# Patient Record
Sex: Female | Born: 1940
Health system: Southern US, Community
[De-identification: ages and names within clinical notes are randomized; demographics above are authoritative.]

## PROBLEM LIST (undated history)

## (undated) DIAGNOSIS — R519 Headache, unspecified: Secondary | ICD-10-CM

## (undated) DIAGNOSIS — F32A Depression, unspecified: Secondary | ICD-10-CM

## (undated) DIAGNOSIS — I712 Thoracic aortic aneurysm, without rupture: Secondary | ICD-10-CM

## (undated) DIAGNOSIS — C801 Malignant (primary) neoplasm, unspecified: Secondary | ICD-10-CM

## (undated) DIAGNOSIS — R51 Headache: Secondary | ICD-10-CM

## (undated) DIAGNOSIS — J309 Allergic rhinitis, unspecified: Secondary | ICD-10-CM

## (undated) DIAGNOSIS — F419 Anxiety disorder, unspecified: Secondary | ICD-10-CM

## (undated) DIAGNOSIS — K219 Gastro-esophageal reflux disease without esophagitis: Secondary | ICD-10-CM

## (undated) DIAGNOSIS — F329 Major depressive disorder, single episode, unspecified: Secondary | ICD-10-CM

## (undated) DIAGNOSIS — M1990 Unspecified osteoarthritis, unspecified site: Secondary | ICD-10-CM

## (undated) DIAGNOSIS — G43909 Migraine, unspecified, not intractable, without status migrainosus: Secondary | ICD-10-CM

## (undated) DIAGNOSIS — I10 Essential (primary) hypertension: Secondary | ICD-10-CM

## (undated) DIAGNOSIS — Z9289 Personal history of other medical treatment: Secondary | ICD-10-CM

## (undated) DIAGNOSIS — S42009A Fracture of unspecified part of unspecified clavicle, initial encounter for closed fracture: Secondary | ICD-10-CM

## (undated) DIAGNOSIS — M810 Age-related osteoporosis without current pathological fracture: Secondary | ICD-10-CM

## (undated) HISTORY — DX: Gastro-esophageal reflux disease without esophagitis: K21.9

## (undated) HISTORY — DX: Depression, unspecified: F32.A

## (undated) HISTORY — DX: Unspecified osteoarthritis, unspecified site: M19.90

## (undated) HISTORY — DX: Depression, unspecified: F41.9

## (undated) HISTORY — DX: Headache: R51

## (undated) HISTORY — DX: Essential (primary) hypertension: I10

## (undated) HISTORY — DX: Malignant (primary) neoplasm, unspecified: C80.1

## (undated) HISTORY — DX: Age-related osteoporosis without current pathological fracture: M81.0

## (undated) HISTORY — DX: Migraine, unspecified, not intractable, without status migrainosus: G43.909

## (undated) HISTORY — DX: Thoracic aortic aneurysm, without rupture: I71.2

## (undated) HISTORY — DX: Allergic rhinitis, unspecified: J30.9

## (undated) HISTORY — DX: Headache, unspecified: R51.9

## (undated) HISTORY — DX: Fracture of unspecified part of unspecified clavicle, initial encounter for closed fracture: S42.009A

## (undated) HISTORY — DX: Major depressive disorder, single episode, unspecified: F32.9

## (undated) HISTORY — DX: Personal history of other medical treatment: Z92.89

---
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
## 1998-07-06 HISTORY — PX: ROTATOR CUFF REPAIR: SHX139

## 2005-04-03 ENCOUNTER — Other Ambulatory Visit: Admission: RE | Admit: 2005-04-03 | Discharge: 2005-04-03 | Payer: Self-pay | Admitting: Family Medicine

## 2005-07-06 HISTORY — PX: TOTAL HIP ARTHROPLASTY: SHX124

## 2013-08-08 DIAGNOSIS — C44319 Basal cell carcinoma of skin of other parts of face: Secondary | ICD-10-CM | POA: Diagnosis not present

## 2013-08-11 DIAGNOSIS — R279 Unspecified lack of coordination: Secondary | ICD-10-CM | POA: Diagnosis not present

## 2013-08-11 DIAGNOSIS — F329 Major depressive disorder, single episode, unspecified: Secondary | ICD-10-CM | POA: Diagnosis not present

## 2013-08-11 DIAGNOSIS — M542 Cervicalgia: Secondary | ICD-10-CM | POA: Diagnosis not present

## 2013-08-11 DIAGNOSIS — M479 Spondylosis, unspecified: Secondary | ICD-10-CM | POA: Diagnosis not present

## 2013-08-11 DIAGNOSIS — I10 Essential (primary) hypertension: Secondary | ICD-10-CM | POA: Diagnosis not present

## 2013-08-11 DIAGNOSIS — F3289 Other specified depressive episodes: Secondary | ICD-10-CM | POA: Diagnosis not present

## 2013-08-11 DIAGNOSIS — Z0000 Encounter for general adult medical examination without abnormal findings: Secondary | ICD-10-CM | POA: Diagnosis not present

## 2013-08-15 DIAGNOSIS — F3289 Other specified depressive episodes: Secondary | ICD-10-CM | POA: Diagnosis not present

## 2013-08-15 DIAGNOSIS — F329 Major depressive disorder, single episode, unspecified: Secondary | ICD-10-CM | POA: Diagnosis not present

## 2013-09-05 DIAGNOSIS — F3289 Other specified depressive episodes: Secondary | ICD-10-CM | POA: Diagnosis not present

## 2013-09-05 DIAGNOSIS — F329 Major depressive disorder, single episode, unspecified: Secondary | ICD-10-CM | POA: Diagnosis not present

## 2013-09-19 DIAGNOSIS — L819 Disorder of pigmentation, unspecified: Secondary | ICD-10-CM | POA: Diagnosis not present

## 2013-09-19 DIAGNOSIS — D235 Other benign neoplasm of skin of trunk: Secondary | ICD-10-CM | POA: Diagnosis not present

## 2013-09-19 DIAGNOSIS — Z85828 Personal history of other malignant neoplasm of skin: Secondary | ICD-10-CM | POA: Diagnosis not present

## 2013-09-27 DIAGNOSIS — F3289 Other specified depressive episodes: Secondary | ICD-10-CM | POA: Diagnosis not present

## 2013-09-27 DIAGNOSIS — F329 Major depressive disorder, single episode, unspecified: Secondary | ICD-10-CM | POA: Diagnosis not present

## 2013-10-09 DIAGNOSIS — R269 Unspecified abnormalities of gait and mobility: Secondary | ICD-10-CM | POA: Diagnosis not present

## 2013-10-09 DIAGNOSIS — E119 Type 2 diabetes mellitus without complications: Secondary | ICD-10-CM | POA: Diagnosis not present

## 2013-10-09 DIAGNOSIS — Z96649 Presence of unspecified artificial hip joint: Secondary | ICD-10-CM | POA: Diagnosis not present

## 2013-10-09 DIAGNOSIS — R29898 Other symptoms and signs involving the musculoskeletal system: Secondary | ICD-10-CM | POA: Diagnosis not present

## 2013-10-09 DIAGNOSIS — I10 Essential (primary) hypertension: Secondary | ICD-10-CM | POA: Diagnosis not present

## 2013-10-09 DIAGNOSIS — G609 Hereditary and idiopathic neuropathy, unspecified: Secondary | ICD-10-CM | POA: Diagnosis not present

## 2013-10-09 DIAGNOSIS — M531 Cervicobrachial syndrome: Secondary | ICD-10-CM | POA: Diagnosis not present

## 2013-10-09 DIAGNOSIS — M25559 Pain in unspecified hip: Secondary | ICD-10-CM | POA: Diagnosis not present

## 2013-10-24 DIAGNOSIS — F329 Major depressive disorder, single episode, unspecified: Secondary | ICD-10-CM | POA: Diagnosis not present

## 2013-10-24 DIAGNOSIS — F3289 Other specified depressive episodes: Secondary | ICD-10-CM | POA: Diagnosis not present

## 2013-11-07 DIAGNOSIS — R42 Dizziness and giddiness: Secondary | ICD-10-CM | POA: Diagnosis not present

## 2013-11-07 DIAGNOSIS — G9009 Other idiopathic peripheral autonomic neuropathy: Secondary | ICD-10-CM | POA: Diagnosis not present

## 2013-11-07 DIAGNOSIS — R269 Unspecified abnormalities of gait and mobility: Secondary | ICD-10-CM | POA: Diagnosis not present

## 2013-11-13 DIAGNOSIS — R42 Dizziness and giddiness: Secondary | ICD-10-CM | POA: Diagnosis not present

## 2013-11-13 DIAGNOSIS — G9009 Other idiopathic peripheral autonomic neuropathy: Secondary | ICD-10-CM | POA: Diagnosis not present

## 2013-11-13 DIAGNOSIS — R269 Unspecified abnormalities of gait and mobility: Secondary | ICD-10-CM | POA: Diagnosis not present

## 2013-11-16 DIAGNOSIS — R269 Unspecified abnormalities of gait and mobility: Secondary | ICD-10-CM | POA: Diagnosis not present

## 2013-11-16 DIAGNOSIS — G9009 Other idiopathic peripheral autonomic neuropathy: Secondary | ICD-10-CM | POA: Diagnosis not present

## 2013-11-16 DIAGNOSIS — R42 Dizziness and giddiness: Secondary | ICD-10-CM | POA: Diagnosis not present

## 2013-11-20 DIAGNOSIS — G9009 Other idiopathic peripheral autonomic neuropathy: Secondary | ICD-10-CM | POA: Diagnosis not present

## 2013-11-20 DIAGNOSIS — R42 Dizziness and giddiness: Secondary | ICD-10-CM | POA: Diagnosis not present

## 2013-11-20 DIAGNOSIS — R269 Unspecified abnormalities of gait and mobility: Secondary | ICD-10-CM | POA: Diagnosis not present

## 2013-11-22 DIAGNOSIS — H353290 Exudative age-related macular degeneration, unspecified eye, stage unspecified: Secondary | ICD-10-CM | POA: Diagnosis not present

## 2013-11-22 DIAGNOSIS — H2510 Age-related nuclear cataract, unspecified eye: Secondary | ICD-10-CM | POA: Diagnosis not present

## 2013-11-22 DIAGNOSIS — H40039 Anatomical narrow angle, unspecified eye: Secondary | ICD-10-CM | POA: Diagnosis not present

## 2013-11-22 DIAGNOSIS — L719 Rosacea, unspecified: Secondary | ICD-10-CM | POA: Diagnosis not present

## 2013-11-22 DIAGNOSIS — F329 Major depressive disorder, single episode, unspecified: Secondary | ICD-10-CM | POA: Diagnosis not present

## 2013-11-22 DIAGNOSIS — F3289 Other specified depressive episodes: Secondary | ICD-10-CM | POA: Diagnosis not present

## 2013-11-23 DIAGNOSIS — R269 Unspecified abnormalities of gait and mobility: Secondary | ICD-10-CM | POA: Diagnosis not present

## 2013-11-23 DIAGNOSIS — R42 Dizziness and giddiness: Secondary | ICD-10-CM | POA: Diagnosis not present

## 2013-11-23 DIAGNOSIS — G9009 Other idiopathic peripheral autonomic neuropathy: Secondary | ICD-10-CM | POA: Diagnosis not present

## 2013-11-28 DIAGNOSIS — G9009 Other idiopathic peripheral autonomic neuropathy: Secondary | ICD-10-CM | POA: Diagnosis not present

## 2013-11-28 DIAGNOSIS — R269 Unspecified abnormalities of gait and mobility: Secondary | ICD-10-CM | POA: Diagnosis not present

## 2013-11-28 DIAGNOSIS — R42 Dizziness and giddiness: Secondary | ICD-10-CM | POA: Diagnosis not present

## 2013-11-29 DIAGNOSIS — H2510 Age-related nuclear cataract, unspecified eye: Secondary | ICD-10-CM | POA: Diagnosis not present

## 2013-11-29 DIAGNOSIS — L719 Rosacea, unspecified: Secondary | ICD-10-CM | POA: Diagnosis not present

## 2013-11-29 DIAGNOSIS — H04129 Dry eye syndrome of unspecified lacrimal gland: Secondary | ICD-10-CM | POA: Diagnosis not present

## 2013-11-29 DIAGNOSIS — H353290 Exudative age-related macular degeneration, unspecified eye, stage unspecified: Secondary | ICD-10-CM | POA: Diagnosis not present

## 2013-11-30 DIAGNOSIS — H353290 Exudative age-related macular degeneration, unspecified eye, stage unspecified: Secondary | ICD-10-CM | POA: Diagnosis not present

## 2013-11-30 DIAGNOSIS — H353190 Nonexudative age-related macular degeneration, unspecified eye, stage unspecified: Secondary | ICD-10-CM | POA: Diagnosis not present

## 2013-12-05 DIAGNOSIS — G9009 Other idiopathic peripheral autonomic neuropathy: Secondary | ICD-10-CM | POA: Diagnosis not present

## 2013-12-05 DIAGNOSIS — R42 Dizziness and giddiness: Secondary | ICD-10-CM | POA: Diagnosis not present

## 2013-12-05 DIAGNOSIS — R269 Unspecified abnormalities of gait and mobility: Secondary | ICD-10-CM | POA: Diagnosis not present

## 2013-12-07 DIAGNOSIS — G9009 Other idiopathic peripheral autonomic neuropathy: Secondary | ICD-10-CM | POA: Diagnosis not present

## 2013-12-07 DIAGNOSIS — R269 Unspecified abnormalities of gait and mobility: Secondary | ICD-10-CM | POA: Diagnosis not present

## 2013-12-07 DIAGNOSIS — R42 Dizziness and giddiness: Secondary | ICD-10-CM | POA: Diagnosis not present

## 2013-12-13 DIAGNOSIS — H04129 Dry eye syndrome of unspecified lacrimal gland: Secondary | ICD-10-CM | POA: Diagnosis not present

## 2013-12-13 DIAGNOSIS — H2510 Age-related nuclear cataract, unspecified eye: Secondary | ICD-10-CM | POA: Diagnosis not present

## 2013-12-13 DIAGNOSIS — L719 Rosacea, unspecified: Secondary | ICD-10-CM | POA: Diagnosis not present

## 2013-12-13 DIAGNOSIS — H353290 Exudative age-related macular degeneration, unspecified eye, stage unspecified: Secondary | ICD-10-CM | POA: Diagnosis not present

## 2013-12-18 DIAGNOSIS — M545 Low back pain, unspecified: Secondary | ICD-10-CM | POA: Diagnosis not present

## 2013-12-18 DIAGNOSIS — E782 Mixed hyperlipidemia: Secondary | ICD-10-CM | POA: Diagnosis not present

## 2013-12-18 DIAGNOSIS — I359 Nonrheumatic aortic valve disorder, unspecified: Secondary | ICD-10-CM | POA: Diagnosis not present

## 2013-12-18 DIAGNOSIS — F329 Major depressive disorder, single episode, unspecified: Secondary | ICD-10-CM | POA: Diagnosis not present

## 2013-12-18 DIAGNOSIS — I10 Essential (primary) hypertension: Secondary | ICD-10-CM | POA: Diagnosis not present

## 2013-12-18 DIAGNOSIS — R42 Dizziness and giddiness: Secondary | ICD-10-CM | POA: Diagnosis not present

## 2013-12-18 DIAGNOSIS — F3289 Other specified depressive episodes: Secondary | ICD-10-CM | POA: Diagnosis not present

## 2013-12-18 DIAGNOSIS — C44319 Basal cell carcinoma of skin of other parts of face: Secondary | ICD-10-CM | POA: Diagnosis not present

## 2013-12-18 DIAGNOSIS — Z0000 Encounter for general adult medical examination without abnormal findings: Secondary | ICD-10-CM | POA: Diagnosis not present

## 2013-12-19 DIAGNOSIS — H353290 Exudative age-related macular degeneration, unspecified eye, stage unspecified: Secondary | ICD-10-CM | POA: Diagnosis not present

## 2013-12-19 DIAGNOSIS — H04129 Dry eye syndrome of unspecified lacrimal gland: Secondary | ICD-10-CM | POA: Diagnosis not present

## 2013-12-19 DIAGNOSIS — H2510 Age-related nuclear cataract, unspecified eye: Secondary | ICD-10-CM | POA: Diagnosis not present

## 2013-12-19 DIAGNOSIS — L719 Rosacea, unspecified: Secondary | ICD-10-CM | POA: Diagnosis not present

## 2013-12-27 DIAGNOSIS — F3289 Other specified depressive episodes: Secondary | ICD-10-CM | POA: Diagnosis not present

## 2013-12-27 DIAGNOSIS — F329 Major depressive disorder, single episode, unspecified: Secondary | ICD-10-CM | POA: Diagnosis not present

## 2013-12-28 DIAGNOSIS — H353290 Exudative age-related macular degeneration, unspecified eye, stage unspecified: Secondary | ICD-10-CM | POA: Diagnosis not present

## 2014-01-04 DIAGNOSIS — H2510 Age-related nuclear cataract, unspecified eye: Secondary | ICD-10-CM | POA: Diagnosis not present

## 2014-01-04 DIAGNOSIS — H353290 Exudative age-related macular degeneration, unspecified eye, stage unspecified: Secondary | ICD-10-CM | POA: Diagnosis not present

## 2014-01-04 DIAGNOSIS — L719 Rosacea, unspecified: Secondary | ICD-10-CM | POA: Diagnosis not present

## 2014-01-04 DIAGNOSIS — H04129 Dry eye syndrome of unspecified lacrimal gland: Secondary | ICD-10-CM | POA: Diagnosis not present

## 2014-01-25 DIAGNOSIS — H353290 Exudative age-related macular degeneration, unspecified eye, stage unspecified: Secondary | ICD-10-CM | POA: Diagnosis not present

## 2014-02-07 DIAGNOSIS — F329 Major depressive disorder, single episode, unspecified: Secondary | ICD-10-CM | POA: Diagnosis not present

## 2014-02-07 DIAGNOSIS — F3289 Other specified depressive episodes: Secondary | ICD-10-CM | POA: Diagnosis not present

## 2014-03-08 DIAGNOSIS — H353290 Exudative age-related macular degeneration, unspecified eye, stage unspecified: Secondary | ICD-10-CM | POA: Diagnosis not present

## 2014-03-14 DIAGNOSIS — F329 Major depressive disorder, single episode, unspecified: Secondary | ICD-10-CM | POA: Diagnosis not present

## 2014-03-14 DIAGNOSIS — F3289 Other specified depressive episodes: Secondary | ICD-10-CM | POA: Diagnosis not present

## 2014-04-10 DIAGNOSIS — F329 Major depressive disorder, single episode, unspecified: Secondary | ICD-10-CM | POA: Diagnosis not present

## 2014-04-16 DIAGNOSIS — M5481 Occipital neuralgia: Secondary | ICD-10-CM | POA: Diagnosis not present

## 2014-04-16 DIAGNOSIS — G629 Polyneuropathy, unspecified: Secondary | ICD-10-CM | POA: Diagnosis not present

## 2014-04-17 DIAGNOSIS — H3532 Exudative age-related macular degeneration: Secondary | ICD-10-CM | POA: Diagnosis not present

## 2014-04-18 DIAGNOSIS — Z08 Encounter for follow-up examination after completed treatment for malignant neoplasm: Secondary | ICD-10-CM | POA: Diagnosis not present

## 2014-04-18 DIAGNOSIS — Z85820 Personal history of malignant melanoma of skin: Secondary | ICD-10-CM | POA: Diagnosis not present

## 2014-06-12 DIAGNOSIS — H3532 Exudative age-related macular degeneration: Secondary | ICD-10-CM | POA: Diagnosis not present

## 2014-06-20 DIAGNOSIS — F329 Major depressive disorder, single episode, unspecified: Secondary | ICD-10-CM | POA: Diagnosis not present

## 2014-07-03 DIAGNOSIS — M5481 Occipital neuralgia: Secondary | ICD-10-CM | POA: Diagnosis not present

## 2014-07-03 DIAGNOSIS — M542 Cervicalgia: Secondary | ICD-10-CM | POA: Diagnosis not present

## 2014-07-03 DIAGNOSIS — I10 Essential (primary) hypertension: Secondary | ICD-10-CM | POA: Diagnosis not present

## 2014-07-03 DIAGNOSIS — R12 Heartburn: Secondary | ICD-10-CM | POA: Diagnosis not present

## 2014-07-03 DIAGNOSIS — Z6829 Body mass index (BMI) 29.0-29.9, adult: Secondary | ICD-10-CM | POA: Diagnosis not present

## 2014-07-03 DIAGNOSIS — F329 Major depressive disorder, single episode, unspecified: Secondary | ICD-10-CM | POA: Diagnosis not present

## 2014-07-03 DIAGNOSIS — G4733 Obstructive sleep apnea (adult) (pediatric): Secondary | ICD-10-CM | POA: Diagnosis not present

## 2014-07-18 DIAGNOSIS — Z681 Body mass index (BMI) 19 or less, adult: Secondary | ICD-10-CM | POA: Diagnosis not present

## 2014-07-18 DIAGNOSIS — F329 Major depressive disorder, single episode, unspecified: Secondary | ICD-10-CM | POA: Diagnosis not present

## 2014-08-08 DIAGNOSIS — F329 Major depressive disorder, single episode, unspecified: Secondary | ICD-10-CM | POA: Diagnosis not present

## 2014-08-08 DIAGNOSIS — Z681 Body mass index (BMI) 19 or less, adult: Secondary | ICD-10-CM | POA: Diagnosis not present

## 2014-08-22 DIAGNOSIS — F329 Major depressive disorder, single episode, unspecified: Secondary | ICD-10-CM | POA: Diagnosis not present

## 2014-08-22 DIAGNOSIS — Z681 Body mass index (BMI) 19 or less, adult: Secondary | ICD-10-CM | POA: Diagnosis not present

## 2014-08-28 DIAGNOSIS — H3532 Exudative age-related macular degeneration: Secondary | ICD-10-CM | POA: Diagnosis not present

## 2014-09-26 DIAGNOSIS — F329 Major depressive disorder, single episode, unspecified: Secondary | ICD-10-CM | POA: Diagnosis not present

## 2014-09-26 DIAGNOSIS — Z681 Body mass index (BMI) 19 or less, adult: Secondary | ICD-10-CM | POA: Diagnosis not present

## 2014-10-18 DIAGNOSIS — M5481 Occipital neuralgia: Secondary | ICD-10-CM | POA: Diagnosis not present

## 2014-10-18 DIAGNOSIS — G609 Hereditary and idiopathic neuropathy, unspecified: Secondary | ICD-10-CM | POA: Diagnosis not present

## 2014-10-18 DIAGNOSIS — Z6830 Body mass index (BMI) 30.0-30.9, adult: Secondary | ICD-10-CM | POA: Diagnosis not present

## 2014-10-24 DIAGNOSIS — F329 Major depressive disorder, single episode, unspecified: Secondary | ICD-10-CM | POA: Diagnosis not present

## 2014-11-20 DIAGNOSIS — H3532 Exudative age-related macular degeneration: Secondary | ICD-10-CM | POA: Diagnosis not present

## 2014-11-28 DIAGNOSIS — F329 Major depressive disorder, single episode, unspecified: Secondary | ICD-10-CM | POA: Diagnosis not present

## 2014-12-26 DIAGNOSIS — F329 Major depressive disorder, single episode, unspecified: Secondary | ICD-10-CM | POA: Diagnosis not present

## 2015-01-04 DIAGNOSIS — I10 Essential (primary) hypertension: Secondary | ICD-10-CM | POA: Diagnosis not present

## 2015-01-04 DIAGNOSIS — M542 Cervicalgia: Secondary | ICD-10-CM | POA: Diagnosis not present

## 2015-01-04 DIAGNOSIS — R12 Heartburn: Secondary | ICD-10-CM | POA: Diagnosis not present

## 2015-01-04 DIAGNOSIS — F329 Major depressive disorder, single episode, unspecified: Secondary | ICD-10-CM | POA: Diagnosis not present

## 2015-01-29 DIAGNOSIS — F329 Major depressive disorder, single episode, unspecified: Secondary | ICD-10-CM | POA: Diagnosis not present

## 2015-03-06 DIAGNOSIS — F329 Major depressive disorder, single episode, unspecified: Secondary | ICD-10-CM | POA: Diagnosis not present

## 2015-03-12 DIAGNOSIS — H3531 Nonexudative age-related macular degeneration: Secondary | ICD-10-CM | POA: Diagnosis not present

## 2015-03-12 DIAGNOSIS — H3532 Exudative age-related macular degeneration: Secondary | ICD-10-CM | POA: Diagnosis not present

## 2015-03-27 DIAGNOSIS — F329 Major depressive disorder, single episode, unspecified: Secondary | ICD-10-CM | POA: Diagnosis not present

## 2015-04-24 DIAGNOSIS — F329 Major depressive disorder, single episode, unspecified: Secondary | ICD-10-CM | POA: Diagnosis not present

## 2015-05-15 DIAGNOSIS — F329 Major depressive disorder, single episode, unspecified: Secondary | ICD-10-CM | POA: Diagnosis not present

## 2015-06-05 DIAGNOSIS — F329 Major depressive disorder, single episode, unspecified: Secondary | ICD-10-CM | POA: Diagnosis not present

## 2015-06-06 DIAGNOSIS — I712 Thoracic aortic aneurysm, without rupture: Secondary | ICD-10-CM

## 2015-06-06 DIAGNOSIS — I7121 Aneurysm of the ascending aorta, without rupture: Secondary | ICD-10-CM

## 2015-06-06 DIAGNOSIS — S42009A Fracture of unspecified part of unspecified clavicle, initial encounter for closed fracture: Secondary | ICD-10-CM

## 2015-06-06 HISTORY — DX: Thoracic aortic aneurysm, without rupture: I71.2

## 2015-06-06 HISTORY — DX: Fracture of unspecified part of unspecified clavicle, initial encounter for closed fracture: S42.009A

## 2015-06-06 HISTORY — DX: Aneurysm of the ascending aorta, without rupture: I71.21

## 2015-06-25 DIAGNOSIS — Z7982 Long term (current) use of aspirin: Secondary | ICD-10-CM | POA: Diagnosis not present

## 2015-06-25 DIAGNOSIS — H3530 Unspecified macular degeneration: Secondary | ICD-10-CM | POA: Diagnosis present

## 2015-06-25 DIAGNOSIS — R42 Dizziness and giddiness: Secondary | ICD-10-CM | POA: Diagnosis not present

## 2015-06-25 DIAGNOSIS — S8001XA Contusion of right knee, initial encounter: Secondary | ICD-10-CM | POA: Diagnosis not present

## 2015-06-25 DIAGNOSIS — S299XXA Unspecified injury of thorax, initial encounter: Secondary | ICD-10-CM | POA: Diagnosis not present

## 2015-06-25 DIAGNOSIS — R9089 Other abnormal findings on diagnostic imaging of central nervous system: Secondary | ICD-10-CM | POA: Diagnosis not present

## 2015-06-25 DIAGNOSIS — I716 Thoracoabdominal aortic aneurysm, without rupture: Secondary | ICD-10-CM | POA: Diagnosis present

## 2015-06-25 DIAGNOSIS — S60413A Abrasion of left middle finger, initial encounter: Secondary | ICD-10-CM | POA: Diagnosis present

## 2015-06-25 DIAGNOSIS — F329 Major depressive disorder, single episode, unspecified: Secondary | ICD-10-CM | POA: Diagnosis not present

## 2015-06-25 DIAGNOSIS — I10 Essential (primary) hypertension: Secondary | ICD-10-CM | POA: Diagnosis not present

## 2015-06-25 DIAGNOSIS — S80219A Abrasion, unspecified knee, initial encounter: Secondary | ICD-10-CM | POA: Diagnosis present

## 2015-06-25 DIAGNOSIS — M47816 Spondylosis without myelopathy or radiculopathy, lumbar region: Secondary | ICD-10-CM | POA: Diagnosis not present

## 2015-06-25 DIAGNOSIS — F419 Anxiety disorder, unspecified: Secondary | ICD-10-CM | POA: Diagnosis present

## 2015-06-25 DIAGNOSIS — S7011XA Contusion of right thigh, initial encounter: Secondary | ICD-10-CM | POA: Diagnosis not present

## 2015-06-25 DIAGNOSIS — M25462 Effusion, left knee: Secondary | ICD-10-CM | POA: Diagnosis not present

## 2015-06-25 DIAGNOSIS — S8002XA Contusion of left knee, initial encounter: Secondary | ICD-10-CM | POA: Diagnosis not present

## 2015-06-25 DIAGNOSIS — I712 Thoracic aortic aneurysm, without rupture: Secondary | ICD-10-CM | POA: Diagnosis not present

## 2015-06-25 DIAGNOSIS — S0085XA Superficial foreign body of other part of head, initial encounter: Secondary | ICD-10-CM | POA: Diagnosis present

## 2015-06-25 DIAGNOSIS — Z8249 Family history of ischemic heart disease and other diseases of the circulatory system: Secondary | ICD-10-CM | POA: Diagnosis not present

## 2015-06-25 DIAGNOSIS — S42001A Fracture of unspecified part of right clavicle, initial encounter for closed fracture: Secondary | ICD-10-CM | POA: Diagnosis not present

## 2015-06-25 DIAGNOSIS — G4730 Sleep apnea, unspecified: Secondary | ICD-10-CM | POA: Diagnosis present

## 2015-06-25 DIAGNOSIS — S4991XA Unspecified injury of right shoulder and upper arm, initial encounter: Secondary | ICD-10-CM | POA: Diagnosis not present

## 2015-06-25 DIAGNOSIS — M5030 Other cervical disc degeneration, unspecified cervical region: Secondary | ICD-10-CM | POA: Diagnosis not present

## 2015-06-25 DIAGNOSIS — T1490 Injury, unspecified: Secondary | ICD-10-CM | POA: Diagnosis not present

## 2015-06-25 DIAGNOSIS — S8992XA Unspecified injury of left lower leg, initial encounter: Secondary | ICD-10-CM | POA: Diagnosis not present

## 2015-06-25 DIAGNOSIS — S199XXA Unspecified injury of neck, initial encounter: Secondary | ICD-10-CM | POA: Diagnosis not present

## 2015-06-25 DIAGNOSIS — Z041 Encounter for examination and observation following transport accident: Secondary | ICD-10-CM | POA: Diagnosis not present

## 2015-06-25 DIAGNOSIS — M19011 Primary osteoarthritis, right shoulder: Secondary | ICD-10-CM | POA: Diagnosis not present

## 2015-06-25 DIAGNOSIS — M7989 Other specified soft tissue disorders: Secondary | ICD-10-CM | POA: Diagnosis not present

## 2015-06-25 DIAGNOSIS — G319 Degenerative disease of nervous system, unspecified: Secondary | ICD-10-CM | POA: Diagnosis not present

## 2015-06-25 DIAGNOSIS — S0990XA Unspecified injury of head, initial encounter: Secondary | ICD-10-CM | POA: Diagnosis not present

## 2015-06-25 DIAGNOSIS — R0902 Hypoxemia: Secondary | ICD-10-CM | POA: Diagnosis not present

## 2015-06-25 DIAGNOSIS — S29009A Unspecified injury of muscle and tendon of unspecified wall of thorax, initial encounter: Secondary | ICD-10-CM | POA: Diagnosis not present

## 2015-06-25 DIAGNOSIS — S22080A Wedge compression fracture of T11-T12 vertebra, initial encounter for closed fracture: Secondary | ICD-10-CM | POA: Diagnosis not present

## 2015-06-25 DIAGNOSIS — M85811 Other specified disorders of bone density and structure, right shoulder: Secondary | ICD-10-CM | POA: Diagnosis not present

## 2015-06-25 DIAGNOSIS — S42031A Displaced fracture of lateral end of right clavicle, initial encounter for closed fracture: Secondary | ICD-10-CM | POA: Diagnosis not present

## 2015-06-25 DIAGNOSIS — S22089A Unspecified fracture of T11-T12 vertebra, initial encounter for closed fracture: Secondary | ICD-10-CM | POA: Diagnosis present

## 2015-06-25 DIAGNOSIS — S8012XA Contusion of left lower leg, initial encounter: Secondary | ICD-10-CM | POA: Diagnosis not present

## 2015-06-25 DIAGNOSIS — Z6828 Body mass index (BMI) 28.0-28.9, adult: Secondary | ICD-10-CM | POA: Diagnosis not present

## 2015-06-25 DIAGNOSIS — S3993XA Unspecified injury of pelvis, initial encounter: Secondary | ICD-10-CM | POA: Diagnosis not present

## 2015-06-25 DIAGNOSIS — M797 Fibromyalgia: Secondary | ICD-10-CM | POA: Diagnosis present

## 2015-06-25 DIAGNOSIS — Z79899 Other long term (current) drug therapy: Secondary | ICD-10-CM | POA: Diagnosis not present

## 2015-06-25 DIAGNOSIS — M5136 Other intervertebral disc degeneration, lumbar region: Secondary | ICD-10-CM | POA: Diagnosis present

## 2015-06-25 DIAGNOSIS — G43909 Migraine, unspecified, not intractable, without status migrainosus: Secondary | ICD-10-CM | POA: Diagnosis present

## 2015-06-25 DIAGNOSIS — Z96641 Presence of right artificial hip joint: Secondary | ICD-10-CM | POA: Diagnosis not present

## 2015-06-25 DIAGNOSIS — M5126 Other intervertebral disc displacement, lumbar region: Secondary | ICD-10-CM | POA: Diagnosis not present

## 2015-07-09 DIAGNOSIS — S5002XA Contusion of left elbow, initial encounter: Secondary | ICD-10-CM | POA: Diagnosis not present

## 2015-07-09 DIAGNOSIS — S8002XA Contusion of left knee, initial encounter: Secondary | ICD-10-CM | POA: Diagnosis not present

## 2015-07-09 DIAGNOSIS — M129 Arthropathy, unspecified: Secondary | ICD-10-CM | POA: Diagnosis not present

## 2015-07-09 DIAGNOSIS — S42001G Fracture of unspecified part of right clavicle, subsequent encounter for fracture with delayed healing: Secondary | ICD-10-CM | POA: Diagnosis not present

## 2015-07-18 DIAGNOSIS — Z6829 Body mass index (BMI) 29.0-29.9, adult: Secondary | ICD-10-CM | POA: Diagnosis not present

## 2015-07-18 DIAGNOSIS — I10 Essential (primary) hypertension: Secondary | ICD-10-CM | POA: Diagnosis not present

## 2015-07-18 DIAGNOSIS — J3089 Other allergic rhinitis: Secondary | ICD-10-CM | POA: Diagnosis not present

## 2015-07-18 DIAGNOSIS — F329 Major depressive disorder, single episode, unspecified: Secondary | ICD-10-CM | POA: Diagnosis not present

## 2015-07-18 DIAGNOSIS — M5481 Occipital neuralgia: Secondary | ICD-10-CM | POA: Diagnosis not present

## 2015-07-18 DIAGNOSIS — S060X1S Concussion with loss of consciousness of 30 minutes or less, sequela: Secondary | ICD-10-CM | POA: Diagnosis not present

## 2015-07-18 DIAGNOSIS — R12 Heartburn: Secondary | ICD-10-CM | POA: Diagnosis not present

## 2015-07-23 DIAGNOSIS — I10 Essential (primary) hypertension: Secondary | ICD-10-CM | POA: Diagnosis not present

## 2015-07-23 DIAGNOSIS — R413 Other amnesia: Secondary | ICD-10-CM | POA: Diagnosis not present

## 2015-07-23 DIAGNOSIS — S060X1S Concussion with loss of consciousness of 30 minutes or less, sequela: Secondary | ICD-10-CM | POA: Diagnosis not present

## 2015-07-24 DIAGNOSIS — F329 Major depressive disorder, single episode, unspecified: Secondary | ICD-10-CM | POA: Diagnosis not present

## 2015-07-30 DIAGNOSIS — H353211 Exudative age-related macular degeneration, right eye, with active choroidal neovascularization: Secondary | ICD-10-CM | POA: Diagnosis not present

## 2015-07-31 DIAGNOSIS — S42001A Fracture of unspecified part of right clavicle, initial encounter for closed fracture: Secondary | ICD-10-CM | POA: Diagnosis not present

## 2015-07-31 DIAGNOSIS — Z6828 Body mass index (BMI) 28.0-28.9, adult: Secondary | ICD-10-CM | POA: Diagnosis not present

## 2015-07-31 DIAGNOSIS — F419 Anxiety disorder, unspecified: Secondary | ICD-10-CM | POA: Diagnosis not present

## 2015-07-31 DIAGNOSIS — J301 Allergic rhinitis due to pollen: Secondary | ICD-10-CM | POA: Diagnosis not present

## 2015-07-31 DIAGNOSIS — Z823 Family history of stroke: Secondary | ICD-10-CM | POA: Diagnosis not present

## 2015-07-31 DIAGNOSIS — Z8349 Family history of other endocrine, nutritional and metabolic diseases: Secondary | ICD-10-CM | POA: Diagnosis not present

## 2015-07-31 DIAGNOSIS — Z888 Allergy status to other drugs, medicaments and biological substances status: Secondary | ICD-10-CM | POA: Diagnosis not present

## 2015-07-31 DIAGNOSIS — Z8249 Family history of ischemic heart disease and other diseases of the circulatory system: Secondary | ICD-10-CM | POA: Diagnosis not present

## 2015-07-31 DIAGNOSIS — I10 Essential (primary) hypertension: Secondary | ICD-10-CM | POA: Diagnosis not present

## 2015-07-31 DIAGNOSIS — Z8679 Personal history of other diseases of the circulatory system: Secondary | ICD-10-CM | POA: Diagnosis not present

## 2015-07-31 DIAGNOSIS — H3530 Unspecified macular degeneration: Secondary | ICD-10-CM | POA: Diagnosis not present

## 2015-07-31 DIAGNOSIS — Z79899 Other long term (current) drug therapy: Secondary | ICD-10-CM | POA: Diagnosis not present

## 2015-07-31 DIAGNOSIS — S8002XA Contusion of left knee, initial encounter: Secondary | ICD-10-CM | POA: Diagnosis not present

## 2015-07-31 DIAGNOSIS — I712 Thoracic aortic aneurysm, without rupture: Secondary | ICD-10-CM | POA: Diagnosis not present

## 2015-07-31 DIAGNOSIS — G4730 Sleep apnea, unspecified: Secondary | ICD-10-CM | POA: Diagnosis not present

## 2015-07-31 DIAGNOSIS — F329 Major depressive disorder, single episode, unspecified: Secondary | ICD-10-CM | POA: Diagnosis not present

## 2015-07-31 DIAGNOSIS — Z9851 Tubal ligation status: Secondary | ICD-10-CM | POA: Diagnosis not present

## 2015-07-31 HISTORY — PX: TRANSTHORACIC ECHOCARDIOGRAM: SHX275

## 2015-08-14 DIAGNOSIS — F4320 Adjustment disorder, unspecified: Secondary | ICD-10-CM | POA: Diagnosis not present

## 2015-08-25 ENCOUNTER — Encounter: Payer: Self-pay | Admitting: Family Medicine

## 2015-08-26 ENCOUNTER — Encounter: Payer: Self-pay | Admitting: Family Medicine

## 2015-08-26 ENCOUNTER — Ambulatory Visit (INDEPENDENT_AMBULATORY_CARE_PROVIDER_SITE_OTHER): Payer: Medicare Other | Admitting: Family Medicine

## 2015-08-26 VITALS — BP 145/111 | HR 98 | Temp 98.5°F | Resp 16 | Ht 62.25 in | Wt 161.5 lb

## 2015-08-26 DIAGNOSIS — I10 Essential (primary) hypertension: Secondary | ICD-10-CM | POA: Diagnosis not present

## 2015-08-26 MED ORDER — LISINOPRIL 10 MG PO TABS: ORAL_TABLET | ORAL | Status: DC
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
## 2015-08-26 NOTE — Progress Notes (Signed)
Pre visit review using our clinic review tool, if applicable. No additional management support is needed unless otherwise documented below in the visit note. 

## 2015-09-04 DIAGNOSIS — F4320 Adjustment disorder, unspecified: Secondary | ICD-10-CM | POA: Diagnosis not present

## 2015-09-18 ENCOUNTER — Ambulatory Visit (INDEPENDENT_AMBULATORY_CARE_PROVIDER_SITE_OTHER): Payer: Medicare Other | Admitting: Family Medicine

## 2015-09-18 ENCOUNTER — Encounter: Payer: Self-pay | Admitting: Family Medicine

## 2015-09-18 VITALS — BP 160/120 | HR 84 | Temp 98.3°F | Resp 16 | Ht 62.25 in | Wt 167.0 lb

## 2015-09-18 DIAGNOSIS — I10 Essential (primary) hypertension: Secondary | ICD-10-CM

## 2015-09-18 LAB — BASIC METABOLIC PANEL
BUN: 24 mg/dL — AB (ref 6–23)
CO2: 31 mEq/L (ref 19–32)
CREATININE: 0.49 mg/dL (ref 0.40–1.20)
Calcium: 9.6 mg/dL (ref 8.4–10.5)
Chloride: 102 mEq/L (ref 96–112)
GFR: 131.14 mL/min (ref >60.00)
GLUCOSE: 98 mg/dL (ref 70–99)
Potassium: 4.4 mEq/L (ref 3.5–5.1)
Sodium: 140 mEq/L (ref 135–145)

## 2015-09-18 MED ORDER — LISINOPRIL 20 MG PO TABS: ORAL_TABLET | ORAL | Status: DC

## 2015-09-18 MED ORDER — METOPROLOL TARTRATE 25 MG PO TABS: ORAL_TABLET | ORAL | Status: DC

## 2015-09-18 NOTE — Progress Notes (Signed)
Pre visit review using our clinic review tool, if applicable. No additional management support is needed unless otherwise documented below in the visit note. 
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
## 2015-09-24 DIAGNOSIS — F4320 Adjustment disorder, unspecified: Secondary | ICD-10-CM | POA: Diagnosis not present

## 2015-10-09 ENCOUNTER — Encounter: Payer: Self-pay | Admitting: Family Medicine

## 2015-10-09 ENCOUNTER — Ambulatory Visit (INDEPENDENT_AMBULATORY_CARE_PROVIDER_SITE_OTHER): Payer: Medicare Other | Admitting: Family Medicine

## 2015-10-09 VITALS — BP 128/85 | HR 82 | Temp 98.6°F | Resp 16 | Ht 62.25 in | Wt 166.2 lb

## 2015-10-09 DIAGNOSIS — I10 Essential (primary) hypertension: Secondary | ICD-10-CM

## 2015-10-09 NOTE — Progress Notes (Signed)
Pre visit review using our clinic review tool, if applicable. No additional management support is needed unless otherwise documented below in the visit note. 
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
## 2015-10-23 DIAGNOSIS — F4320 Adjustment disorder, unspecified: Secondary | ICD-10-CM | POA: Diagnosis not present

## 2015-11-12 ENCOUNTER — Other Ambulatory Visit: Payer: Self-pay

## 2015-11-12 MED ORDER — METOPROLOL TARTRATE 25 MG PO TABS: ORAL_TABLET | ORAL | Status: DC

## 2015-11-13 DIAGNOSIS — F4320 Adjustment disorder, unspecified: Secondary | ICD-10-CM | POA: Diagnosis not present

## 2015-11-26 DIAGNOSIS — H353211 Exudative age-related macular degeneration, right eye, with active choroidal neovascularization: Secondary | ICD-10-CM | POA: Diagnosis not present

## 2015-11-26 DIAGNOSIS — H353122 Nonexudative age-related macular degeneration, left eye, intermediate dry stage: Secondary | ICD-10-CM | POA: Diagnosis not present

## 2015-12-04 DIAGNOSIS — F4320 Adjustment disorder, unspecified: Secondary | ICD-10-CM | POA: Diagnosis not present

## 2016-01-01 DIAGNOSIS — F4320 Adjustment disorder, unspecified: Secondary | ICD-10-CM | POA: Diagnosis not present

## 2016-01-13 ENCOUNTER — Other Ambulatory Visit: Payer: Self-pay | Admitting: *Deleted

## 2016-01-13 MED ORDER — METOPROLOL TARTRATE 25 MG PO TABS: ORAL_TABLET | ORAL | Status: DC

## 2016-01-13 NOTE — Telephone Encounter (Signed)
RF request for metoprolol LOV: 10/09/15 Next ov: 04/09/16 Last written: 11/12/15 #30 w/ 1RF

## 2016-01-21 ENCOUNTER — Ambulatory Visit (INDEPENDENT_AMBULATORY_CARE_PROVIDER_SITE_OTHER): Payer: Medicare Other | Admitting: Family Medicine

## 2016-01-21 ENCOUNTER — Encounter: Payer: Self-pay | Admitting: Family Medicine

## 2016-01-21 VITALS — BP 139/77 | HR 60 | Temp 98.9°F | Resp 16 | Ht 62.25 in | Wt 168.5 lb

## 2016-01-21 DIAGNOSIS — I10 Essential (primary) hypertension: Secondary | ICD-10-CM

## 2016-01-21 MED ORDER — METOPROLOL TARTRATE 25 MG PO TABS: ORAL_TABLET | ORAL | Status: DC

## 2016-01-21 MED ORDER — HYDROCODONE-ACETAMINOPHEN 5-325 MG PO TABS: 1.0000 | ORAL_TABLET | Freq: Four times a day (QID) | ORAL | Status: DC | PRN
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
## 2016-01-21 NOTE — Progress Notes (Signed)
Pre visit review using our clinic review tool, if applicable. No additional management support is needed unless otherwise documented below in the visit note. 

## 2016-01-29 DIAGNOSIS — F4320 Adjustment disorder, unspecified: Secondary | ICD-10-CM | POA: Diagnosis not present

## 2016-02-27 DIAGNOSIS — F329 Major depressive disorder, single episode, unspecified: Secondary | ICD-10-CM | POA: Diagnosis not present

## 2016-02-27 DIAGNOSIS — Z6829 Body mass index (BMI) 29.0-29.9, adult: Secondary | ICD-10-CM | POA: Diagnosis not present

## 2016-02-27 DIAGNOSIS — J9811 Atelectasis: Secondary | ICD-10-CM | POA: Diagnosis not present

## 2016-02-27 DIAGNOSIS — Z8249 Family history of ischemic heart disease and other diseases of the circulatory system: Secondary | ICD-10-CM | POA: Diagnosis not present

## 2016-02-27 DIAGNOSIS — Z79899 Other long term (current) drug therapy: Secondary | ICD-10-CM | POA: Diagnosis not present

## 2016-02-27 DIAGNOSIS — Z888 Allergy status to other drugs, medicaments and biological substances status: Secondary | ICD-10-CM | POA: Diagnosis not present

## 2016-02-27 DIAGNOSIS — I10 Essential (primary) hypertension: Secondary | ICD-10-CM | POA: Diagnosis not present

## 2016-02-27 DIAGNOSIS — I712 Thoracic aortic aneurysm, without rupture: Secondary | ICD-10-CM | POA: Diagnosis not present

## 2016-03-04 DIAGNOSIS — F4320 Adjustment disorder, unspecified: Secondary | ICD-10-CM | POA: Diagnosis not present

## 2016-03-16 DIAGNOSIS — Z6829 Body mass index (BMI) 29.0-29.9, adult: Secondary | ICD-10-CM | POA: Diagnosis not present

## 2016-03-16 DIAGNOSIS — I10 Essential (primary) hypertension: Secondary | ICD-10-CM | POA: Diagnosis not present

## 2016-03-16 DIAGNOSIS — I712 Thoracic aortic aneurysm, without rupture: Secondary | ICD-10-CM | POA: Diagnosis not present

## 2016-03-24 DIAGNOSIS — H353211 Exudative age-related macular degeneration, right eye, with active choroidal neovascularization: Secondary | ICD-10-CM | POA: Diagnosis not present

## 2016-04-01 DIAGNOSIS — F4320 Adjustment disorder, unspecified: Secondary | ICD-10-CM | POA: Diagnosis not present

## 2016-04-09 ENCOUNTER — Ambulatory Visit: Payer: Medicare Other | Admitting: Family Medicine

## 2016-04-15 DIAGNOSIS — I10 Essential (primary) hypertension: Secondary | ICD-10-CM | POA: Diagnosis not present

## 2016-04-15 DIAGNOSIS — Z6831 Body mass index (BMI) 31.0-31.9, adult: Secondary | ICD-10-CM | POA: Diagnosis not present

## 2016-04-28 DIAGNOSIS — F4320 Adjustment disorder, unspecified: Secondary | ICD-10-CM | POA: Diagnosis not present

## 2016-05-13 DIAGNOSIS — I358 Other nonrheumatic aortic valve disorders: Secondary | ICD-10-CM | POA: Diagnosis not present

## 2016-05-13 DIAGNOSIS — Z6829 Body mass index (BMI) 29.0-29.9, adult: Secondary | ICD-10-CM | POA: Diagnosis not present

## 2016-05-13 DIAGNOSIS — I10 Essential (primary) hypertension: Secondary | ICD-10-CM | POA: Diagnosis not present

## 2016-05-26 DIAGNOSIS — F4320 Adjustment disorder, unspecified: Secondary | ICD-10-CM | POA: Diagnosis not present

## 2016-06-25 ENCOUNTER — Other Ambulatory Visit: Payer: Self-pay | Admitting: *Deleted

## 2016-06-25 MED ORDER — LISINOPRIL 20 MG PO TABS
ORAL_TABLET | ORAL | 1 refills | Status: DC
Start: 2016-06-25 — End: 2017-06-03

## 2016-06-25 NOTE — Telephone Encounter (Signed)
RF request for lisinopril requesting 90 day supply. LOV: 01/21/16 Next ov: None Last written: 09/18/15 #60 w/ 11RF

## 2017-06-03 ENCOUNTER — Encounter (HOSPITAL_BASED_OUTPATIENT_CLINIC_OR_DEPARTMENT_OTHER): Payer: Self-pay | Admitting: *Deleted

## 2017-06-04 ENCOUNTER — Ambulatory Visit: Payer: Self-pay | Admitting: Plastic Surgery

## 2017-06-04 DIAGNOSIS — M952 Other acquired deformity of head: Secondary | ICD-10-CM

## 2017-06-05 HISTORY — PX: OTHER SURGICAL HISTORY: SHX169

## 2017-06-09 ENCOUNTER — Encounter (HOSPITAL_BASED_OUTPATIENT_CLINIC_OR_DEPARTMENT_OTHER)
Admission: RE | Disposition: A | Discharge status: Home / Self Care | Payer: Self-pay | Source: Ambulatory Visit | Attending: Plastic Surgery

## 2017-06-09 ENCOUNTER — Encounter (HOSPITAL_BASED_OUTPATIENT_CLINIC_OR_DEPARTMENT_OTHER): Payer: Self-pay | Admitting: Anesthesiology

## 2017-06-09 ENCOUNTER — Other Ambulatory Visit: Payer: Self-pay

## 2017-06-09 ENCOUNTER — Ambulatory Visit (HOSPITAL_BASED_OUTPATIENT_CLINIC_OR_DEPARTMENT_OTHER)
Admission: RE | Admit: 2017-06-09 | Discharge: 2017-06-10 | Disposition: A | Discharge status: Home / Self Care | Payer: Medicare Other | Source: Ambulatory Visit | Attending: Plastic Surgery | Admitting: Plastic Surgery

## 2017-06-09 ENCOUNTER — Ambulatory Visit (HOSPITAL_BASED_OUTPATIENT_CLINIC_OR_DEPARTMENT_OTHER): Payer: Medicare Other | Admitting: Anesthesiology

## 2017-06-09 DIAGNOSIS — Z85828 Personal history of other malignant neoplasm of skin: Secondary | ICD-10-CM | POA: Insufficient documentation

## 2017-06-09 DIAGNOSIS — K219 Gastro-esophageal reflux disease without esophagitis: Secondary | ICD-10-CM | POA: Diagnosis not present

## 2017-06-09 DIAGNOSIS — C4490 Unspecified malignant neoplasm of skin, unspecified: Secondary | ICD-10-CM | POA: Diagnosis present

## 2017-06-09 DIAGNOSIS — M810 Age-related osteoporosis without current pathological fracture: Secondary | ICD-10-CM | POA: Diagnosis not present

## 2017-06-09 DIAGNOSIS — Z79899 Other long term (current) drug therapy: Secondary | ICD-10-CM | POA: Insufficient documentation

## 2017-06-09 DIAGNOSIS — Z96641 Presence of right artificial hip joint: Secondary | ICD-10-CM | POA: Insufficient documentation

## 2017-06-09 DIAGNOSIS — M952 Other acquired deformity of head: Secondary | ICD-10-CM | POA: Diagnosis not present

## 2017-06-09 DIAGNOSIS — I10 Essential (primary) hypertension: Secondary | ICD-10-CM | POA: Insufficient documentation

## 2017-06-09 HISTORY — PX: NASAL FLAP ROTATION: SHX5366

## 2017-06-09 LAB — POCT I-STAT, CHEM 8
BUN: 33 mg/dL — ABNORMAL HIGH (ref 6–20)
CALCIUM ION: 1.24 mmol/L (ref 1.15–1.40)
CHLORIDE: 100 mmol/L — AB (ref 101–111)
Creatinine, Ser: 1 mg/dL (ref 0.44–1.00)
Glucose, Bld: 106 mg/dL — ABNORMAL HIGH (ref 65–99)
HEMATOCRIT: 42 % (ref 36.0–46.0)
Hemoglobin: 14.3 g/dL (ref 12.0–15.0)
Potassium: 4.1 mmol/L (ref 3.5–5.1)
SODIUM: 139 mmol/L (ref 135–145)
TCO2: 30 mmol/L (ref 22–32)

## 2017-06-09 SURGERY — CREATION, FLAP, PEDICLED ROTATION, FOREHEAD AND NOSE
Anesthesia: General | Site: Face | Laterality: Left

## 2017-06-09 MED ORDER — ROCURONIUM BROMIDE 100 MG/10ML IV SOLN
INTRAVENOUS | Status: DC | PRN
  Administered 2017-06-09: 30 mg via INTRAVENOUS

## 2017-06-09 MED ORDER — DIPHENHYDRAMINE HCL 50 MG/ML IJ SOLN: 12.5000 mg | Freq: Four times a day (QID) | INTRAMUSCULAR | Status: DC | PRN

## 2017-06-09 MED ORDER — MEPERIDINE HCL 25 MG/ML IJ SOLN: 6.2500 mg | INTRAMUSCULAR | Status: DC | PRN

## 2017-06-09 MED ORDER — SENNA 8.6 MG PO TABS
1.0000 | ORAL_TABLET | Freq: Two times a day (BID) | ORAL | Status: DC
  Administered 2017-06-09: 8.6 mg via ORAL
  Filled 2017-06-09: qty 1

## 2017-06-09 MED ORDER — DIPHENHYDRAMINE HCL 12.5 MG/5ML PO ELIX: 12.5000 mg | ORAL_SOLUTION | Freq: Four times a day (QID) | ORAL | Status: DC | PRN

## 2017-06-09 MED ORDER — ACETAMINOPHEN 500 MG PO TABS
500.0000 mg | ORAL_TABLET | Freq: Four times a day (QID) | ORAL | Status: DC
  Administered 2017-06-09: 500 mg via ORAL
  Filled 2017-06-09: qty 1

## 2017-06-09 MED ORDER — FENTANYL CITRATE (PF) 100 MCG/2ML IJ SOLN
50.0000 ug | INTRAMUSCULAR | Status: DC | PRN
  Administered 2017-06-09: 50 ug via INTRAVENOUS
  Administered 2017-06-09: 100 ug via INTRAVENOUS

## 2017-06-09 MED ORDER — TOBRAMYCIN-DEXAMETHASONE 0.3-0.1 % OP OINT
TOPICAL_OINTMENT | OPHTHALMIC | Status: AC
  Filled 2017-06-09: qty 3.5

## 2017-06-09 MED ORDER — HYDROCODONE-ACETAMINOPHEN 5-325 MG PO TABS
1.0000 | ORAL_TABLET | ORAL | Status: DC | PRN
  Administered 2017-06-09 – 2017-06-10 (×3): 1 via ORAL
  Filled 2017-06-09 (×4): qty 1

## 2017-06-09 MED ORDER — TRIAMTERENE-HCTZ 37.5-25 MG PO TABS: 1.0000 | ORAL_TABLET | Freq: Every day | ORAL | Status: DC

## 2017-06-09 MED ORDER — SUGAMMADEX SODIUM 200 MG/2ML IV SOLN
INTRAVENOUS | Status: DC | PRN
  Administered 2017-06-09: 200 mg via INTRAVENOUS

## 2017-06-09 MED ORDER — BUPIVACAINE-EPINEPHRINE 0.25% -1:200000 IJ SOLN
INTRAMUSCULAR | Status: DC | PRN
  Administered 2017-06-09: 2.5 mL

## 2017-06-09 MED ORDER — SUCCINYLCHOLINE CHLORIDE 20 MG/ML IJ SOLN
INTRAMUSCULAR | Status: DC | PRN
  Administered 2017-06-09: 100 mg via INTRAVENOUS

## 2017-06-09 MED ORDER — PROPOFOL 10 MG/ML IV BOLUS
INTRAVENOUS | Status: DC | PRN
  Administered 2017-06-09: 160 mg via INTRAVENOUS
  Administered 2017-06-09: 40 mg via INTRAVENOUS

## 2017-06-09 MED ORDER — ONDANSETRON HCL 4 MG/2ML IJ SOLN
INTRAMUSCULAR | Status: DC | PRN
  Administered 2017-06-09: 4 mg via INTRAVENOUS

## 2017-06-09 MED ORDER — ONDANSETRON 4 MG PO TBDP: 4.0000 mg | ORAL_TABLET | Freq: Four times a day (QID) | ORAL | Status: DC | PRN

## 2017-06-09 MED ORDER — MIDAZOLAM HCL 2 MG/2ML IJ SOLN: 1.0000 mg | INTRAMUSCULAR | Status: DC | PRN

## 2017-06-09 MED ORDER — CEFAZOLIN SODIUM-DEXTROSE 2-4 GM/100ML-% IV SOLN
INTRAVENOUS | Status: AC
  Filled 2017-06-09: qty 100

## 2017-06-09 MED ORDER — LACTATED RINGERS IV SOLN
INTRAVENOUS | Status: DC
  Administered 2017-06-09: 14:00:00 via INTRAVENOUS

## 2017-06-09 MED ORDER — KCL IN DEXTROSE-NACL 20-5-0.45 MEQ/L-%-% IV SOLN
INTRAVENOUS | Status: DC
  Administered 2017-06-09: 18:00:00 via INTRAVENOUS
  Filled 2017-06-09: qty 1000

## 2017-06-09 MED ORDER — METOCLOPRAMIDE HCL 5 MG/ML IJ SOLN: 10.0000 mg | Freq: Once | INTRAMUSCULAR | Status: DC | PRN

## 2017-06-09 MED ORDER — ONDANSETRON HCL 4 MG/2ML IJ SOLN: 4.0000 mg | Freq: Four times a day (QID) | INTRAMUSCULAR | Status: DC | PRN

## 2017-06-09 MED ORDER — CARVEDILOL 3.125 MG PO TABS: 3.1250 mg | ORAL_TABLET | Freq: Two times a day (BID) | ORAL | Status: DC

## 2017-06-09 MED ORDER — NAPROXEN 500 MG PO TABS: 500.0000 mg | ORAL_TABLET | Freq: Two times a day (BID) | ORAL | Status: DC | PRN

## 2017-06-09 MED ORDER — CEFAZOLIN SODIUM-DEXTROSE 2-4 GM/100ML-% IV SOLN
2.0000 g | Freq: Three times a day (TID) | INTRAVENOUS | Status: DC
  Administered 2017-06-09 – 2017-06-10 (×2): 2 g via INTRAVENOUS
  Filled 2017-06-09 (×2): qty 100

## 2017-06-09 MED ORDER — SCOPOLAMINE 1 MG/3DAYS TD PT72: 1.0000 | MEDICATED_PATCH | Freq: Once | TRANSDERMAL | Status: DC | PRN

## 2017-06-09 MED ORDER — FENTANYL CITRATE (PF) 100 MCG/2ML IJ SOLN: 25.0000 ug | INTRAMUSCULAR | Status: DC | PRN

## 2017-06-09 MED ORDER — DEXAMETHASONE SODIUM PHOSPHATE 4 MG/ML IJ SOLN
INTRAMUSCULAR | Status: DC | PRN
  Administered 2017-06-09: 10 mg via INTRAVENOUS

## 2017-06-09 MED ORDER — CEFAZOLIN SODIUM-DEXTROSE 2-4 GM/100ML-% IV SOLN
2.0000 g | INTRAVENOUS | Status: AC
  Administered 2017-06-09: 2 g via INTRAVENOUS

## 2017-06-09 MED ORDER — PHENOL 1.4 % MT LIQD
1.0000 | OROMUCOSAL | Status: DC | PRN
  Filled 2017-06-09: qty 177

## 2017-06-09 MED ORDER — PHENYLEPHRINE HCL 10 MG/ML IJ SOLN
INTRAMUSCULAR | Status: DC | PRN
  Administered 2017-06-09: 80 ug via INTRAVENOUS
  Administered 2017-06-09: 40 ug via INTRAVENOUS
  Administered 2017-06-09 (×3): 80 ug via INTRAVENOUS

## 2017-06-09 MED ORDER — HYDROMORPHONE HCL 1 MG/ML IJ SOLN: 1.0000 mg | INTRAMUSCULAR | Status: DC | PRN

## 2017-06-09 SURGICAL SUPPLY — 48 items
ADH SKN CLS APL DERMABOND .7 (GAUZE/BANDAGES/DRESSINGS)
BALL CTTN LRG ABS STRL LF (GAUZE/BANDAGES/DRESSINGS)
BLADE HEX COATED 2.75 (ELECTRODE) IMPLANT
CANISTER SUCT 1200ML W/VALVE (MISCELLANEOUS) ×3 IMPLANT
CLOSURE WOUND 1/2 X4 (GAUZE/BANDAGES/DRESSINGS)
COTTONBALL LRG STERILE PKG (GAUZE/BANDAGES/DRESSINGS) IMPLANT
DECANTER SPIKE VIAL GLASS SM (MISCELLANEOUS) IMPLANT
DERMABOND ADVANCED (GAUZE/BANDAGES/DRESSINGS)
DERMABOND ADVANCED .7 DNX12 (GAUZE/BANDAGES/DRESSINGS) IMPLANT
ELECT COATED BLADE 2.86 ST (ELECTRODE) IMPLANT
ELECT NDL BLADE 2-5/6 (NEEDLE) IMPLANT
ELECT NEEDLE BLADE 2-5/6 (NEEDLE) ×3 IMPLANT
ELECT REM PT RETURN 9FT ADLT (ELECTROSURGICAL) ×3
ELECTRODE REM PT RTRN 9FT ADLT (ELECTROSURGICAL) ×1 IMPLANT
GAUZE VASELINE FOILPK 1/2 X 72 (GAUZE/BANDAGES/DRESSINGS) IMPLANT
GAUZE XEROFORM 1X8 LF (GAUZE/BANDAGES/DRESSINGS) IMPLANT
GAUZE XEROFORM 5X9 LF (GAUZE/BANDAGES/DRESSINGS) IMPLANT
GLOVE BIO SURGEON STRL SZ 6.5 (GLOVE) ×4 IMPLANT
GLOVE BIO SURGEONS STRL SZ 6.5 (GLOVE) ×2
GOWN STRL REUS W/ TWL LRG LVL3 (GOWN DISPOSABLE) ×2 IMPLANT
GOWN STRL REUS W/TWL LRG LVL3 (GOWN DISPOSABLE) ×6
NDL PRECISIONGLIDE 27X1.5 (NEEDLE) ×1 IMPLANT
NEEDLE PRECISIONGLIDE 27X1.5 (NEEDLE) ×3 IMPLANT
PACK BASIN DAY SURGERY FS (CUSTOM PROCEDURE TRAY) ×3 IMPLANT
PACK ENT DAY SURGERY (CUSTOM PROCEDURE TRAY) ×3 IMPLANT
PATTIES SURGICAL .5 X3 (DISPOSABLE) IMPLANT
PENCIL BUTTON HOLSTER BLD 10FT (ELECTRODE) ×3 IMPLANT
SLEEVE SCD COMPRESS KNEE MED (MISCELLANEOUS) ×3 IMPLANT
SPONGE GAUZE 2X2 8PLY STER LF (GAUZE/BANDAGES/DRESSINGS)
SPONGE GAUZE 2X2 8PLY STRL LF (GAUZE/BANDAGES/DRESSINGS) IMPLANT
STRIP CLOSURE SKIN 1/2X4 (GAUZE/BANDAGES/DRESSINGS) IMPLANT
SUT MNCRL 6-0 UNDY P1 1X18 (SUTURE) ×1 IMPLANT
SUT MNCRL AB 3-0 PS2 18 (SUTURE) IMPLANT
SUT MNCRL AB 4-0 PS2 18 (SUTURE) ×11 IMPLANT
SUT MON AB 4-0 PC3 18 (SUTURE) IMPLANT
SUT MON AB 5-0 P3 18 (SUTURE) IMPLANT
SUT MON AB 5-0 PS2 18 (SUTURE) ×17 IMPLANT
SUT MONOCRYL 6-0 P1 1X18 (SUTURE) ×2
SUT PROLENE 5 0 P 3 (SUTURE) IMPLANT
SUT PROLENE 5 0 PS 2 (SUTURE) IMPLANT
SUT PROLENE 6 0 P 1 18 (SUTURE) IMPLANT
SUT VIC AB 5-0 P-3 18X BRD (SUTURE) IMPLANT
SUT VIC AB 5-0 P3 18 (SUTURE) ×21
SUT VIC AB 5-0 PS2 18 (SUTURE) ×4 IMPLANT
SUT VICRYL 4-0 PS2 18IN ABS (SUTURE) ×6 IMPLANT
SYR BULB 3OZ (MISCELLANEOUS) IMPLANT
TOWEL OR 17X24 6PK STRL BLUE (TOWEL DISPOSABLE) ×5 IMPLANT
TRAY DSU PREP LF (CUSTOM PROCEDURE TRAY) ×3 IMPLANT

## 2017-06-09 NOTE — Brief Op Note (Signed)
06/09/2017  5:32 PM  PATIENT:  [name]  75 y.o. female  PRE-OPERATIVE DIAGNOSIS:  SKIN CANCER OF FACE  POST-OPERATIVE DIAGNOSIS:  SKIN CANCER OF FACE  PROCEDURE:  Procedure(s): LEFT CHEEK ROTATION ADVANCEMENT FLAP WITH SKIN GRAFT FOR MOHS DEFECT RECONSTRUCTION (Left)  SURGEON:  Surgeon(s) and Role:    * Terrisha Lopata, Loel Lofty, DO - Primary  PHYSICIAN ASSISTANT:   ASSISTANTS: none   ANESTHESIA:   general  EBL:  50 mL   BLOOD ADMINISTERED:none  DRAINS: Penrose drain in the left cheek   LOCAL MEDICATIONS USED:  LIDOCAINE   SPECIMEN:  No Specimen  DISPOSITION OF SPECIMEN:  N/A  COUNTS:  YES  TOURNIQUET:  * No tourniquets in log *  DICTATION: .Dragon Dictation  PLAN OF CARE: Admit for overnight observation  PATIENT DISPOSITION:  PACU - hemodynamically stable.   Delay start of Pharmacological VTE agent (>24hrs) due to surgical blood loss or risk of bleeding: no

## 2017-06-09 NOTE — Op Note (Signed)
DATE OF OPERATION: 06/09/2017  LOCATION: [facility]  PREOPERATIVE DIAGNOSIS: Left Cheek mohs defect after skin cancer resection 5 x 10 cm  POSTOPERATIVE DIAGNOSIS: Same  PROCEDURE: 1. Left cheek advancement flap for repair of 5 x 10 cm mohs defect  2. Full thickness skin graft 2.5 x 6 cm to left lower eyelid.  SURGEON: Shanteria Laye Sanger Fanta Wimberley, DO  EBL: 10 cc  CONDITION: Stable  COMPLICATIONS: None  INDICATION: The patient, [name], is a 76 y.o. female born on [birth year], is here for treatment of a left cheek defect after resection of a skin cancer by moh's technique.   PROCEDURE DETAILS:  The patient was seen prior to surgery and marked.  The IV antibiotics were given. The patient was taken to the operating room and given a general anesthetic. A standard time out was performed and all information was confirmed by those in the room. SCDs were placed.   The face prepped and draped in the usual sterile fashion.  Local was injected around the cheek for intraoperative hemostasis and postoperative pain control. The #15 blade was used to make an incision at the lower eye lid from the medial aspect to the zygomatic line and preauricular area.  A cheek flap was then lifted and the flap rotated into the lower portion of the nasolabial 5 x 6 cm defect. This was secured in place with 5-0 and 6-0 Monocryl.  The lateral cheek and preauricular area was closed with the 4-0 Monocryl.    The left subclavicular area was marked and injected with local.  The #10 blade was used to obtain a full thickness skin graft 2.5 x 6 cm in size.  The defect was then closed with the 4-0 and 5-0 Monocryl in a layered closure.  Derma bone was applied with a sterile dressing.  The graft was de-fated and placed on the lower left eye lid.  This was bolstered into place with the 5-0 Vicryl and tied over a xeroform dressing.  A penrose drain was placed into the nasolabial area and sutured in place with a 4-0  Monocryl.  A sterile dressing was applied. The patient was allowed to wake up and taken to recovery room in stable condition at the end of the case. The family was notified at the end of the case.
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
## 2017-06-09 NOTE — Anesthesia Procedure Notes (Signed)
Procedure Name: Intubation Date/Time: 06/09/2017 3:08 PM Performed by: Maryella Shivers, CRNA Pre-anesthesia Checklist: Patient identified, Emergency Drugs available, Suction available and Patient being monitored Patient Re-evaluated:Patient Re-evaluated prior to induction Oxygen Delivery Method: Circle system utilized Preoxygenation: Pre-oxygenation with 100% oxygen Induction Type: IV induction Ventilation: Mask ventilation without difficulty Laryngoscope Size: Mac and 3 Grade View: Grade II Tube type: Oral Tube size: 7.0 mm Number of attempts: 1 Airway Equipment and Method: Stylet and Oral airway Placement Confirmation: ETT inserted through vocal cords under direct vision,  positive ETCO2 and breath sounds checked- equal and bilateral Secured at: 20 cm Tube secured with: Tape Dental Injury: Teeth and Oropharynx as per pre-operative assessment

## 2017-06-09 NOTE — Anesthesia Preprocedure Evaluation (Signed)
Anesthesia Evaluation  Patient identified by MRN, date of birth, ID band Patient awake    Reviewed: Allergy & Precautions, NPO status , Patient's Chart, lab work & pertinent test results  Airway Mallampati: II  TM Distance: >3 FB Neck ROM: Full    Dental no notable dental hx. (+) Caps   Pulmonary neg pulmonary ROS,    Pulmonary exam normal breath sounds clear to auscultation       Cardiovascular hypertension (took lisinopril this AM), Pt. on medications Normal cardiovascular exam Rhythm:Regular Rate:Normal     Neuro/Psych negative neurological ROS  negative psych ROS   GI/Hepatic Neg liver ROS, GERD  Poorly Controlled,  Endo/Other  negative endocrine ROS  Renal/GU negative Renal ROS  negative genitourinary   Musculoskeletal negative musculoskeletal ROS (+)   Abdominal   Peds negative pediatric ROS (+)  Hematology negative hematology ROS (+)   Anesthesia Other Findings   Reproductive/Obstetrics negative OB ROS                             Anesthesia Physical Anesthesia Plan  ASA: II  Anesthesia Plan: General   Post-op Pain Management:    Induction: Intravenous  PONV Risk Score and Plan: 3 and Ondansetron, Dexamethasone and Treatment may vary due to age or medical condition  Airway Management Planned: Oral ETT  Additional Equipment:   Intra-op Plan:   Post-operative Plan: Extubation in OR  Informed Consent: I have reviewed the patients History and Physical, chart, labs and discussed the procedure including the risks, benefits and alternatives for the proposed anesthesia with the patient or authorized representative who has indicated his/her understanding and acceptance.   Dental advisory given  Plan Discussed with: CRNA  Anesthesia Plan Comments:         Anesthesia Quick Evaluation

## 2017-06-10 ENCOUNTER — Encounter (HOSPITAL_BASED_OUTPATIENT_CLINIC_OR_DEPARTMENT_OTHER): Payer: Self-pay | Admitting: Plastic Surgery

## 2017-06-10 DIAGNOSIS — M952 Other acquired deformity of head: Secondary | ICD-10-CM | POA: Diagnosis not present

## 2017-06-10 MED ORDER — HYDROCODONE-ACETAMINOPHEN 5-325 MG PO TABS: 1.0000 | ORAL_TABLET | Freq: Four times a day (QID) | ORAL | 0 refills | Status: DC | PRN

## 2017-06-10 NOTE — Anesthesia Postprocedure Evaluation (Signed)
Anesthesia Post Note  Patient: Elmarie Shiley  Procedure(s) Performed: LEFT CHEEK ROTATION ADVANCEMENT FLAP WITH SKIN GRAFT FOR MOHS DEFECT RECONSTRUCTION (Left Face)     Patient location during evaluation: PACU Anesthesia Type: General Level of consciousness: awake and alert Pain management: pain level controlled Vital Signs Assessment: post-procedure vital signs reviewed and stable Respiratory status: spontaneous breathing, nonlabored ventilation, respiratory function stable and patient connected to nasal cannula oxygen Cardiovascular status: blood pressure returned to baseline and stable Postop Assessment: no apparent nausea or vomiting Anesthetic complications: no    Last Vitals:  Vitals:   06/10/17 0645 06/10/17 0830  BP: 121/72   Pulse: 74   Resp: 16   Temp: 36.7 C   SpO2: 97% 98%    Last Pain:  Vitals:   06/10/17 0900  TempSrc:   PainSc: 0-No pain                 Montez Hageman

## 2017-06-10 NOTE — Transfer of Care (Signed)
Immediate Anesthesia Transfer of Care Note  Patient: [name]  Procedure(s) Performed: LEFT CHEEK ROTATION ADVANCEMENT FLAP WITH SKIN GRAFT FOR MOHS DEFECT RECONSTRUCTION (Left Face)  Patient Location: PACU  Anesthesia Type:General  Level of Consciousness: awake, alert  and oriented  Airway & Oxygen Therapy: Patient Spontanous Breathing and Patient connected to face mask oxygen  Post-op Assessment: Report given to RN and Post -op Vital signs reviewed and stable  Post vital signs: Reviewed and stable  Last Vitals:  Vitals:   06/10/17 0630 06/10/17 0645  BP:  121/72  Pulse:  74  Resp:  16  Temp:  36.7 C  SpO2: 97% 97%    Last Pain:  Vitals:   06/10/17 0645  TempSrc:   PainSc: 2          Complications: No apparent anesthesia complications

## 2017-07-12 ENCOUNTER — Ambulatory Visit (INDEPENDENT_AMBULATORY_CARE_PROVIDER_SITE_OTHER): Payer: Medicare Other | Admitting: Family Medicine

## 2017-07-12 ENCOUNTER — Encounter: Payer: Self-pay | Admitting: Family Medicine

## 2017-07-12 VITALS — BP 127/82 | HR 92 | Temp 98.7°F | Resp 16 | Ht 62.25 in | Wt 175.5 lb

## 2017-07-12 DIAGNOSIS — I10 Essential (primary) hypertension: Secondary | ICD-10-CM | POA: Diagnosis not present

## 2017-07-12 LAB — BASIC METABOLIC PANEL
BUN: 22 mg/dL (ref 6–23)
CALCIUM: 10.4 mg/dL (ref 8.4–10.5)
CO2: 30 meq/L (ref 19–32)
CREATININE: 0.65 mg/dL (ref 0.40–1.20)
Chloride: 98 mEq/L (ref 96–112)
GFR: 94.18 mL/min (ref >60.00)
GLUCOSE: 121 mg/dL — AB (ref 70–99)
Potassium: 4.6 mEq/L (ref 3.5–5.1)
Sodium: 137 mEq/L (ref 135–145)

## 2017-07-12 MED ORDER — CARVEDILOL 3.125 MG PO TABS: ORAL_TABLET | ORAL | 1 refills | Status: DC

## 2017-07-12 MED ORDER — TRIAMTERENE-HCTZ 37.5-25 MG PO TABS: 1.0000 | ORAL_TABLET | Freq: Every day | ORAL | 1 refills | Status: DC
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
## 2018-01-17 ENCOUNTER — Ambulatory Visit (INDEPENDENT_AMBULATORY_CARE_PROVIDER_SITE_OTHER): Payer: Medicare Other

## 2018-01-17 ENCOUNTER — Ambulatory Visit (INDEPENDENT_AMBULATORY_CARE_PROVIDER_SITE_OTHER): Payer: Medicare Other | Admitting: Family Medicine

## 2018-01-17 ENCOUNTER — Other Ambulatory Visit: Payer: Self-pay

## 2018-01-17 ENCOUNTER — Encounter: Payer: Self-pay | Admitting: Family Medicine

## 2018-01-17 ENCOUNTER — Ambulatory Visit: Payer: Medicare Other | Admitting: Family Medicine

## 2018-01-17 VITALS — BP 146/98 | HR 83 | Temp 98.5°F | Resp 18 | Ht 62.5 in | Wt 176.2 lb

## 2018-01-17 DIAGNOSIS — I10 Essential (primary) hypertension: Secondary | ICD-10-CM | POA: Diagnosis not present

## 2018-01-17 DIAGNOSIS — Z0001 Encounter for general adult medical examination with abnormal findings: Secondary | ICD-10-CM | POA: Diagnosis not present

## 2018-01-17 DIAGNOSIS — Z0000 Encounter for general adult medical examination without abnormal findings: Secondary | ICD-10-CM | POA: Diagnosis not present

## 2018-01-17 DIAGNOSIS — M25552 Pain in left hip: Secondary | ICD-10-CM

## 2018-01-17 DIAGNOSIS — M7062 Trochanteric bursitis, left hip: Secondary | ICD-10-CM

## 2018-01-17 DIAGNOSIS — E669 Obesity, unspecified: Secondary | ICD-10-CM | POA: Diagnosis not present

## 2018-01-17 MED ORDER — CARVEDILOL 12.5 MG PO TABS: 12.5000 mg | ORAL_TABLET | Freq: Two times a day (BID) | ORAL | 3 refills | Status: DC

## 2018-01-17 NOTE — Patient Instructions (Addendum)
Continue doing brain stimulating activities (puzzles, reading, adult coloring books, staying active) to keep memory sharp.   Bring a copy of your living will and/or healthcare power of attorney to your next office visit.   Health Maintenance, Female Adopting a healthy lifestyle and getting preventive care can go a long way to promote health and wellness. Talk with your health care provider about what schedule of regular examinations is right for you. This is a good chance for you to check in with your provider about disease prevention and staying healthy. In between checkups, there are plenty of things you can do on your own. Experts have done a lot of research about which lifestyle changes and preventive measures are most likely to keep you healthy. Ask your health care provider for more information. Weight and diet Eat a healthy diet  Be sure to include plenty of vegetables, fruits, low-fat dairy products, and lean protein.  Do not eat a lot of foods high in solid fats, added sugars, or salt.  Get regular exercise. This is one of the most important things you can do for your health. ? Most adults should exercise for at least 150 minutes each week. The exercise should increase your heart rate and make you sweat (moderate-intensity exercise). ? Most adults should also do strengthening exercises at least twice a week. This is in addition to the moderate-intensity exercise.  Maintain a healthy weight  Body mass index (BMI) is a measurement that can be used to identify possible weight problems. It estimates body fat based on height and weight. Your health care provider can help determine your BMI and help you achieve or maintain a healthy weight.  For females 85 years of age and older: ? A BMI below 18.5 is considered underweight. ? A BMI of 18.5 to 24.9 is normal. ? A BMI of 25 to 29.9 is considered overweight. ? A BMI of 30 and above is considered obese.  Watch levels of cholesterol and  blood lipids  You should start having your blood tested for lipids and cholesterol at 77 years of age, then have this test every 5 years.  You may need to have your cholesterol levels checked more often if: ? Your lipid or cholesterol levels are high. ? You are older than 77 years of age. ? You are at high risk for heart disease.  Cancer screening Lung Cancer  Lung cancer screening is recommended for adults 41-40 years old who are at high risk for lung cancer because of a history of smoking.  A yearly low-dose CT scan of the lungs is recommended for people who: ? Currently smoke. ? Have quit within the past 15 years. ? Have at least a 30-pack-year history of smoking. A pack year is smoking an average of one pack of cigarettes a day for 1 year.  Yearly screening should continue until it has been 15 years since you quit.  Yearly screening should stop if you develop a health problem that would prevent you from having lung cancer treatment.  Breast Cancer  Practice breast self-awareness. This means understanding how your breasts normally appear and feel.  It also means doing regular breast self-exams. Let your health care provider know about any changes, no matter how small.  If you are in your 20s or 30s, you should have a clinical breast exam (CBE) by a health care provider every 1-3 years as part of a regular health exam.  If you are 87 or older, have a CBE  every year. Also consider having a breast X-ray (mammogram) every year.  If you have a family history of breast cancer, talk to your health care provider about genetic screening.  If you are at high risk for breast cancer, talk to your health care provider about having an MRI and a mammogram every year.  Breast cancer gene (BRCA) assessment is recommended for women who have family members with BRCA-related cancers. BRCA-related cancers include: ? Breast. ? Ovarian. ? Tubal. ? Peritoneal cancers.  Results of the assessment  will determine the need for genetic counseling and BRCA1 and BRCA2 testing.  Cervical Cancer Your health care provider may recommend that you be screened regularly for cancer of the pelvic organs (ovaries, uterus, and vagina). This screening involves a pelvic examination, including checking for microscopic changes to the surface of your cervix (Pap test). You may be encouraged to have this screening done every 3 years, beginning at age 68.  For women ages 66-65, health care providers may recommend pelvic exams and Pap testing every 3 years, or they may recommend the Pap and pelvic exam, combined with testing for human papilloma virus (HPV), every 5 years. Some types of HPV increase your risk of cervical cancer. Testing for HPV may also be done on women of any age with unclear Pap test results.  Other health care providers may not recommend any screening for nonpregnant women who are considered low risk for pelvic cancer and who do not have symptoms. Ask your health care provider if a screening pelvic exam is right for you.  If you have had past treatment for cervical cancer or a condition that could lead to cancer, you need Pap tests and screening for cancer for at least 20 years after your treatment. If Pap tests have been discontinued, your risk factors (such as having a new sexual partner) need to be reassessed to determine if screening should resume. Some women have medical problems that increase the chance of getting cervical cancer. In these cases, your health care provider may recommend more frequent screening and Pap tests.  Colorectal Cancer  This type of cancer can be detected and often prevented.  Routine colorectal cancer screening usually begins at 77 years of age and continues through 77 years of age.  Your health care provider may recommend screening at an earlier age if you have risk factors for colon cancer.  Your health care provider may also recommend using home test kits to  check for hidden blood in the stool.  A small camera at the end of a tube can be used to examine your colon directly (sigmoidoscopy or colonoscopy). This is done to check for the earliest forms of colorectal cancer.  Routine screening usually begins at age 64.  Direct examination of the colon should be repeated every 5-10 years through 77 years of age. However, you may need to be screened more often if early forms of precancerous polyps or small growths are found.  Skin Cancer  Check your skin from head to toe regularly.  Tell your health care provider about any new moles or changes in moles, especially if there is a change in a mole's shape or color.  Also tell your health care provider if you have a mole that is larger than the size of a pencil eraser.  Always use sunscreen. Apply sunscreen liberally and repeatedly throughout the day.  Protect yourself by wearing long sleeves, pants, a wide-brimmed hat, and sunglasses whenever you are outside.  Heart disease, diabetes,  and high blood pressure  High blood pressure causes heart disease and increases the risk of stroke. High blood pressure is more likely to develop in: ? People who have blood pressure in the high end of the normal range (130-139/85-89 mm Hg). ? People who are overweight or obese. ? People who are African American.  If you are 24-58 years of age, have your blood pressure checked every 3-5 years. If you are 7 years of age or older, have your blood pressure checked every year. You should have your blood pressure measured twice-once when you are at a hospital or clinic, and once when you are not at a hospital or clinic. Record the average of the two measurements. To check your blood pressure when you are not at a hospital or clinic, you can use: ? An automated blood pressure machine at a pharmacy. ? A home blood pressure monitor.  If you are between 5 years and 25 years old, ask your health care provider if you should  take aspirin to prevent strokes.  Have regular diabetes screenings. This involves taking a blood sample to check your fasting blood sugar level. ? If you are at a normal weight and have a low risk for diabetes, have this test once every three years after 77 years of age. ? If you are overweight and have a high risk for diabetes, consider being tested at a younger age or more often. Preventing infection Hepatitis B  If you have a higher risk for hepatitis B, you should be screened for this virus. You are considered at high risk for hepatitis B if: ? You were born in a country where hepatitis B is common. Ask your health care provider which countries are considered high risk. ? Your parents were born in a high-risk country, and you have not been immunized against hepatitis B (hepatitis B vaccine). ? You have HIV or AIDS. ? You use needles to inject street drugs. ? You live with someone who has hepatitis B. ? You have had sex with someone who has hepatitis B. ? You get hemodialysis treatment. ? You take certain medicines for conditions, including cancer, organ transplantation, and autoimmune conditions.  Hepatitis C  Blood testing is recommended for: ? Everyone born from [birth year] through 1965. ? Anyone with known risk factors for hepatitis C.  Sexually transmitted infections (STIs)  You should be screened for sexually transmitted infections (STIs) including gonorrhea and chlamydia if: ? You are sexually active and are younger than 77 years of age. ? You are older than 77 years of age and your health care provider tells you that you are at risk for this type of infection. ? Your sexual activity has changed since you were last screened and you are at an increased risk for chlamydia or gonorrhea. Ask your health care provider if you are at risk.  If you do not have HIV, but are at risk, it may be recommended that you take a prescription medicine daily to prevent HIV infection. This is called  pre-exposure prophylaxis (PrEP). You are considered at risk if: ? You are sexually active and do not regularly use condoms or know the HIV status of your partner(s). ? You take drugs by injection. ? You are sexually active with a partner who has HIV.  Talk with your health care provider about whether you are at high risk of being infected with HIV. If you choose to begin PrEP, you should first be tested for HIV. You should then  be tested every 3 months for as long as you are taking PrEP. Pregnancy  If you are premenopausal and you may become pregnant, ask your health care provider about preconception counseling.  If you may become pregnant, take 400 to 800 micrograms (mcg) of folic acid every day.  If you want to prevent pregnancy, talk to your health care provider about birth control (contraception). Osteoporosis and menopause  Osteoporosis is a disease in which the bones lose minerals and strength with aging. This can result in serious bone fractures. Your risk for osteoporosis can be identified using a bone density scan.  If you are 38 years of age or older, or if you are at risk for osteoporosis and fractures, ask your health care provider if you should be screened.  Ask your health care provider whether you should take a calcium or vitamin D supplement to lower your risk for osteoporosis.  Menopause may have certain physical symptoms and risks.  Hormone replacement therapy may reduce some of these symptoms and risks. Talk to your health care provider about whether hormone replacement therapy is right for you. Follow these instructions at home:  Schedule regular health, dental, and eye exams.  Stay current with your immunizations.  Do not use any tobacco products including cigarettes, chewing tobacco, or electronic cigarettes.  If you are pregnant, do not drink alcohol.  If you are breastfeeding, limit how much and how often you drink alcohol.  Limit alcohol intake to no more  than 1 drink per day for nonpregnant women. One drink equals 12 ounces of beer, 5 ounces of wine, or 1 ounces of hard liquor.  Do not use street drugs.  Do not share needles.  Ask your health care provider for help if you need support or information about quitting drugs.  Tell your health care provider if you often feel depressed.  Tell your health care provider if you have ever been abused or do not feel safe at home. This information is not intended to replace advice given to you by your health care provider. Make sure you discuss any questions you have with your health care provider. Document Released: 01/05/2011 Document Revised: 11/28/2015 Document Reviewed: 03/26/2015 Elsevier Interactive Patient Education  [employer].

## 2018-01-17 NOTE — Progress Notes (Signed)
AWV reviewed and agree. Signed:  Crissie Sickles, MD           01/17/2018

## 2018-01-17 NOTE — Patient Instructions (Signed)
Trochanteric Bursitis Trochanteric bursitis is a condition that causes hip pain. Trochanteric bursitis happens when fluid-filled sacs (bursae) in the hip get irritated. Normally these sacs absorb shock and help strong bands of tissue (tendons) in your hip glide smoothly over each other and over your hip bones. What are the causes? This condition results from increased friction between the hip bones and the tendons that go over them. This condition can happen if you:  Have weak hips.  Use your hip muscles too much (overuse).  Get hit in the hip.  What increases the risk? This condition is more likely to develop in:  Women.  Adults who are middle-aged or older.  People with arthritis or a spinal condition.  People with weak buttocks muscles (gluteal muscles).  People who have one leg that is shorter than the other.  People who participate in certain kinds of athletic activities, such as: ? Running sports, especially long-distance running. ? Contact sports, like football or martial arts. ? Sports in which falls may occur, like skiing.  What are the signs or symptoms? The main symptom of this condition is pain and tenderness over the point of your hip. The pain may be:  Sharp and intense.  Dull and achy.  Felt on the outside of your thigh.  It may increase when you:  Lie on your side.  Walk or run.  Go up on stairs.  Sit.  Stand up after sitting.  Stand for long periods of time.  How is this diagnosed? This condition may be diagnosed based on:  Your symptoms.  Your medical history.  A physical exam.  Imaging tests, such as: ? X-rays to check your bones. ? An MRI or ultrasound to check your tendons and muscles.  During your physical exam, your health care provider will check the movement and strength of your hip. He or she may press on the point of your hip to check for pain. How is this treated? This condition may be treated by:  Resting.  Reducing  your activity.  Avoiding activities that cause pain.  Using crutches, a cane, or a walker to decrease the strain on your hip.  Taking medicine to help with swelling.  Having medicine injected into the bursae to help with swelling.  Using ice, heat, and massage therapy for pain relief.  Physical therapy exercises for strength and flexibility.  Surgery (rare).  Follow these instructions at home: Activity  Rest.  Avoid activities that cause pain.  Return to your normal activities as told by your health care provider. Ask your health care provider what activities are safe for you. Managing pain, stiffness, and swelling  Take over-the-counter and prescription medicines only as told by your health care provider.  If directed, apply heat to the injured area as told by your health care provider. ? Place a towel between your skin and the heat source. ? Leave the heat on for 20-30 minutes. ? Remove the heat if your skin turns bright red. This is especially important if you are unable to feel pain, heat, or cold. You may have a greater risk of getting burned.  If directed, apply ice to the injured area: ? Put ice in a plastic bag. ? Place a towel between your skin and the bag. ? Leave the ice on for 20 minutes, 2-3 times a day. General instructions  If the affected leg is one that you use for driving, ask your health care provider when it is safe to drive.    Use crutches, a cane, or a walker as told by your health care provider.  If one of your legs is shorter than the other, get fitted for a shoe insert.  Lose weight if you are overweight. How is this prevented?  Wear supportive footwear that is appropriate for your sport.  If you have hip pain, start any new exercise or sport slowly.  Maintain physical fitness, including: ? Strength. ? Flexibility. Contact a health care provider if:  Your pain does not improve with 2-4 weeks. Get help right away if:  You develop  severe pain.  You have a fever.  You develop increased redness over your hip.  You have a change in your bowel function or bladder function.  You cannot control the muscles in your feet. This information is not intended to replace advice given to you by your health care provider. Make sure you discuss any questions you have with your health care provider. Document Released: 07/30/2004 Document Revised: 02/26/2016 Document Reviewed: 06/07/2015 Elsevier Interactive Patient Education  2018 [employer] Inc.  
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
## 2018-01-19 ENCOUNTER — Other Ambulatory Visit: Payer: Self-pay | Admitting: Family Medicine

## 2018-02-17 ENCOUNTER — Telehealth: Payer: Self-pay | Admitting: Family Medicine

## 2018-02-17 ENCOUNTER — Ambulatory Visit: Payer: Medicare Other | Admitting: Family Medicine

## 2018-02-17 ENCOUNTER — Other Ambulatory Visit: Payer: Self-pay | Admitting: Family Medicine

## 2018-02-17 ENCOUNTER — Encounter: Payer: Self-pay | Admitting: Family Medicine

## 2018-02-17 VITALS — BP 110/72 | HR 85 | Temp 98.8°F | Resp 16 | Ht 62.5 in | Wt 175.4 lb

## 2018-02-17 DIAGNOSIS — M5410 Radiculopathy, site unspecified: Secondary | ICD-10-CM | POA: Diagnosis not present

## 2018-02-17 DIAGNOSIS — I10 Essential (primary) hypertension: Secondary | ICD-10-CM

## 2018-02-17 DIAGNOSIS — M25552 Pain in left hip: Secondary | ICD-10-CM | POA: Diagnosis not present

## 2018-02-17 DIAGNOSIS — Z96641 Presence of right artificial hip joint: Secondary | ICD-10-CM | POA: Diagnosis not present

## 2018-02-17 MED ORDER — TRAMADOL HCL 50 MG PO TABS: ORAL_TABLET | ORAL | 0 refills | Status: DC

## 2018-02-17 MED ORDER — MELOXICAM 15 MG PO TABS: 15.0000 mg | ORAL_TABLET | Freq: Every day | ORAL | 0 refills | Status: DC

## 2018-02-17 NOTE — Telephone Encounter (Signed)
I forgot to order her x-rays: pls call her and tell her I will order these, but make sure med center [facility] location is ok.  If not, let me know which location she prefers.--thx

## 2018-02-17 NOTE — Patient Instructions (Signed)
Stop taking aspirin please.

## 2018-02-17 NOTE — Addendum Note (Signed)
Addended by: Tammi Sou on: 02/17/2018 04:11 PM   Modules accepted: Orders
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
## 2018-02-17 NOTE — Telephone Encounter (Signed)
Message left on voice mail with notification of xray orders and to call back if med center in [place] was not preferred.

## 2018-03-19 ENCOUNTER — Other Ambulatory Visit: Payer: Self-pay | Admitting: Family Medicine

## 2018-03-21 NOTE — Telephone Encounter (Signed)
RF request for meloxicam LOV: 02/17/18 Next ov: None Last written: 02/17/18 #30 w/ 0RF  Please advise. Thanks.

## 2018-04-20 ENCOUNTER — Other Ambulatory Visit: Payer: Self-pay | Admitting: Family Medicine

## 2018-07-19 ENCOUNTER — Other Ambulatory Visit: Payer: Self-pay | Admitting: Family Medicine

## 2018-07-19 NOTE — Telephone Encounter (Signed)
RX sent for 90 days, pt needs office visit.

## 2018-07-20 NOTE — Telephone Encounter (Signed)
Apt made for 08/01/18 at 1:00pm.

## 2018-08-01 ENCOUNTER — Ambulatory Visit: Payer: Medicare Other | Admitting: Family Medicine

## 2018-08-01 ENCOUNTER — Encounter: Payer: Self-pay | Admitting: Family Medicine
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
## 2018-10-14 ENCOUNTER — Other Ambulatory Visit: Payer: Self-pay | Admitting: Family Medicine

## 2019-01-09 ENCOUNTER — Ambulatory Visit: Payer: Medicare Other | Admitting: Family Medicine

## 2019-01-09 DIAGNOSIS — Z0289 Encounter for other administrative examinations: Secondary | ICD-10-CM
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
## 2019-01-12 ENCOUNTER — Encounter (HOSPITAL_COMMUNITY): Payer: Self-pay | Admitting: Emergency Medicine

## 2019-01-12 ENCOUNTER — Emergency Department (HOSPITAL_COMMUNITY): Payer: Medicare Other

## 2019-01-12 ENCOUNTER — Emergency Department (HOSPITAL_COMMUNITY)
Admission: EM | Admit: 2019-01-12 | Discharge: 2019-01-12 | Disposition: A | Discharge status: Home / Self Care | Payer: Medicare Other | Attending: Emergency Medicine | Admitting: Emergency Medicine

## 2019-01-12 ENCOUNTER — Encounter (HOSPITAL_COMMUNITY): Payer: Self-pay

## 2019-01-12 ENCOUNTER — Other Ambulatory Visit: Payer: Self-pay

## 2019-01-12 ENCOUNTER — Ambulatory Visit (INDEPENDENT_AMBULATORY_CARE_PROVIDER_SITE_OTHER)
Admission: EM | Admit: 2019-01-12 | Discharge: 2019-01-12 | Disposition: A | Discharge status: Home / Self Care | Payer: Medicare Other | Source: Home / Self Care

## 2019-01-12 ENCOUNTER — Ambulatory Visit (INDEPENDENT_AMBULATORY_CARE_PROVIDER_SITE_OTHER): Payer: Medicare Other

## 2019-01-12 DIAGNOSIS — I160 Hypertensive urgency: Secondary | ICD-10-CM

## 2019-01-12 DIAGNOSIS — Z79899 Other long term (current) drug therapy: Secondary | ICD-10-CM | POA: Insufficient documentation

## 2019-01-12 DIAGNOSIS — R0789 Other chest pain: Secondary | ICD-10-CM | POA: Diagnosis present

## 2019-01-12 DIAGNOSIS — I712 Thoracic aortic aneurysm, without rupture, unspecified: Secondary | ICD-10-CM

## 2019-01-12 DIAGNOSIS — I10 Essential (primary) hypertension: Secondary | ICD-10-CM

## 2019-01-12 DIAGNOSIS — R51 Headache: Secondary | ICD-10-CM | POA: Insufficient documentation

## 2019-01-12 DIAGNOSIS — Z96641 Presence of right artificial hip joint: Secondary | ICD-10-CM | POA: Diagnosis not present

## 2019-01-12 LAB — BASIC METABOLIC PANEL
Anion gap: 11 (ref 5–15)
BUN: 18 mg/dL (ref 8–23)
CO2: 23 mmol/L (ref 22–32)
Calcium: 9.9 mg/dL (ref 8.9–10.3)
Chloride: 106 mmol/L (ref 98–111)
Creatinine, Ser: 0.62 mg/dL (ref 0.44–1.00)
GFR calc Af Amer: >60 mL/min (ref >60)
GFR calc non Af Amer: >60 mL/min (ref >60)
Glucose, Bld: 106 mg/dL — ABNORMAL HIGH (ref 70–99)
Potassium: 3.8 mmol/L (ref 3.5–5.1)
Sodium: 140 mmol/L (ref 135–145)

## 2019-01-12 LAB — CBC
HCT: 39 % (ref 36.0–46.0)
Hemoglobin: 12.7 g/dL (ref 12.0–15.0)
MCH: 32.6 pg (ref 26.0–34.0)
MCHC: 32.6 g/dL (ref 30.0–36.0)
MCV: 100.3 fL — ABNORMAL HIGH (ref 80.0–100.0)
Platelets: 290 10*3/uL (ref 150–400)
RBC: 3.89 MIL/uL (ref 3.87–5.11)
RDW: 13.5 % (ref 11.5–15.5)
WBC: 6.6 10*3/uL (ref 4.0–10.5)
nRBC: 0.3 % — ABNORMAL HIGH (ref 0.0–0.2)

## 2019-01-12 LAB — BRAIN NATRIURETIC PEPTIDE: B Natriuretic Peptide: 32 pg/mL (ref 0.0–100.0)

## 2019-01-12 LAB — TROPONIN I (HIGH SENSITIVITY)
Troponin I (High Sensitivity): 4 ng/L (ref <18)
Troponin I (High Sensitivity): 5 ng/L (ref <18)

## 2019-01-12 MED ORDER — LABETALOL HCL 5 MG/ML IV SOLN
5.0000 mg | Freq: Once | INTRAVENOUS | Status: AC
  Administered 2019-01-12: 5 mg via INTRAVENOUS
  Filled 2019-01-12: qty 4

## 2019-01-12 MED ORDER — IOHEXOL 350 MG/ML SOLN
100.0000 mL | Freq: Once | INTRAVENOUS | Status: AC | PRN
  Administered 2019-01-12: 100 mL via INTRAVENOUS

## 2019-01-12 MED ORDER — TRIAMTERENE-HCTZ 37.5-25 MG PO TABS: 1.0000 | ORAL_TABLET | Freq: Every day | ORAL | 0 refills | Status: DC

## 2019-01-12 MED ORDER — HYDROCHLOROTHIAZIDE 25 MG PO TABS
25.0000 mg | ORAL_TABLET | Freq: Every day | ORAL | Status: DC
  Filled 2019-01-12: qty 1

## 2019-01-12 MED ORDER — SODIUM CHLORIDE 0.9% FLUSH
3.0000 mL | Freq: Once | INTRAVENOUS | Status: AC
  Administered 2019-01-12: 3 mL via INTRAVENOUS

## 2019-01-12 NOTE — ED Notes (Signed)
Patient verbalizes understanding of discharge instructions. Opportunity for questioning and answers were provided. Armband removed by staff, pt discharged from ED ambulatory.   

## 2019-01-12 NOTE — ED Notes (Signed)
Patient transported to CT 
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
## 2019-01-12 NOTE — ED Triage Notes (Signed)
Pt has a known aortic aneurism and has a dull mid chest pain radiating to the left chest. Pt states she has been retaining fluid in her lungs and legs. Denies shortness of breath.

## 2019-01-12 NOTE — Discharge Instructions (Signed)
Additional physician discharge instructions: -You have been provided with a new blood pressure prescription for HCTZ 25 mg once daily.  Please take this along with your carvedilol.  Please contact the information once above or find a new PCP online.  Please return to the emergency department as needed with the development of any new, worsening or concerning symptoms.

## 2019-01-12 NOTE — Progress Notes (Signed)
Pt had 66ml omni 350 extravasation right antecubital dr sanford assessed patient, iv was removed,arm was elevated The patient is advised to apply ice or cold packs intermittently as needed to relieve pain. Was placed at site.verbal instructions were given to the patient, a verbal handoff was given to rn benjamin bailiff,extravasation discharge orders were placed.

## 2019-01-12 NOTE — ED Triage Notes (Signed)
Pt presents with ongoing chest congestion and nasal drainage that is unrelieved with OTC medication.
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
## 2019-01-12 NOTE — Discharge Instructions (Signed)
78 year old female with remote history of aortic arch aneurysm, hypertension presenting for multiple concerns.  Patient has had chest congestion with intermittent cough x4 months.  EKG in office was reviewed by me: Stable from previous in December 2018.  This x-ray negative for pulmonary edema, though notes tortuous aorta a possible aneurysm.  Due to lack of PCP, concern for lack of continuity of care and duration since diagnosis in setting of extreme hypertension patient was advised to go to ER for further imaging as recommended by radiologist.

## 2019-01-14 ENCOUNTER — Other Ambulatory Visit: Payer: Self-pay | Admitting: Family Medicine

## 2019-01-18 ENCOUNTER — Telehealth: Payer: Self-pay | Admitting: *Deleted

## 2019-01-19 ENCOUNTER — Other Ambulatory Visit: Payer: Self-pay | Admitting: Family Medicine

## 2019-01-20 ENCOUNTER — Encounter: Payer: Medicare Other | Admitting: Cardiothoracic Surgery

## 2019-09-04 DIAGNOSIS — Z23 Encounter for immunization: Secondary | ICD-10-CM | POA: Diagnosis not present

## 2019-09-04 DIAGNOSIS — I10 Essential (primary) hypertension: Secondary | ICD-10-CM | POA: Diagnosis not present

## 2019-09-04 DIAGNOSIS — Z0000 Encounter for general adult medical examination without abnormal findings: Secondary | ICD-10-CM | POA: Diagnosis not present

## 2019-09-04 DIAGNOSIS — Z1211 Encounter for screening for malignant neoplasm of colon: Secondary | ICD-10-CM | POA: Diagnosis not present

## 2019-10-09 DIAGNOSIS — R413 Other amnesia: Secondary | ICD-10-CM | POA: Diagnosis not present

## 2019-10-09 DIAGNOSIS — Z1211 Encounter for screening for malignant neoplasm of colon: Secondary | ICD-10-CM | POA: Diagnosis not present

## 2019-10-09 DIAGNOSIS — M25559 Pain in unspecified hip: Secondary | ICD-10-CM | POA: Diagnosis not present

## 2019-10-13 DIAGNOSIS — Z1211 Encounter for screening for malignant neoplasm of colon: Secondary | ICD-10-CM | POA: Diagnosis not present

## 2020-03-06 DIAGNOSIS — I10 Essential (primary) hypertension: Secondary | ICD-10-CM | POA: Diagnosis not present

## 2020-04-16 DIAGNOSIS — R0602 Shortness of breath: Secondary | ICD-10-CM | POA: Diagnosis not present

## 2020-04-16 DIAGNOSIS — I447 Left bundle-branch block, unspecified: Secondary | ICD-10-CM | POA: Diagnosis not present
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
## 2020-05-03 ENCOUNTER — Ambulatory Visit: Payer: Medicare PPO | Admitting: Cardiology

## 2020-05-03 ENCOUNTER — Other Ambulatory Visit: Payer: Self-pay

## 2020-05-03 ENCOUNTER — Encounter: Payer: Self-pay | Admitting: Cardiology

## 2020-05-03 VITALS — BP 116/66 | HR 73 | Ht 60.0 in | Wt 185.0 lb

## 2020-05-03 DIAGNOSIS — E785 Hyperlipidemia, unspecified: Secondary | ICD-10-CM | POA: Diagnosis not present

## 2020-05-03 DIAGNOSIS — I712 Thoracic aortic aneurysm, without rupture, unspecified: Secondary | ICD-10-CM

## 2020-05-03 DIAGNOSIS — I10 Essential (primary) hypertension: Secondary | ICD-10-CM | POA: Diagnosis not present

## 2020-05-03 DIAGNOSIS — I447 Left bundle-branch block, unspecified: Secondary | ICD-10-CM

## 2020-05-03 MED ORDER — ROSUVASTATIN CALCIUM 10 MG PO TABS: 10.0000 mg | ORAL_TABLET | Freq: Every day | ORAL | 3 refills | Status: DC

## 2020-05-03 NOTE — Patient Instructions (Addendum)
Medication Instructions:  Start Rosuvastatin (crestor) 10mg  once a day. *If you need a refill on your cardiac medications before your next appointment, please call your pharmacy*   Testing/Procedures: Your physician has requested that you have an echocardiogram. Echocardiography is a painless test that uses sound waves to create images of your heart. It provides your doctor with information about the size and shape of your heart and how well your heart's chambers and valves are working. This procedure takes approximately one hour. There are no restrictions for this procedure. [place]. [place], [address].  Non-Cardiac CT Angiography chest (CTA), is a special type of CT scan that uses a computer to produce multi-dimensional views of major blood vessels throughout the body. In CT angiography, a contrast material is injected through an IV to help visualize the blood vessels. [place]. [place], [address].    Follow-Up: At [facility], you and your health needs are our priority.  As part of our continuing mission to provide you with exceptional heart care, we have created designated Provider Care Teams.  These Care Teams include your primary Cardiologist (physician) and Advanced Practice Providers (APPs -  Physician Assistants and Nurse Practitioners) who all work together to provide you with the care you need, when you need it.  We recommend signing up for the patient portal called "MyChart".  Sign up information is provided on this After Visit Summary.  MyChart is used to connect with patients for Virtual Visits (Telemedicine).  Patients are able to view lab/test results, encounter notes, upcoming appointments, etc.  Non-urgent messages can be sent to your provider as well.   To learn more about what you can do with MyChart, go to [email or web address].    Your next appointment:   3 month(s)  The format for your next appointment:   In Person  Provider:   Oswaldo Milian, MD

## 2020-05-21 ENCOUNTER — Telehealth: Payer: Self-pay

## 2020-05-21 DIAGNOSIS — I712 Thoracic aortic aneurysm, without rupture, unspecified: Secondary | ICD-10-CM

## 2020-05-21 DIAGNOSIS — I447 Left bundle-branch block, unspecified: Secondary | ICD-10-CM

## 2020-05-21 DIAGNOSIS — E785 Hyperlipidemia, unspecified: Secondary | ICD-10-CM

## 2020-05-21 NOTE — Telephone Encounter (Signed)
-----   Message from Fonda Kinder sent at 05/21/2020  1:37 PM EST ----- Regarding: Lab order needed Good afternoon  This patient is coming for an Echo and CT on 12/7th. I need a new BUN CRE for her Can an order be placed for BMET and I will call to get her in for it  Thanks Lattie Haw

## 2020-05-21 NOTE — Telephone Encounter (Signed)
BMET has been ordered.

## 2020-05-27 ENCOUNTER — Other Ambulatory Visit (HOSPITAL_COMMUNITY): Payer: Medicare PPO

## 2020-05-27 ENCOUNTER — Inpatient Hospital Stay: Admission: RE | Admit: 2020-05-27 | Payer: Medicare PPO | Source: Ambulatory Visit

## 2020-06-07 ENCOUNTER — Other Ambulatory Visit: Payer: Self-pay

## 2020-06-07 DIAGNOSIS — I447 Left bundle-branch block, unspecified: Secondary | ICD-10-CM | POA: Diagnosis not present

## 2020-06-07 DIAGNOSIS — I712 Thoracic aortic aneurysm, without rupture, unspecified: Secondary | ICD-10-CM

## 2020-06-07 DIAGNOSIS — E785 Hyperlipidemia, unspecified: Secondary | ICD-10-CM

## 2020-06-08 LAB — BASIC METABOLIC PANEL
BUN/Creatinine Ratio: 32 — ABNORMAL HIGH (ref 12–28)
BUN: 25 mg/dL (ref 8–27)
CO2: 28 mmol/L (ref 20–29)
Calcium: 9.9 mg/dL (ref 8.7–10.3)
Chloride: 103 mmol/L (ref 96–106)
Creatinine, Ser: 0.78 mg/dL (ref 0.57–1.00)
GFR calc Af Amer: 84 mL/min/{1.73_m2} (ref >59)
GFR calc non Af Amer: 73 mL/min/{1.73_m2} (ref >59)
Glucose: 96 mg/dL (ref 65–99)
Potassium: 4.8 mmol/L (ref 3.5–5.2)
Sodium: 143 mmol/L (ref 134–144)

## 2020-06-11 ENCOUNTER — Other Ambulatory Visit: Payer: Self-pay

## 2020-06-11 ENCOUNTER — Ambulatory Visit (INDEPENDENT_AMBULATORY_CARE_PROVIDER_SITE_OTHER)
Admission: RE | Admit: 2020-06-11 | Discharge: 2020-06-11 | Disposition: A | Discharge status: Home / Self Care | Payer: Medicare PPO | Source: Ambulatory Visit | Attending: Cardiology | Admitting: Cardiology

## 2020-06-11 ENCOUNTER — Ambulatory Visit (HOSPITAL_COMMUNITY): Payer: Medicare PPO | Attending: Internal Medicine

## 2020-06-11 DIAGNOSIS — I712 Thoracic aortic aneurysm, without rupture, unspecified: Secondary | ICD-10-CM

## 2020-06-11 DIAGNOSIS — I10 Essential (primary) hypertension: Secondary | ICD-10-CM | POA: Diagnosis not present

## 2020-06-11 LAB — ECHOCARDIOGRAM COMPLETE
Area-P 1/2: 2.59 cm2
S' Lateral: 2.75 cm

## 2020-06-11 MED ORDER — IOHEXOL 350 MG/ML SOLN
100.0000 mL | Freq: Once | INTRAVENOUS | Status: AC | PRN
  Administered 2020-06-11: 100 mL via INTRAVENOUS

## 2020-06-13 ENCOUNTER — Other Ambulatory Visit: Payer: Self-pay | Admitting: *Deleted

## 2020-06-13 DIAGNOSIS — I712 Thoracic aortic aneurysm, without rupture, unspecified: Secondary | ICD-10-CM

## 2020-08-19 ENCOUNTER — Inpatient Hospital Stay (HOSPITAL_COMMUNITY)
Admission: EM | Admit: 2020-08-19 | Discharge: 2020-08-27 | DRG: 641 | Disposition: A | Discharge status: Home Health | Payer: Medicare PPO | Attending: Internal Medicine | Admitting: Internal Medicine

## 2020-08-19 ENCOUNTER — Emergency Department (HOSPITAL_COMMUNITY): Payer: Medicare PPO

## 2020-08-19 ENCOUNTER — Other Ambulatory Visit: Payer: Self-pay

## 2020-08-19 DIAGNOSIS — Z9109 Other allergy status, other than to drugs and biological substances: Secondary | ICD-10-CM | POA: Diagnosis not present

## 2020-08-19 DIAGNOSIS — R0902 Hypoxemia: Secondary | ICD-10-CM | POA: Diagnosis not present

## 2020-08-19 DIAGNOSIS — I447 Left bundle-branch block, unspecified: Secondary | ICD-10-CM | POA: Diagnosis present

## 2020-08-19 DIAGNOSIS — R413 Other amnesia: Secondary | ICD-10-CM | POA: Diagnosis not present

## 2020-08-19 DIAGNOSIS — Z888 Allergy status to other drugs, medicaments and biological substances status: Secondary | ICD-10-CM

## 2020-08-19 DIAGNOSIS — G9389 Other specified disorders of brain: Secondary | ICD-10-CM | POA: Diagnosis not present

## 2020-08-19 DIAGNOSIS — D72829 Elevated white blood cell count, unspecified: Secondary | ICD-10-CM | POA: Diagnosis not present

## 2020-08-19 DIAGNOSIS — R350 Frequency of micturition: Secondary | ICD-10-CM | POA: Diagnosis present

## 2020-08-19 DIAGNOSIS — I248 Other forms of acute ischemic heart disease: Secondary | ICD-10-CM | POA: Diagnosis present

## 2020-08-19 DIAGNOSIS — T502X5A Adverse effect of carbonic-anhydrase inhibitors, benzothiadiazides and other diuretics, initial encounter: Secondary | ICD-10-CM | POA: Diagnosis present

## 2020-08-19 DIAGNOSIS — Z96641 Presence of right artificial hip joint: Secondary | ICD-10-CM | POA: Diagnosis present

## 2020-08-19 DIAGNOSIS — I10 Essential (primary) hypertension: Secondary | ICD-10-CM | POA: Diagnosis not present

## 2020-08-19 DIAGNOSIS — J3489 Other specified disorders of nose and nasal sinuses: Secondary | ICD-10-CM | POA: Diagnosis not present

## 2020-08-19 DIAGNOSIS — R000 Tachycardia, unspecified: Secondary | ICD-10-CM | POA: Diagnosis not present

## 2020-08-19 DIAGNOSIS — R0602 Shortness of breath: Secondary | ICD-10-CM | POA: Diagnosis not present

## 2020-08-19 DIAGNOSIS — E785 Hyperlipidemia, unspecified: Secondary | ICD-10-CM | POA: Diagnosis present

## 2020-08-19 DIAGNOSIS — N3941 Urge incontinence: Secondary | ICD-10-CM | POA: Diagnosis present

## 2020-08-19 DIAGNOSIS — M1990 Unspecified osteoarthritis, unspecified site: Secondary | ICD-10-CM | POA: Diagnosis present

## 2020-08-19 DIAGNOSIS — R0600 Dyspnea, unspecified: Secondary | ICD-10-CM | POA: Diagnosis not present

## 2020-08-19 DIAGNOSIS — Z79899 Other long term (current) drug therapy: Secondary | ICD-10-CM | POA: Diagnosis not present

## 2020-08-19 DIAGNOSIS — R7989 Other specified abnormal findings of blood chemistry: Secondary | ICD-10-CM | POA: Diagnosis not present

## 2020-08-19 DIAGNOSIS — E876 Hypokalemia: Principal | ICD-10-CM | POA: Diagnosis present

## 2020-08-19 DIAGNOSIS — F419 Anxiety disorder, unspecified: Secondary | ICD-10-CM | POA: Diagnosis present

## 2020-08-19 DIAGNOSIS — R600 Localized edema: Secondary | ICD-10-CM | POA: Diagnosis present

## 2020-08-19 DIAGNOSIS — K219 Gastro-esophageal reflux disease without esophagitis: Secondary | ICD-10-CM | POA: Diagnosis present

## 2020-08-19 DIAGNOSIS — F32A Depression, unspecified: Secondary | ICD-10-CM | POA: Diagnosis present

## 2020-08-19 DIAGNOSIS — J45909 Unspecified asthma, uncomplicated: Secondary | ICD-10-CM | POA: Diagnosis present

## 2020-08-19 DIAGNOSIS — M810 Age-related osteoporosis without current pathological fracture: Secondary | ICD-10-CM | POA: Diagnosis present

## 2020-08-19 DIAGNOSIS — Z8261 Family history of arthritis: Secondary | ICD-10-CM

## 2020-08-19 DIAGNOSIS — R778 Other specified abnormalities of plasma proteins: Secondary | ICD-10-CM | POA: Diagnosis not present

## 2020-08-19 DIAGNOSIS — Z85828 Personal history of other malignant neoplasm of skin: Secondary | ICD-10-CM

## 2020-08-19 DIAGNOSIS — G8929 Other chronic pain: Secondary | ICD-10-CM | POA: Diagnosis present

## 2020-08-19 DIAGNOSIS — Z20822 Contact with and (suspected) exposure to covid-19: Secondary | ICD-10-CM | POA: Diagnosis present

## 2020-08-19 DIAGNOSIS — M545 Low back pain, unspecified: Secondary | ICD-10-CM | POA: Diagnosis present

## 2020-08-19 DIAGNOSIS — I6782 Cerebral ischemia: Secondary | ICD-10-CM | POA: Diagnosis not present

## 2020-08-19 DIAGNOSIS — I712 Thoracic aortic aneurysm, without rupture: Secondary | ICD-10-CM | POA: Diagnosis present

## 2020-08-19 DIAGNOSIS — R531 Weakness: Principal | ICD-10-CM

## 2020-08-19 DIAGNOSIS — Z8249 Family history of ischemic heart disease and other diseases of the circulatory system: Secondary | ICD-10-CM

## 2020-08-19 DIAGNOSIS — I517 Cardiomegaly: Secondary | ICD-10-CM | POA: Diagnosis not present

## 2020-08-19 DIAGNOSIS — I6389 Other cerebral infarction: Secondary | ICD-10-CM | POA: Diagnosis not present

## 2020-08-19 LAB — CBC WITH DIFFERENTIAL/PLATELET
Abs Immature Granulocytes: 0.03 10*3/uL (ref 0.00–0.07)
Basophils Absolute: 0 10*3/uL (ref 0.0–0.1)
Basophils Relative: 0 %
Eosinophils Absolute: 0 10*3/uL (ref 0.0–0.5)
Eosinophils Relative: 0 %
HCT: 39.4 % (ref 36.0–46.0)
Hemoglobin: 12.5 g/dL (ref 12.0–15.0)
Immature Granulocytes: 0 %
Lymphocytes Relative: 12 %
Lymphs Abs: 1.4 10*3/uL (ref 0.7–4.0)
MCH: 32.2 pg (ref 26.0–34.0)
MCHC: 31.7 g/dL (ref 30.0–36.0)
MCV: 101.5 fL — ABNORMAL HIGH (ref 80.0–100.0)
Monocytes Absolute: 1.4 10*3/uL — ABNORMAL HIGH (ref 0.1–1.0)
Monocytes Relative: 12 %
Neutro Abs: 8.6 10*3/uL — ABNORMAL HIGH (ref 1.7–7.7)
Neutrophils Relative %: 76 %
Platelets: 265 10*3/uL (ref 150–400)
RBC: 3.88 MIL/uL (ref 3.87–5.11)
RDW: 14.3 % (ref 11.5–15.5)
WBC: 11.5 10*3/uL — ABNORMAL HIGH (ref 4.0–10.5)
nRBC: 0.2 % (ref 0.0–0.2)

## 2020-08-19 LAB — I-STAT CHEM 8, ED
BUN: 37 mg/dL — ABNORMAL HIGH (ref 8–23)
Calcium, Ion: 1.17 mmol/L (ref 1.15–1.40)
Chloride: 101 mmol/L (ref 98–111)
Creatinine, Ser: 0.7 mg/dL (ref 0.44–1.00)
Glucose, Bld: 114 mg/dL — ABNORMAL HIGH (ref 70–99)
HCT: 34 % — ABNORMAL LOW (ref 36.0–46.0)
Hemoglobin: 11.6 g/dL — ABNORMAL LOW (ref 12.0–15.0)
Potassium: 2.6 mmol/L — CL (ref 3.5–5.1)
Sodium: 142 mmol/L (ref 135–145)
TCO2: 30 mmol/L (ref 22–32)

## 2020-08-19 LAB — COMPREHENSIVE METABOLIC PANEL WITH GFR
ALT: 23 U/L (ref 0–44)
AST: 27 U/L (ref 15–41)
Albumin: 3.5 g/dL (ref 3.5–5.0)
Alkaline Phosphatase: 44 U/L (ref 38–126)
Anion gap: 14 (ref 5–15)
BUN: 36 mg/dL — ABNORMAL HIGH (ref 8–23)
CO2: 26 mmol/L (ref 22–32)
Calcium: 9.6 mg/dL (ref 8.9–10.3)
Chloride: 101 mmol/L (ref 98–111)
Creatinine, Ser: 0.71 mg/dL (ref 0.44–1.00)
GFR, Estimated: >60 mL/min
Glucose, Bld: 117 mg/dL — ABNORMAL HIGH (ref 70–99)
Potassium: 2.6 mmol/L — CL (ref 3.5–5.1)
Sodium: 141 mmol/L (ref 135–145)
Total Bilirubin: 0.9 mg/dL (ref 0.3–1.2)
Total Protein: 6.2 g/dL — ABNORMAL LOW (ref 6.5–8.1)

## 2020-08-19 LAB — BRAIN NATRIURETIC PEPTIDE: B Natriuretic Peptide: 706.8 pg/mL — ABNORMAL HIGH (ref 0.0–100.0)

## 2020-08-19 LAB — TROPONIN I (HIGH SENSITIVITY)
Troponin I (High Sensitivity): 194 ng/L
Troponin I (High Sensitivity): 188 ng/L (ref <18)

## 2020-08-19 LAB — LACTIC ACID, PLASMA
Lactic Acid, Venous: 2.1 mmol/L (ref 0.5–1.9)
Lactic Acid, Venous: 1.4 mmol/L (ref 0.5–1.9)

## 2020-08-19 LAB — LIPASE, BLOOD: Lipase: 27 U/L (ref 11–51)

## 2020-08-19 LAB — MAGNESIUM: Magnesium: 2 mg/dL (ref 1.7–2.4)

## 2020-08-19 LAB — CK: Total CK: 226 U/L (ref 38–234)

## 2020-08-19 MED ORDER — POTASSIUM CHLORIDE CRYS ER 20 MEQ PO TBCR
40.0000 meq | EXTENDED_RELEASE_TABLET | Freq: Once | ORAL | Status: AC
  Administered 2020-08-19: 40 meq via ORAL
  Filled 2020-08-19: qty 2

## 2020-08-19 MED ORDER — ONDANSETRON HCL 4 MG/2ML IJ SOLN: 4.0000 mg | Freq: Four times a day (QID) | INTRAMUSCULAR | Status: DC | PRN

## 2020-08-19 MED ORDER — POTASSIUM CHLORIDE 10 MEQ/100ML IV SOLN
10.0000 meq | INTRAVENOUS | Status: AC
  Administered 2020-08-19 – 2020-08-20 (×4): 10 meq via INTRAVENOUS
  Filled 2020-08-19 (×2): qty 100

## 2020-08-19 MED ORDER — ACETAMINOPHEN 325 MG PO TABS
650.0000 mg | ORAL_TABLET | Freq: Four times a day (QID) | ORAL | Status: DC | PRN
  Administered 2020-08-24 – 2020-08-27 (×4): 650 mg via ORAL
  Filled 2020-08-19 (×4): qty 2

## 2020-08-19 MED ORDER — POTASSIUM CHLORIDE 10 MEQ/100ML IV SOLN
10.0000 meq | INTRAVENOUS | Status: AC
  Administered 2020-08-20 (×2): 10 meq via INTRAVENOUS
  Filled 2020-08-19 (×4): qty 100

## 2020-08-19 MED ORDER — ACETAMINOPHEN 650 MG RE SUPP: 650.0000 mg | Freq: Four times a day (QID) | RECTAL | Status: DC | PRN

## 2020-08-19 MED ORDER — ENOXAPARIN SODIUM 40 MG/0.4ML ~~LOC~~ SOLN
40.0000 mg | Freq: Every day | SUBCUTANEOUS | Status: DC
  Administered 2020-08-20 – 2020-08-27 (×8): 40 mg via SUBCUTANEOUS
  Filled 2020-08-19 (×8): qty 0.4

## 2020-08-19 MED ORDER — ONDANSETRON HCL 4 MG PO TABS: 4.0000 mg | ORAL_TABLET | Freq: Four times a day (QID) | ORAL | Status: DC | PRN
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
## 2020-08-19 NOTE — ED Triage Notes (Signed)
Per [facility] em[facility] pt coming from home, son found pt on floor, pt states she laid on floor yesterday because of incontinence and was unable to get up, pt on floor for 24hours. Denies fall. Currently on fluid pill, 120/78, hr 102, spo2 95%, cbg 139. Pt states shes been incontinent for 3 days.
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
## 2020-08-20 DIAGNOSIS — I447 Left bundle-branch block, unspecified: Secondary | ICD-10-CM | POA: Diagnosis not present

## 2020-08-20 DIAGNOSIS — R778 Other specified abnormalities of plasma proteins: Secondary | ICD-10-CM | POA: Diagnosis not present

## 2020-08-20 DIAGNOSIS — R0600 Dyspnea, unspecified: Secondary | ICD-10-CM | POA: Diagnosis not present

## 2020-08-20 DIAGNOSIS — E876 Hypokalemia: Principal | ICD-10-CM

## 2020-08-20 LAB — CBC
HCT: 34.5 % — ABNORMAL LOW (ref 36.0–46.0)
HCT: 34.1 % — ABNORMAL LOW (ref 36.0–46.0)
Hemoglobin: 11.8 g/dL — ABNORMAL LOW (ref 12.0–15.0)
Hemoglobin: 11 g/dL — ABNORMAL LOW (ref 12.0–15.0)
MCH: 33.9 pg (ref 26.0–34.0)
MCH: 32.2 pg (ref 26.0–34.0)
MCHC: 34.2 g/dL (ref 30.0–36.0)
MCHC: 32.3 g/dL (ref 30.0–36.0)
MCV: 99.1 fL (ref 80.0–100.0)
MCV: 99.7 fL (ref 80.0–100.0)
Platelets: 265 10*3/uL (ref 150–400)
Platelets: 230 10*3/uL (ref 150–400)
RBC: 3.48 MIL/uL — ABNORMAL LOW (ref 3.87–5.11)
RBC: 3.42 MIL/uL — ABNORMAL LOW (ref 3.87–5.11)
RDW: 14.5 % (ref 11.5–15.5)
RDW: 14.4 % (ref 11.5–15.5)
WBC: 10.9 10*3/uL — ABNORMAL HIGH (ref 4.0–10.5)
WBC: 8.8 10*3/uL (ref 4.0–10.5)
nRBC: 0.4 % — ABNORMAL HIGH (ref 0.0–0.2)
nRBC: 0.2 % (ref 0.0–0.2)

## 2020-08-20 LAB — COMPREHENSIVE METABOLIC PANEL
ALT: 18 U/L (ref 0–44)
AST: 24 U/L (ref 15–41)
Albumin: 3.1 g/dL — ABNORMAL LOW (ref 3.5–5.0)
Alkaline Phosphatase: 40 U/L (ref 38–126)
Anion gap: 8 (ref 5–15)
BUN: 37 mg/dL — ABNORMAL HIGH (ref 8–23)
CO2: 27 mmol/L (ref 22–32)
Calcium: 9.1 mg/dL (ref 8.9–10.3)
Chloride: 104 mmol/L (ref 98–111)
Creatinine, Ser: 0.72 mg/dL (ref 0.44–1.00)
GFR, Estimated: >60 mL/min (ref >60)
Glucose, Bld: 109 mg/dL — ABNORMAL HIGH (ref 70–99)
Potassium: 4 mmol/L (ref 3.5–5.1)
Sodium: 139 mmol/L (ref 135–145)
Total Bilirubin: 1.1 mg/dL (ref 0.3–1.2)
Total Protein: 5.6 g/dL — ABNORMAL LOW (ref 6.5–8.1)

## 2020-08-20 LAB — CREATININE, SERUM
Creatinine, Ser: 0.93 mg/dL (ref 0.44–1.00)
GFR, Estimated: >60 mL/min (ref >60)

## 2020-08-20 LAB — URINALYSIS, ROUTINE W REFLEX MICROSCOPIC
Bacteria, UA: NONE SEEN
Bilirubin Urine: NEGATIVE
Glucose, UA: NEGATIVE mg/dL
Hgb urine dipstick: NEGATIVE
Ketones, ur: NEGATIVE mg/dL
Leukocytes,Ua: NEGATIVE
Nitrite: NEGATIVE
Protein, ur: 30 mg/dL — AB
Specific Gravity, Urine: 1.024 (ref 1.005–1.030)
pH: 5 (ref 5.0–8.0)

## 2020-08-20 LAB — RESP PANEL BY RT-PCR (FLU A&B, COVID) ARPGX2
Influenza A by PCR: NEGATIVE
Influenza B by PCR: NEGATIVE
SARS Coronavirus 2 by RT PCR: NEGATIVE

## 2020-08-20 MED ORDER — PANTOPRAZOLE SODIUM 40 MG PO TBEC
40.0000 mg | DELAYED_RELEASE_TABLET | Freq: Every day | ORAL | Status: DC
  Administered 2020-08-20 – 2020-08-27 (×8): 40 mg via ORAL
  Filled 2020-08-20 (×8): qty 1

## 2020-08-20 MED ORDER — LORATADINE 10 MG PO TABS: 10.0000 mg | ORAL_TABLET | Freq: Every day | ORAL | Status: DC | PRN

## 2020-08-20 MED ORDER — ROSUVASTATIN CALCIUM 5 MG PO TABS
10.0000 mg | ORAL_TABLET | Freq: Every day | ORAL | Status: DC
  Administered 2020-08-21 – 2020-08-27 (×7): 10 mg via ORAL
  Filled 2020-08-20 (×7): qty 2

## 2020-08-20 NOTE — ED Notes (Signed)
Pt attempted to collect urine sample, reports unable to at this time.
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
## 2020-08-20 NOTE — ED Notes (Signed)
Pt attempted to give urine sample, pt had dirt and sediment in sample, will attempt to recollect

## 2020-08-20 NOTE — Progress Notes (Signed)
PROGRESS NOTE    [name]  OVF:[id number] DOB: Oct 06, 1940 DOA: 08/19/2020 PCP: Orpah Melter, MD   Brief Narrative: 80 year old with past medical history significant for hypertension, aortic aneurysm, anxiety with depression, GERD who was brought by son from home with significant weakness, dyspnea on exertion and urinary frequency. Patient report having excessive urination at night.  She has been feeling weak and unable to get out of bed easily. Evaluation in the ED patient was found to have a potassium of 2.6.  Patient was found to have elevation of troponin.    Assessment & Plan:   Principal Problem:   Hypokalemia Active Problems:   Essential hypertension   GERD (gastroesophageal reflux disease)   Urinary frequency   Weakness   Dyspnea   Troponin I above reference range   1-Severe Hypokalemia;  Secondary to diuretics.  Replaced.   2-Elevation troponin, Dyspnea on exertion:  -Cardiology consulted.  -Chest x ray; no acute cardiopulmonary diseases.   3-Urinary frequency;  Awaiting UA  4-GERD; Continue with PPI.  Generalized weakness; PT, OT      Estimated body mass index is 31.39 kg/m as calculated from the following:   Height as of this encounter: 5' 3.5" (1.613 m).   Weight as of this encounter: 81.6 kg.   DVT prophylaxis: Lovenox Code Status: Full code Family Communication: no family at bedside.  Disposition Plan:  Status is: Inpatient  Remains inpatient appropriate because:Ongoing diagnostic testing needed not appropriate for outpatient work up   Dispo: The patient is from: Home              Anticipated d/c is to: Home              Anticipated d/c date is: 2 days              Patient currently is not medically stable to d/c.   Difficult to place patient No        Consultants:   Cardiology   Procedures:  none Antimicrobials:    Subjective: She report SOB on exertion . She is breathing ok. Denies chest pain    Objective: Vitals:   08/20/20 0346 08/20/20 0530 08/20/20 0615 08/20/20 0700  BP:  125/90 (!) 113/53 (!) 104/47  Pulse:  76 72 72  Resp:  19 (!) 27 (!) 21  Temp: 98.1 F (36.7 C)     TempSrc: Oral     SpO2:  100% 97% 99%  Weight: 81.6 kg     Height: 5' 3.5" (1.613 m)      No intake or output data in the 24 hours ending 08/20/20 0743 Filed Weights   08/19/20 1934 08/20/20 0346  Weight: 80.7 kg 81.6 kg    Examination:  General exam: Appears calm and comfortable  Respiratory system: Clear to auscultation. Respiratory effort normal. Cardiovascular system: S1 & S2 heard, RRR. No JVD, murmurs, rubs, gallops or clicks. No pedal edema. Gastrointestinal system: Abdomen is nondistended, soft and nontender. No organomegaly or masses felt. Normal bowel sounds heard. Central nervous system: Alert and oriented. No focal neurological deficits. Extremities: Symmetric 5 x 5 power.    Data Reviewed: I have personally reviewed following labs and imaging studies  CBC: Recent Labs  Lab 08/19/20 1943 08/19/20 2022 08/20/20 0053 08/20/20 0352  WBC 11.5*  --  10.9* 8.8  NEUTROABS 8.6*  --   --   --   HGB 12.5 11.6* 11.8* 11.0*  HCT 39.4 34.0* 34.5* 34.1*  MCV 101.5*  --  99.1  99.7  PLT 265  --  265 761   Basic Metabolic Panel: Recent Labs  Lab 08/19/20 1943 08/19/20 2022 08/19/20 2230 08/20/20 0053 08/20/20 0352  NA 141 142  --   --  139  K 2.6* 2.6*  --   --  4.0  CL 101 101  --   --  104  CO2 26  --   --   --  27  GLUCOSE 117* 114*  --   --  109*  BUN 36* 37*  --   --  37*  CREATININE 0.71 0.70  --  0.93 0.72  CALCIUM 9.6  --   --   --  9.1  MG  --   --  2.0  --   --    GFR: Estimated Creatinine Clearance: 58.3 mL/min (by C-G formula based on SCr of 0.72 mg/dL). Liver Function Tests: Recent Labs  Lab 08/19/20 1943 08/20/20 0352  AST 27 24  ALT 23 18  ALKPHOS 44 40  BILITOT 0.9 1.1  PROT 6.2* 5.6*  ALBUMIN 3.5 3.1*   Recent Labs  Lab 08/19/20 1943  LIPASE  27   No results for input(s): AMMONIA in the last 168 hours. Coagulation Profile: No results for input(s): INR, PROTIME in the last 168 hours. Cardiac Enzymes: Recent Labs  Lab 08/19/20 1943  CKTOTAL 226   BNP (last 3 results) No results for input(s): PROBNP in the last 8760 hours. HbA1C: No results for input(s): HGBA1C in the last 72 hours. CBG: No results for input(s): GLUCAP in the last 168 hours. Lipid Profile: No results for input(s): CHOL, HDL, LDLCALC, TRIG, CHOLHDL, LDLDIRECT in the last 72 hours. Thyroid Function Tests: No results for input(s): TSH, T4TOTAL, FREET4, T3FREE, THYROIDAB in the last 72 hours. Anemia Panel: No results for input(s): VITAMINB12, FOLATE, FERRITIN, TIBC, IRON, RETICCTPCT in the last 72 hours. Sepsis Labs: Recent Labs  Lab 08/19/20 1943 08/19/20 2230  LATICACIDVEN 2.1* 1.4    Recent Results (from the past 240 hour(s))  Resp Panel by RT-PCR (Flu A&B, Covid) Nasopharyngeal Swab     Status: None   Collection Time: 08/20/20  1:06 AM   Specimen: Nasopharyngeal Swab; Nasopharyngeal(NP) swabs in vial transport medium  Result Value Ref Range Status   SARS Coronavirus 2 by RT PCR NEGATIVE NEGATIVE Final    Comment: (NOTE) SARS-CoV-2 target nucleic acids are NOT DETECTED.  The SARS-CoV-2 RNA is generally detectable in upper respiratory specimens during the acute phase of infection. The lowest concentration of SARS-CoV-2 viral copies this assay can detect is 138 copies/mL. A negative result does not preclude SARS-Cov-2 infection and should not be used as the sole basis for treatment or other patient management decisions. A negative result may occur with  improper specimen collection/handling, submission of specimen other than nasopharyngeal swab, presence of viral mutation(s) within the areas targeted by this assay, and inadequate number of viral copies(<138 copies/mL). A negative result must be combined with clinical observations, patient  history, and epidemiological information. The expected result is Negative.  Fact Sheet for Patients:  EntrepreneurPulse.com.au  Fact Sheet for Healthcare Providers:  IncredibleEmployment.be  This test is no t yet approved or cleared by the [place] FDA and  has been authorized for detection and/or diagnosis of SARS-CoV-2 by FDA under an Emergency Use Authorization (EUA). This EUA will remain  in effect (meaning this test can be used) for the duration of the COVID-19 declaration under Section 564(b)(1) of the Act, 21 U.S.C.section 360bbb-3(b)(1), unless the  authorization is terminated  or revoked sooner.       Influenza A by PCR NEGATIVE NEGATIVE Final   Influenza B by PCR NEGATIVE NEGATIVE Final    Comment: (NOTE) The Xpert Xpress SARS-CoV-2/FLU/RSV plus assay is intended as an aid in the diagnosis of influenza from Nasopharyngeal swab specimens and should not be used as a sole basis for treatment. Nasal washings and aspirates are unacceptable for Xpert Xpress SARS-CoV-2/FLU/RSV testing.  Fact Sheet for Patients: EntrepreneurPulse.com.au  Fact Sheet for Healthcare Providers: IncredibleEmployment.be  This test is not yet approved or cleared by the [place] FDA and has been authorized for detection and/or diagnosis of SARS-CoV-2 by FDA under an Emergency Use Authorization (EUA). This EUA will remain in effect (meaning this test can be used) for the duration of the COVID-19 declaration under Section 564(b)(1) of the Act, 21 U.S.C. section 360bbb-3(b)(1), unless the authorization is terminated or revoked.  Performed at [facility], [place] [address], [place], [place] [address]          Radiology Studies: DG Chest Portable 1 View  Result Date: 08/19/2020 CLINICAL DATA:  Weakness.  Incontinence. EXAM: PORTABLE CHEST 1 VIEW COMPARISON:  01/12/2019 chest radiograph.  CTA chest of  06/11/2020 FINDINGS: Midline trachea. Mild cardiomegaly. Ascending aortic aneurysm was better evaluated on prior CT, but is grossly similar. No pleural effusion or pneumothorax. Mild right hemidiaphragm elevation. Clear lungs. IMPRESSION: No acute cardiopulmonary disease. Ascending aortic aneurysm, as on CTA of 06/11/2020. Electronically Signed   By: Abigail Miyamoto M.D.   On: 08/19/2020 20:31        Scheduled Meds: . enoxaparin (LOVENOX) injection  40 mg Subcutaneous Daily   Continuous Infusions:   LOS: 1 day    Time spent: 35 minutes.     Elmarie Shiley, MD [facility]   If 7PM-7AM, please contact night-coverage [email or web address]  08/20/2020, 7:43 AM

## 2020-08-20 NOTE — ED Notes (Signed)
Attempted to straight cath pt, pt refusing at this time. Pt educated on need for specimen

## 2020-08-21 ENCOUNTER — Inpatient Hospital Stay (HOSPITAL_COMMUNITY): Payer: Medicare PPO

## 2020-08-21 ENCOUNTER — Encounter (HOSPITAL_COMMUNITY): Payer: Self-pay | Admitting: Internal Medicine

## 2020-08-21 DIAGNOSIS — E876 Hypokalemia: Secondary | ICD-10-CM | POA: Diagnosis not present

## 2020-08-21 DIAGNOSIS — R778 Other specified abnormalities of plasma proteins: Secondary | ICD-10-CM | POA: Diagnosis not present

## 2020-08-21 DIAGNOSIS — I10 Essential (primary) hypertension: Secondary | ICD-10-CM | POA: Diagnosis not present

## 2020-08-21 DIAGNOSIS — R0600 Dyspnea, unspecified: Secondary | ICD-10-CM | POA: Diagnosis not present

## 2020-08-21 LAB — BASIC METABOLIC PANEL
Anion gap: 8 (ref 5–15)
BUN: 24 mg/dL — ABNORMAL HIGH (ref 8–23)
CO2: 27 mmol/L (ref 22–32)
Calcium: 9.1 mg/dL (ref 8.9–10.3)
Chloride: 105 mmol/L (ref 98–111)
Creatinine, Ser: 0.77 mg/dL (ref 0.44–1.00)
GFR, Estimated: >60 mL/min (ref >60)
Glucose, Bld: 156 mg/dL — ABNORMAL HIGH (ref 70–99)
Potassium: 3.7 mmol/L (ref 3.5–5.1)
Sodium: 140 mmol/L (ref 135–145)

## 2020-08-21 LAB — URINE CULTURE: Culture: NO GROWTH

## 2020-08-21 MED ORDER — POTASSIUM CHLORIDE CRYS ER 20 MEQ PO TBCR
30.0000 meq | EXTENDED_RELEASE_TABLET | Freq: Once | ORAL | Status: AC
  Administered 2020-08-21: 30 meq via ORAL
  Filled 2020-08-21: qty 1

## 2020-08-21 NOTE — Progress Notes (Signed)
PT Cancellation Note  Patient Details Name: [name] MRN: [id number] DOB: Nov 10, 1940   Cancelled Treatment:    Reason Eval/Treat Not Completed: Patient at procedure or test/unavailable pt at MRI and unavailable for PT. Will attempt to return as/if time and schedule allow.    Windell Norfolk, DPT, PN1   Supplemental Physical Therapist [facility]    Pager [phone] Acute Rehab Office [phone]

## 2020-08-21 NOTE — Progress Notes (Signed)
Inpatient Rehab Admissions Coordinator:   Consult received and chart reviewed. [employer] unlikely to approve pt's diagnosis for CIR, based on current trends, and note that bed availability is extremely limited this week.  Recommend pt/family pursue alternative venues for post-acute rehab.  Will sign off for CIR at this time.    Shann Medal, PT, DPT Admissions Coordinator [phone] 08/21/20  8:08 PM

## 2020-08-21 NOTE — Progress Notes (Signed)
PROGRESS NOTE  [name] CVE:[id number] DOB: 04-Nov-1940 DOA: 08/19/2020 PCP: Orpah Melter, MD   LOS: 2 days   Brief Narrative / Interim history: 80 year old with past medical history significant for hypertension, aortic aneurysm, anxiety with depression, GERD who was brought by son from home with significant weakness, dyspnea on exertion and urinary frequency. Patient report having excessive urination at night.  She has been feeling weak and unable to get out of bed easily. Evaluation in the ED patient was found to have a potassium of 2.6.  Patient was found to have elevation of troponin.  Subjective / 24h Interval events: She is still feeling weak this morning, complains of having 2 loose bowel movements overnight states that she got some Senokot yesterday  Assessment & Plan: Principal Problem Hypokalemia, generalized weakness -Likely due to diuretics, they are now on hold.  Potassium proved to 3.7 this morning, will give 30 more mEq with goal 4.0 -Weakness still persistent this morning, PT recommending CIR, consult pending  Active Problems Troponin elevation, dyspnea on exertion -Cardiology consulted, appreciate input.  No further work-up necessary  Mild memory problems -Patient son tells me he is suspecting some degree of early dementia as patient has been having worsening memory problems, he also is not sure whether she is taking the medications as scheduled at home, is increasingly concerned about her safety.  He is looking into independent living as an outpatient  Urinary frequency -No UTI on urinalysis, likely due to home diuretics.  This is a chronic issue for her and patient's son told me she has been wearing depends for a number of years  Hyperlipidemia -Continue statin   Scheduled Meds: . enoxaparin (LOVENOX) injection  40 mg Subcutaneous Daily  . pantoprazole  40 mg Oral Daily  . rosuvastatin  10 mg Oral Daily   Continuous Infusions: PRN Meds:.acetaminophen  **OR** acetaminophen, loratadine, ondansetron **OR** ondansetron (ZOFRAN) IV  Diet Orders (From admission, onward)    Start     Ordered   08/19/20 2351  Diet Heart Room service appropriate? Yes; Fluid consistency: Thin  Diet effective now       Question Answer Comment  Room service appropriate? Yes   Fluid consistency: Thin      08/19/20 2350          DVT prophylaxis: enoxaparin (LOVENOX) injection 40 mg Start: 08/20/20 1000     Code Status: Full Code  Family Communication: son at bedside   Status is: Inpatient  Remains inpatient appropriate because:Inpatient level of care appropriate due to severity of illness   Dispo: The patient is from: Home              Anticipated d/c is to: CIR              Anticipated d/c date is: 2 days              Patient currently is not medically stable to d/c.   Difficult to place patient No  Level of care: Telemetry Cardiac  Consultants:  Cardiology   Procedures:  None   Microbiology  None   Antimicrobials: None     Objective: Vitals:   08/20/20 2002 08/20/20 2351 08/21/20 0443 08/21/20 1216  BP: (!) 127/59 130/76 126/84 131/65  Pulse: 80 78 78 86  Resp: (!) 21 20 (!) 22 16  Temp: 99 F (37.2 C) 98.4 F (36.9 C) 98.2 F (36.8 C) 99.4 F (37.4 C)  TempSrc: Oral Oral Oral Oral  SpO2: 98% 93% 95% 95%  Weight:      Height:        Intake/Output Summary (Last 24 hours) at 08/21/2020 1342 Last data filed at 08/21/2020 0644 Gross per 24 hour  Intake --  Output 200 ml  Net -200 ml   Filed Weights   08/19/20 1934 08/20/20 0346  Weight: 80.7 kg 81.6 kg    Examination:  Constitutional: NAD Eyes: no scleral icterus ENMT: Mucous membranes are moist.  Neck: normal, supple Respiratory: clear to auscultation bilaterally, no wheezing, no crackles.  Cardiovascular: Regular rate and rhythm, no murmurs / rubs / gallops. No LE edema.  Abdomen: non distended, no tenderness. Bowel sounds positive.  Musculoskeletal: no  clubbing / cyanosis.  Skin: no rashes Neurologic: CN 2-12 grossly intact. Strength 5/5 in all 4.  Psychiatric: Normal judgment and insight. Alert and oriented x 3. Normal mood.    Data Reviewed: I have independently reviewed following labs and imaging studies   CBC: Recent Labs  Lab 08/19/20 1943 08/19/20 2022 08/20/20 0053 08/20/20 0352  WBC 11.5*  --  10.9* 8.8  NEUTROABS 8.6*  --   --   --   HGB 12.5 11.6* 11.8* 11.0*  HCT 39.4 34.0* 34.5* 34.1*  MCV 101.5*  --  99.1 99.7  PLT 265  --  265 989   Basic Metabolic Panel: Recent Labs  Lab 08/19/20 1943 08/19/20 2022 08/19/20 2230 08/20/20 0053 08/20/20 0352 08/21/20 1034  NA 141 142  --   --  139 140  K 2.6* 2.6*  --   --  4.0 3.7  CL 101 101  --   --  104 105  CO2 26  --   --   --  27 27  GLUCOSE 117* 114*  --   --  109* 156*  BUN 36* 37*  --   --  37* 24*  CREATININE 0.71 0.70  --  0.93 0.72 0.77  CALCIUM 9.6  --   --   --  9.1 9.1  MG  --   --  2.0  --   --   --    Liver Function Tests: Recent Labs  Lab 08/19/20 1943 08/20/20 0352  AST 27 24  ALT 23 18  ALKPHOS 44 40  BILITOT 0.9 1.1  PROT 6.2* 5.6*  ALBUMIN 3.5 3.1*   Coagulation Profile: No results for input(s): INR, PROTIME in the last 168 hours. HbA1C: No results for input(s): HGBA1C in the last 72 hours. CBG: No results for input(s): GLUCAP in the last 168 hours.  Recent Results (from the past 240 hour(s))  Resp Panel by RT-PCR (Flu A&B, Covid) Nasopharyngeal Swab     Status: None   Collection Time: 08/20/20  1:06 AM   Specimen: Nasopharyngeal Swab; Nasopharyngeal(NP) swabs in vial transport medium  Result Value Ref Range Status   SARS Coronavirus 2 by RT PCR NEGATIVE NEGATIVE Final    Comment: (NOTE) SARS-CoV-2 target nucleic acids are NOT DETECTED.  The SARS-CoV-2 RNA is generally detectable in upper respiratory specimens during the acute phase of infection. The lowest concentration of SARS-CoV-2 viral copies this assay can detect  is 138 copies/mL. A negative result does not preclude SARS-Cov-2 infection and should not be used as the sole basis for treatment or other patient management decisions. A negative result may occur with  improper specimen collection/handling, submission of specimen other than nasopharyngeal swab, presence of viral mutation(s) within the areas targeted by this assay, and inadequate number of viral copies(<138 copies/mL). A negative result  must be combined with clinical observations, patient history, and epidemiological information. The expected result is Negative.  Fact Sheet for Patients:  EntrepreneurPulse.com.au  Fact Sheet for Healthcare Providers:  IncredibleEmployment.be  This test is no t yet approved or cleared by the [place] FDA and  has been authorized for detection and/or diagnosis of SARS-CoV-2 by FDA under an Emergency Use Authorization (EUA). This EUA will remain  in effect (meaning this test can be used) for the duration of the COVID-19 declaration under Section 564(b)(1) of the Act, 21 U.S.C.section 360bbb-3(b)(1), unless the authorization is terminated  or revoked sooner.       Influenza A by PCR NEGATIVE NEGATIVE Final   Influenza B by PCR NEGATIVE NEGATIVE Final    Comment: (NOTE) The Xpert Xpress SARS-CoV-2/FLU/RSV plus assay is intended as an aid in the diagnosis of influenza from Nasopharyngeal swab specimens and should not be used as a sole basis for treatment. Nasal washings and aspirates are unacceptable for Xpert Xpress SARS-CoV-2/FLU/RSV testing.  Fact Sheet for Patients: EntrepreneurPulse.com.au  Fact Sheet for Healthcare Providers: IncredibleEmployment.be  This test is not yet approved or cleared by the [place] FDA and has been authorized for detection and/or diagnosis of SARS-CoV-2 by FDA under an Emergency Use Authorization (EUA). This EUA will remain in effect  (meaning this test can be used) for the duration of the COVID-19 declaration under Section 564(b)(1) of the Act, 21 U.S.C. section 360bbb-3(b)(1), unless the authorization is terminated or revoked.  Performed at [facility], [employer] [address] [place], [place] [address]   Urine culture     Status: None   Collection Time: 08/20/20  3:08 PM   Specimen: Urine, Random  Result Value Ref Range Status   Specimen Description URINE, RANDOM  Final   Special Requests NONE  Final   Culture   Final    NO GROWTH Performed at [facility], [place] [address], [place], [place] [address]    Report Status 08/21/2020 FINAL  Final     Radiology Studies: MR BRAIN WO CONTRAST  Result Date: 08/21/2020 CLINICAL DATA:  Confusion EXAM: MRI HEAD WITHOUT CONTRAST TECHNIQUE: Multiplanar, multiecho pulse sequences of the brain and surrounding structures were obtained without intravenous contrast. COMPARISON:  None. FINDINGS: Brain: There is no acute infarction or intracranial hemorrhage. There is no intracranial mass, mass effect, or edema. There is no hydrocephalus or extra-axial fluid collection. Prominence of the ventricles sulci reflects generalized parenchymal volume loss. Patchy and confluent areas of T2 hyperintensity in the supratentorial white matter are nonspecific but probably reflect moderate chronic microvascular ischemic changes. There is a chronic small vessel infarct of the periventricular white matter adjacent to body of ventricle. Vascular: Major vessel flow voids at the skull base are preserved. Skull and upper cervical spine: Normal marrow signal is preserved. Sinuses/Orbits: Minor mucosal thickening.  Orbits are unremarkable. Other: Sella is unremarkable.  Mastoid air cells are clear. IMPRESSION: No evidence of recent infarction, hemorrhage, or mass. Moderate chronic microvascular ischemic changes. Electronically Signed   By: Macy Mis M.D.   On: 08/21/2020 12:09    Marzetta Board, MD,  PhD [facility]  Between 7 am - 7 pm I am available, please contact me via Amion or Securechat  Between 7 pm - 7 am I am not available, please contact night coverage MD/APP via Amion

## 2020-08-21 NOTE — Evaluation (Signed)
Physical Therapy Evaluation Patient Details Name: [name] MRN: [id number] DOB: [birth year] Today's Date: 08/21/2020   History of Present Illness  80yo female brought to the ED by her son due to significant weakness, DOE, and increased urinary frequency. EKG with L BBB but per cardio note this is chronic. Found to have severe  hypokalemia. PMH anxiety and depression, AAA, skin cancer, hx clavicle fracture, HTN, osteoporosis, L rotator cuff repair, THA  Clinical Impression   Patient received in bed, pleasant and cooperative but requiring increased time for processing and sequencing for all mobility tasks. Able to mobilize on a Min-ModA level with RW but heavily reliant on external support and device for balance. Very easily fatigued and limited by knee pain. Left up in recliner with all needs met, chair alarm active. May benefit from intensive therapies in CIR setting, otherwise would require SNF prior to return home.     Follow Up Recommendations CIR    Equipment Recommendations  Rolling walker with 5" wheels;3in1 (PT);Wheelchair cushion (measurements PT);Wheelchair (measurements PT)    Recommendations for Other Services       Precautions / Restrictions Precautions Precautions: Fall;Other (comment) Precaution Comments: L shoulder pain Restrictions Weight Bearing Restrictions: No      Mobility  Bed Mobility Overal bed mobility: Needs Assistance Bed Mobility: Supine to Sit     Supine to sit: Min assist;HOB elevated     General bed mobility comments: to pivot hips around to EOB and weight shift up to midline, use of rail    Transfers Overall transfer level: Needs assistance Equipment used: Rolling walker (2 wheeled) Transfers: Sit to/from [facility] Sit to Stand: Mod assist;From elevated surface Stand pivot transfers: Min assist       General transfer comment: ModA and cues for hand placement/sequencing for transfers- needed additional cues to hold  onto RW at safe/appropriate points instead of leaning on RW with forearms; MinA for balance and RW management for transfer into recliner  Ambulation/Gait             General Gait Details: deferred- increased knee pain with transfers, fatigue  Stairs            Wheelchair Mobility    Modified Rankin (Stroke Patients Only)       Balance Overall balance assessment: Needs assistance;History of Falls Sitting-balance support: Feet supported;Bilateral upper extremity supported Sitting balance-Leahy Scale: Fair Sitting balance - Comments: able to maintain midline statically, did not challenge dynamically   Standing balance support: Bilateral upper extremity supported;During functional activity Standing balance-Leahy Scale: Poor Standing balance comment: heavy reliance on BUE and external support                             Pertinent Vitals/Pain Pain Assessment: Faces Faces Pain Scale: Hurts little more Pain Location: generalized, especially in knees in standing Pain Descriptors / Indicators: Aching;Sore Pain Intervention(s): Limited activity within patient's tolerance;Monitored during session    [place] expects to be discharged to:: Private residence Living Arrangements: Alone Available Help at Discharge: Family;Available PRN/intermittently Type of Home: House Home Access: Stairs to enter Entrance Stairs-Rails: Right Entrance Stairs-Number of Steps: 5 Home Layout: Multi-level Home Equipment: Walker - 2 wheels;Cane - quad      Prior Function Level of Independence: Independent               Hand Dominance        Extremity/Trunk Assessment   Upper Extremity Assessment Upper  Extremity Assessment: Defer to OT evaluation    Lower Extremity Assessment Lower Extremity Assessment: Generalized weakness    Cervical / Trunk Assessment Cervical / Trunk Assessment: Kyphotic  Communication   Communication: No difficulties   Cognition Arousal/Alertness: Awake/alert Behavior During Therapy: Flat affect Overall Cognitive Status: Impaired/Different from baseline Area of Impairment: Orientation;Attention;Memory;Following commands;Safety/judgement;Awareness;Problem solving                 Orientation Level: Disoriented to;Time (disoriented to month) Current Attention Level: Selective   Following Commands: Follows one step commands consistently;Follows one step commands with increased time;Follows multi-step commands with increased time Safety/Judgement: Decreased awareness of safety;Decreased awareness of deficits Awareness: Emergent Problem Solving: Difficulty sequencing;Requires verbal cues;Requires tactile cues;Slow processing General Comments: pleasant and cooperative      General Comments General comments (skin integrity, edema, etc.): VSS on RA    Exercises     Assessment/Plan    PT Assessment Patient needs continued PT services  PT Problem List Decreased strength;Obesity;Decreased activity tolerance;Decreased safety awareness;Decreased balance;Pain;Decreased mobility;Decreased coordination       PT Treatment Interventions DME instruction;Balance training;Gait training;Stair training;Functional mobility training;Patient/family education;Therapeutic activities;Therapeutic exercise;Manual techniques    PT Goals (Current goals can be found in the Care Plan section)  Acute Rehab PT Goals Patient Stated Goal: get stronger PT Goal Formulation: With patient Time For Goal Achievement: 09/04/20 Potential to Achieve Goals: Good    Frequency Min 3X/week   Barriers to discharge        Co-evaluation               AM-PAC PT "6 Clicks" Mobility  Outcome Measure Help needed turning from your back to your side while in a flat bed without using bedrails?: A Little Help needed moving from lying on your back to sitting on the side of a flat bed without using bedrails?: A Lot Help needed  moving to and from a bed to a chair (including a wheelchair)?: A Lot Help needed standing up from a chair using your arms (e.g., wheelchair or bedside chair)?: A Lot Help needed to walk in hospital room?: Total Help needed climbing 3-5 steps with a railing? : Total 6 Click Score: 11    End of Session Equipment Utilized During Treatment: Gait belt Activity Tolerance: Patient limited by fatigue Patient left: in chair;with call bell/phone within reach;with chair alarm set Nurse Communication: Mobility status PT Visit Diagnosis: Unsteadiness on feet (R26.81);Difficulty in walking, not elsewhere classified (R26.2);Muscle weakness (generalized) (M62.81);History of falling (Z91.81)    Time: 8295-6213 PT Time Calculation (min) (ACUTE ONLY): 32 min   Charges:   PT Evaluation $PT Eval Moderate Complexity: 1 Mod PT Treatments $Therapeutic Activity: 8-22 mins        Windell Norfolk, DPT, PN1   Supplemental Physical Therapist [place]    Pager [phone] Acute Rehab Office [phone]

## 2020-08-22 DIAGNOSIS — E876 Hypokalemia: Secondary | ICD-10-CM | POA: Diagnosis not present

## 2020-08-22 DIAGNOSIS — I10 Essential (primary) hypertension: Secondary | ICD-10-CM | POA: Diagnosis not present

## 2020-08-22 DIAGNOSIS — R0600 Dyspnea, unspecified: Secondary | ICD-10-CM | POA: Diagnosis not present

## 2020-08-22 DIAGNOSIS — R778 Other specified abnormalities of plasma proteins: Secondary | ICD-10-CM | POA: Diagnosis not present

## 2020-08-22 LAB — BASIC METABOLIC PANEL
Anion gap: 9 (ref 5–15)
BUN: 15 mg/dL (ref 8–23)
CO2: 27 mmol/L (ref 22–32)
Calcium: 8.7 mg/dL — ABNORMAL LOW (ref 8.9–10.3)
Chloride: 105 mmol/L (ref 98–111)
Creatinine, Ser: 0.63 mg/dL (ref 0.44–1.00)
GFR, Estimated: >60 mL/min (ref >60)
Glucose, Bld: 98 mg/dL (ref 70–99)
Potassium: 3.4 mmol/L — ABNORMAL LOW (ref 3.5–5.1)
Sodium: 141 mmol/L (ref 135–145)

## 2020-08-22 LAB — CBC
HCT: 31.1 % — ABNORMAL LOW (ref 36.0–46.0)
Hemoglobin: 10 g/dL — ABNORMAL LOW (ref 12.0–15.0)
MCH: 32.4 pg (ref 26.0–34.0)
MCHC: 32.2 g/dL (ref 30.0–36.0)
MCV: 100.6 fL — ABNORMAL HIGH (ref 80.0–100.0)
Platelets: 226 10*3/uL (ref 150–400)
RBC: 3.09 MIL/uL — ABNORMAL LOW (ref 3.87–5.11)
RDW: 14.5 % (ref 11.5–15.5)
WBC: 6.3 10*3/uL (ref 4.0–10.5)
nRBC: 0 % (ref 0.0–0.2)

## 2020-08-22 MED ORDER — POTASSIUM CHLORIDE CRYS ER 20 MEQ PO TBCR
40.0000 meq | EXTENDED_RELEASE_TABLET | Freq: Once | ORAL | Status: AC
  Administered 2020-08-22: 40 meq via ORAL
  Filled 2020-08-22: qty 2

## 2020-08-22 NOTE — Progress Notes (Signed)
Pt refused to have cardiac monitor on. She pulled all the leads off her chest and stated she is probably going home tomorrow and doesn't need telemetry on her. Pt appears to be poor historian. She is alert to person, place, and date but appears to not fully understand her situation.

## 2020-08-22 NOTE — Progress Notes (Signed)
PROGRESS NOTE  [name] HUT:[id number] DOB: [birth year] DOA: 08/19/2020 PCP: Orpah Melter, MD   LOS: 3 days   Brief Narrative / Interim history: 80 year old with past medical history significant for hypertension, aortic aneurysm, anxiety with depression, GERD who was brought by son from home with significant weakness, dyspnea on exertion and urinary frequency. Patient report having excessive urination at night.  She has been feeling weak and unable to get out of bed easily. Evaluation in the ED patient was found to have a potassium of 2.6.  Patient was found to have elevation of troponin.  Subjective / 24h Interval events: Feeling overall well today, complains of persistent weakness  Assessment & Plan: Principal Problem Hypokalemia, generalized weakness -Likely due to diuretics, they are now on hold.  Potassium still low at 3.4, continue to replete -PT recommending CIR, however CIR recommending SNF.  Placement pending  Active Problems Troponin elevation, dyspnea on exertion -Cardiology consulted, appreciate input.  No further work-up necessary  Mild memory problems -Patient son tells me he is suspecting some degree of early dementia as patient has been having worsening memory problems, he also is not sure whether she is taking the medications as scheduled at home, is increasingly concerned about her safety.  He is looking into independent living as an outpatient but for now needs SNF  Urinary frequency -No UTI on urinalysis, likely due to home diuretics.  This is a chronic issue for her and patient's son told me she has been wearing depends for a number of years  Hyperlipidemia -Continue statin   Scheduled Meds: . enoxaparin (LOVENOX) injection  40 mg Subcutaneous Daily  . pantoprazole  40 mg Oral Daily  . rosuvastatin  10 mg Oral Daily   Continuous Infusions: PRN Meds:.acetaminophen **OR** acetaminophen, loratadine, ondansetron **OR** ondansetron (ZOFRAN) IV  Diet  Orders (From admission, onward)    Start     Ordered   08/19/20 2351  Diet Heart Room service appropriate? Yes; Fluid consistency: Thin  Diet effective now       Question Answer Comment  Room service appropriate? Yes   Fluid consistency: Thin      08/19/20 2350          DVT prophylaxis: enoxaparin (LOVENOX) injection 40 mg Start: 08/20/20 1000     Code Status: Full Code  Family Communication: son at bedside   Status is: Inpatient  Remains inpatient appropriate because:Inpatient level of care appropriate due to severity of illness   Dispo: The patient is from: Home              Anticipated d/c is to: CIR              Anticipated d/c date is: 2 days              Patient currently is not medically stable to d/c.   Difficult to place patient No  Level of care: Telemetry Cardiac  Consultants:  Cardiology   Procedures:  None   Microbiology  None   Antimicrobials: None     Objective: Vitals:   08/21/20 1216 08/21/20 1817 08/21/20 2314 08/22/20 0520  BP: 131/65 (!) 138/59 (!) 149/74 (!) 153/89  Pulse: 86 86 84 83  Resp: 16 16 19 18   Temp: 99.4 F (37.4 C) 98.9 F (37.2 C) 98.2 F (36.8 C) 98.6 F (37 C)  TempSrc: Oral Oral Oral Oral  SpO2: 95% 95% 95% 94%  Weight:      Height:  Intake/Output Summary (Last 24 hours) at 08/22/2020 0925 Last data filed at 08/21/2020 2315 Gross per 24 hour  Intake -  Output 500 ml  Net -500 ml   Filed Weights   08/19/20 1934 08/20/20 0346  Weight: 80.7 kg 81.6 kg    Examination:  Constitutional: No distress Eyes: No icterus ENMT: mmm Neck: normal, supple Respiratory: Lungs are clear bilaterally without wheezing or crackles Cardiovascular: Regular rate and rhythm, no murmurs, no edema Abdomen: Soft, nontender, nondistended, bowel sounds positive Musculoskeletal: no clubbing / cyanosis.  Skin: no rashes Neurologic: Nonfocal, equal strength  Data Reviewed: I have independently reviewed following labs and  imaging studies   CBC: Recent Labs  Lab 08/19/20 1943 08/19/20 2022 08/20/20 0053 08/20/20 0352 08/22/20 0317  WBC 11.5*  --  10.9* 8.8 6.3  NEUTROABS 8.6*  --   --   --   --   HGB 12.5 11.6* 11.8* 11.0* 10.0*  HCT 39.4 34.0* 34.5* 34.1* 31.1*  MCV 101.5*  --  99.1 99.7 100.6*  PLT 265  --  265 230 720   Basic Metabolic Panel: Recent Labs  Lab 08/19/20 1943 08/19/20 2022 08/19/20 2230 08/20/20 0053 08/20/20 0352 08/21/20 1034 08/22/20 0317  NA 141 142  --   --  139 140 141  K 2.6* 2.6*  --   --  4.0 3.7 3.4*  CL 101 101  --   --  104 105 105  CO2 26  --   --   --  27 27 27   GLUCOSE 117* 114*  --   --  109* 156* 98  BUN 36* 37*  --   --  37* 24* 15  CREATININE 0.71 0.70  --  0.93 0.72 0.77 0.63  CALCIUM 9.6  --   --   --  9.1 9.1 8.7*  MG  --   --  2.0  --   --   --   --    Liver Function Tests: Recent Labs  Lab 08/19/20 1943 08/20/20 0352  AST 27 24  ALT 23 18  ALKPHOS 44 40  BILITOT 0.9 1.1  PROT 6.2* 5.6*  ALBUMIN 3.5 3.1*   Coagulation Profile: No results for input(s): INR, PROTIME in the last 168 hours. HbA1C: No results for input(s): HGBA1C in the last 72 hours. CBG: No results for input(s): GLUCAP in the last 168 hours.  Recent Results (from the past 240 hour(s))  Resp Panel by RT-PCR (Flu A&B, Covid) Nasopharyngeal Swab     Status: None   Collection Time: 08/20/20  1:06 AM   Specimen: Nasopharyngeal Swab; Nasopharyngeal(NP) swabs in vial transport medium  Result Value Ref Range Status   SARS Coronavirus 2 by RT PCR NEGATIVE NEGATIVE Final    Comment: (NOTE) SARS-CoV-2 target nucleic acids are NOT DETECTED.  The SARS-CoV-2 RNA is generally detectable in upper respiratory specimens during the acute phase of infection. The lowest concentration of SARS-CoV-2 viral copies this assay can detect is 138 copies/mL. A negative result does not preclude SARS-Cov-2 infection and should not be used as the sole basis for treatment or other patient  management decisions. A negative result may occur with  improper specimen collection/handling, submission of specimen other than nasopharyngeal swab, presence of viral mutation(s) within the areas targeted by this assay, and inadequate number of viral copies(<138 copies/mL). A negative result must be combined with clinical observations, patient history, and epidemiological information. The expected result is Negative.  Fact Sheet for Patients:  EntrepreneurPulse.com.au  Fact  Sheet for Healthcare Providers:  IncredibleEmployment.be  This test is no t yet approved or cleared by the [place] FDA and  has been authorized for detection and/or diagnosis of SARS-CoV-2 by FDA under an Emergency Use Authorization (EUA). This EUA will remain  in effect (meaning this test can be used) for the duration of the COVID-19 declaration under Section 564(b)(1) of the Act, 21 U.S.C.section 360bbb-3(b)(1), unless the authorization is terminated  or revoked sooner.       Influenza A by PCR NEGATIVE NEGATIVE Final   Influenza B by PCR NEGATIVE NEGATIVE Final    Comment: (NOTE) The Xpert Xpress SARS-CoV-2/FLU/RSV plus assay is intended as an aid in the diagnosis of influenza from Nasopharyngeal swab specimens and should not be used as a sole basis for treatment. Nasal washings and aspirates are unacceptable for Xpert Xpress SARS-CoV-2/FLU/RSV testing.  Fact Sheet for Patients: EntrepreneurPulse.com.au  Fact Sheet for Healthcare Providers: IncredibleEmployment.be  This test is not yet approved or cleared by the [place] FDA and has been authorized for detection and/or diagnosis of SARS-CoV-2 by FDA under an Emergency Use Authorization (EUA). This EUA will remain in effect (meaning this test can be used) for the duration of the COVID-19 declaration under Section 564(b)(1) of the Act, 21 U.S.C. section 360bbb-3(b)(1),  unless the authorization is terminated or revoked.  Performed at [facility], [place] [address], [place], [place] [address]   Urine culture     Status: None   Collection Time: 08/20/20  3:08 PM   Specimen: Urine, Random  Result Value Ref Range Status   Specimen Description URINE, RANDOM  Final   Special Requests NONE  Final   Culture   Final    NO GROWTH Performed at [facility], [place] [address] [place], [place] [address]    Report Status 08/21/2020 FINAL  Final     Radiology Studies: MR BRAIN WO CONTRAST  Result Date: 08/21/2020 CLINICAL DATA:  Confusion EXAM: MRI HEAD WITHOUT CONTRAST TECHNIQUE: Multiplanar, multiecho pulse sequences of the brain and surrounding structures were obtained without intravenous contrast. COMPARISON:  None. FINDINGS: Brain: There is no acute infarction or intracranial hemorrhage. There is no intracranial mass, mass effect, or edema. There is no hydrocephalus or extra-axial fluid collection. Prominence of the ventricles sulci reflects generalized parenchymal volume loss. Patchy and confluent areas of T2 hyperintensity in the supratentorial white matter are nonspecific but probably reflect moderate chronic microvascular ischemic changes. There is a chronic small vessel infarct of the periventricular white matter adjacent to body of ventricle. Vascular: Major vessel flow voids at the skull base are preserved. Skull and upper cervical spine: Normal marrow signal is preserved. Sinuses/Orbits: Minor mucosal thickening.  Orbits are unremarkable. Other: Sella is unremarkable.  Mastoid air cells are clear. IMPRESSION: No evidence of recent infarction, hemorrhage, or mass. Moderate chronic microvascular ischemic changes. Electronically Signed   By: Macy Mis M.D.   On: 08/21/2020 12:09    Marzetta Board, MD, PhD [facility]  Between 7 am - 7 pm I am available, please contact me via Amion or Securechat  Between 7 pm - 7 am I am not available,  please contact night coverage MD/APP via Amion

## 2020-08-22 NOTE — NC FL2 (Signed)
[place] LEVEL OF CARE SCREENING TOOL     IDENTIFICATION  Patient Name: [name] Birthdate: [birth year] Sex: female Admission Date (Current Location): 08/19/2020  [facility] and [place] Number:  Herbalist and Address:  [facility], [place] [address] [place], [place] [address]      Provider Number: [id number]  Attending Physician Name and Address:  Caren Griffins, MD  Relative Name and Phone Number:       Current Level of Care: Hospital Recommended Level of Care: [place] Prior Approval Number:    Date Approved/Denied:   PASRR Number: Pending  Discharge Plan: SNF    Current Diagnoses: Patient Active Problem List   Diagnosis Date Noted  . GERD (gastroesophageal reflux disease) 08/19/2020  . Hypokalemia 08/19/2020  . Urinary frequency 08/19/2020  . Weakness 08/19/2020  . Dyspnea 08/19/2020  . Troponin I above reference range 08/19/2020  . Skin cancer 06/09/2017  . Essential hypertension 08/26/2015    Orientation RESPIRATION BLADDER Height & Weight     Self,Time,Situation,Place  Normal Continent Weight: 180 lb (81.6 kg) Height:  5' 3.5" (161.3 cm)  BEHAVIORAL SYMPTOMS/MOOD NEUROLOGICAL BOWEL NUTRITION STATUS      Continent Diet (See discharge summary)  AMBULATORY STATUS COMMUNICATION OF NEEDS Skin   Extensive Assist Verbally Normal                       Personal Care Assistance Level of Assistance  Bathing,Feeding,Dressing Bathing Assistance: Maximum assistance Feeding assistance: Limited assistance Dressing Assistance: Maximum assistance     Functional Limitations Info  Sight,Hearing,Speech Sight Info: Adequate Hearing Info: Adequate Speech Info: Adequate    SPECIAL CARE FACTORS FREQUENCY  OT (By licensed OT),PT (By licensed PT)     PT Frequency: 5x a week OT Frequency: 5x a week            Contractures Contractures Info: Not present    Additional Factors Info  Code  Status,Allergies Code Status Info: Full Allergies Info: Molds & Smuts   Other   Statins           Current Medications (08/22/2020):  This is the current hospital active medication list Current Facility-Administered Medications  Medication Dose Route Frequency Provider Last Rate Last Admin  . acetaminophen (TYLENOL) tablet 650 mg  650 mg Oral Q6H PRN Elwyn Reach, MD       Or  . acetaminophen (TYLENOL) suppository 650 mg  650 mg Rectal Q6H PRN Gala Romney L, MD      . enoxaparin (LOVENOX) injection 40 mg  40 mg Subcutaneous Daily Elwyn Reach, MD   40 mg at 08/22/20 0832  . loratadine (CLARITIN) tablet 10 mg  10 mg Oral Daily PRN Regalado, Belkys A, MD      . ondansetron (ZOFRAN) tablet 4 mg  4 mg Oral Q6H PRN Elwyn Reach, MD       Or  . ondansetron (ZOFRAN) injection 4 mg  4 mg Intravenous Q6H PRN Gala Romney L, MD      . pantoprazole (PROTONIX) EC tablet 40 mg  40 mg Oral Daily Regalado, Belkys A, MD   40 mg at 08/22/20 0832  . rosuvastatin (CRESTOR) tablet 10 mg  10 mg Oral Daily Regalado, Belkys A, MD   10 mg at 08/22/20 9629     Discharge Medications: Please see discharge summary for a list of discharge medications.  Relevant Imaging Results:  Relevant Lab Results:  Additional Information SSN [id number]  Emeterio Reeve, [place]

## 2020-08-22 NOTE — TOC Initial Note (Signed)
Transition of Care [facility]) - Initial/Assessment Note    Patient Details  Name: [name] MRN: [id number] Date of Birth: 1940/09/08  [facility]) CM/SW Contact:    Emeterio Reeve, [place] Phone Number: 08/22/2020, 3:38 PM  Clinical Narrative:                  CSW met with pt at bedside and pts on on the phone. CSW introduced self and explained her role at the hospital.  Pt reports she was living at home alone. Pt reports she was independent prior to hospitalization. Pt reports shes covid vaccinated.   CSW reviewed PT/OT reccs of SNF. Pt stated she wants CIR. CSW explained that that insurance may not cover CIR due to her diagnosis Pt stated that's what she wants. Pt son asked if there was an official denial from insurance, and if so he wants to appeal it. CSW explained she will have to reach out to [facility] admission coordinator to discuss with son and pt. CSW inquired about starting the SNF process as a back up. Pt and son agreed. Pt has been to snf a few years ago in [place]. Pt has no preference of facilities at this time. CSW gave pt medicare.gov rating list to review.  Pts Passr number is pending.  TOC will follow.   Expected Discharge Plan: Skilled Nursing Facility Barriers to Discharge: Continued Medical Work up   Patient Goals and CMS Choice Patient states their goals for this hospitalization and ongoing recovery are:: to return home CMS Medicare.gov Compare Post Acute Care list provided to:: Patient Choice offered to / list presented to : Patient  Expected Discharge Plan and Services Expected Discharge Plan: [place]       Living arrangements for the past 2 months: Single Family Home                                      Prior Living Arrangements/Services Living arrangements for the past 2 months: Single Family Home Lives with:: Self Patient language and need for interpreter reviewed:: Yes Do you feel safe going back to the place where  you live?: Yes      Need for Family Participation in Patient Care: Yes (Comment) Care giver support system in place?: Yes (comment)   Criminal Activity/Legal Involvement Pertinent to Current Situation/Hospitalization: No - Comment as needed  Activities of Daily Living Home Assistive Devices/Equipment: Eyeglasses ADL Screening (condition at time of admission) Patient's cognitive ability adequate to safely complete daily activities?: Yes Is the patient deaf or have difficulty hearing?: No Does the patient have difficulty seeing, even when wearing glasses/contacts?: No Does the patient have difficulty concentrating, remembering, or making decisions?: No Patient able to express need for assistance with ADLs?: No Does the patient have difficulty dressing or bathing?: No Independently performs ADLs?: Yes (appropriate for developmental age) Does the patient have difficulty walking or climbing stairs?: No Weakness of Legs: None Weakness of Arms/Hands: None  Permission Sought/Granted Permission sought to share information with : Chartered certified accountant granted to share information with : Yes, Verbal Permission Granted     Permission granted to share info w AGENCY: SNF  Permission granted to share info w Relationship: Son, Jonni Sanger     Emotional Assessment Appearance:: Appears stated age Attitude/Demeanor/Rapport: Engaged Affect (typically observed): Appropriate Orientation: : Oriented to Self,Oriented to Place,Oriented to  Time,Oriented to Situation Alcohol / Substance  Use: Not Applicable Psych Involvement: No (comment)  Admission diagnosis:  Hypokalemia [E87.6] Elevated troponin [R77.8] Generalized weakness [R53.1] Patient Active Problem List   Diagnosis Date Noted  . GERD (gastroesophageal reflux disease) 08/19/2020  . Hypokalemia 08/19/2020  . Urinary frequency 08/19/2020  . Weakness 08/19/2020  . Dyspnea 08/19/2020  . Troponin I above reference range 08/19/2020   . Skin cancer 06/09/2017  . Essential hypertension 08/26/2015   PCP:  Orpah Melter, MD Pharmacy:   [id number] [place] [place] [address]Phone: [phone]: [phone]    Social Determinants of Health (SDOH) Interventions    Readmission Risk Interventions No flowsheet data found.  MEmeterio Reeve LLatanya Presser LBroadmoorSocial Worker [phone]

## 2020-08-22 NOTE — Evaluation (Signed)
Occupational Therapy Evaluation Patient Details Name: [name] MRN: [id number] DOB: 1941-03-01 Today's Date: 08/22/2020    History of Present Illness 80yo female brought to the ED by her son due to significant weakness, DOE, and increased urinary frequency. EKG with L BBB but per cardio note this is chronic. Found to have severe  hypokalemia. PMH anxiety and depression, AAA, skin cancer, hx clavicle fracture, HTN, osteoporosis, L rotator cuff repair, THA   Clinical Impression   Pt admitted with the above diagnoses and presents with below problem list. Pt will benefit from continued acute OT to address the below listed deficits and maximize independence with basic ADLs prior to d/c to venue below. At baseline, pt is independent with ADLs. Pt presents with generalized BLE>BUE weakess, decreased activity tolerance, impaired balance, and impaired cognition(? Unclear baseline with cognition). Pt is currently up to mod A with LB ADLs and functional transfers, close min guard assist to walk distance to the bathroom utilizing rw.     Follow Up Recommendations  SNF    Equipment Recommendations  Other (comment) (defer to next venue)    Recommendations for Other Services       Precautions / Restrictions Precautions Precautions: Fall;Other (comment) Precaution Comments: L shoulder pain Restrictions Weight Bearing Restrictions: No      Mobility Bed Mobility Overal bed mobility: Needs Assistance Bed Mobility: Supine to Sit     Supine to sit: Min assist;HOB elevated     General bed mobility comments: min A to fully powerup trunk to upright position Extra time and effort but no physical assist to scoot hips to full EOB position.    Transfers Overall transfer level: Needs assistance Equipment used: Rolling walker (2 wheeled) Transfers: Sit to/from Stand Sit to Stand: Mod assist;Min assist         General transfer comment: cues for hand placement with rw, tends to go for holding  rw across the front able to maintain correct hand position once in place. to/from EOB and recliner. Mod A on intital stand to powerup up, min A to control descent into recliner seat. Difficulty sequencing movements with rw, multimodal cues needed at times    Balance Overall balance assessment: Needs assistance;History of Falls Sitting-balance support: Feet supported;Bilateral upper extremity supported Sitting balance-Leahy Scale: Fair     Standing balance support: Bilateral upper extremity supported;During functional activity Standing balance-Leahy Scale: Poor Standing balance comment: heavy reliance on BUE support                           ADL either performed or assessed with clinical judgement   ADL Overall ADL's : Needs assistance/impaired Eating/Feeding: Set up;Sitting   Grooming: Set up;Sitting   Upper Body Bathing: Set up;Sitting   Lower Body Bathing: Moderate assistance;Sit to/from stand   Upper Body Dressing : Set up;Sitting   Lower Body Dressing: Moderate assistance;Sit to/from stand   Toilet Transfer: Minimal assistance;Ambulation;Comfort height toilet;RW;Grab bars   Toileting- Clothing Manipulation and Hygiene: Minimal assistance;Moderate assistance;Sitting/lateral lean;Sit to/from stand   Tub/ Shower Transfer: Minimal assistance;Ambulation;Rolling walker;3 in 1   Functional mobility during ADLs: Min guard;Rolling walker General ADL Comments: close min guard assist. pt appearing unsteady on turns and as she fatigued but no physical A needed to correct balance. Utilized rw. Walked bathroom distance only this session. Decreased activity tolerance + decreased insight into deficits and impaired problem solving.     Vision Baseline Vision/History: Wears glasses       Perception  Praxis      Pertinent Vitals/Pain Pain Assessment: Faces Faces Pain Scale: Hurts little more Pain Location: L shoulder, RLE "cramping" Pain Descriptors / Indicators:  Cramping;Aching Pain Intervention(s): Limited activity within patient's tolerance;Monitored during session;Repositioned     Hand Dominance     Extremity/Trunk Assessment Upper Extremity Assessment Upper Extremity Assessment: Generalized weakness;RUE deficits/detail RUE Deficits / Details: R shoulder pain per pt at rest, not affected by ROM. Reports she finds relief by "getting the shoulder in the right position"   Lower Extremity Assessment Lower Extremity Assessment: Defer to PT evaluation       Communication Communication Communication: No difficulties   Cognition Arousal/Alertness: Awake/alert Behavior During Therapy: Flat affect Overall Cognitive Status: No family/caregiver present to determine baseline cognitive functioning Area of Impairment: Orientation;Attention;Memory;Following commands;Safety/judgement;Awareness;Problem solving                 Orientation Level: Disoriented to;Time Current Attention Level: Selective Memory: Decreased short-term memory Following Commands: Follows one step commands consistently;Follows one step commands with increased time;Follows multi-step commands inconsistently Safety/Judgement: Decreased awareness of safety;Decreased awareness of deficits Awareness: Emergent Problem Solving: Difficulty sequencing;Requires verbal cues;Requires tactile cues;Slow processing General Comments: no family present to determine baseline cognition.   General Comments       Exercises     Shoulder Instructions      Home Living Family/patient expects to be discharged to:: Private residence Living Arrangements: Alone Available Help at Discharge: Family;Available PRN/intermittently Type of Home: House Home Access: Stairs to enter [employer] of Steps: 5 Entrance Stairs-Rails: Right Home Layout: Multi-level Alternate Level Stairs-Number of Steps: 10 Alternate Level Stairs-Rails: Right Bathroom Shower/Tub: Radiographer, therapeutic: Standard Bathroom Accessibility: Yes   Home Equipment: Environmental consultant - 2 wheels;Cane - quad          Prior Functioning/Environment Level of Independence: Independent                 OT Problem List: Decreased strength;Decreased activity tolerance;Impaired balance (sitting and/or standing);Decreased range of motion;Decreased cognition;Decreased safety awareness;Decreased knowledge of use of DME or AE;Decreased knowledge of precautions;Pain;Impaired UE functional use      OT Treatment/Interventions: Self-care/ADL training;Therapeutic exercise;Energy conservation;DME and/or AE instruction;Therapeutic activities;Patient/family education;Balance training    OT Goals(Current goals can be found in the care plan section) Acute Rehab OT Goals Patient Stated Goal: get stronger OT Goal Formulation: With patient Time For Goal Achievement: 09/05/20 Potential to Achieve Goals: Good ADL Goals Pt Will Perform Grooming: with modified independence;standing Pt Will Perform Lower Body Bathing: with modified independence;sit to/from stand Pt Will Perform Lower Body Dressing: with modified independence;sit to/from stand Pt Will Transfer to Toilet: with modified independence;ambulating Pt Will Perform Toileting - Clothing Manipulation and hygiene: with modified independence;sit to/from stand;sitting/lateral leans Pt Will Perform Tub/Shower Transfer: rolling walker;with supervision;ambulating;shower seat  OT Frequency: Min 2X/week   Barriers to D/C:            Co-evaluation              AM-PAC OT "6 Clicks" Daily Activity     Outcome Measure Help from another person eating meals?: None Help from another person taking care of personal grooming?: None Help from another person toileting, which includes using toliet, bedpan, or urinal?: A Lot Help from another person bathing (including washing, rinsing, drying)?: A Lot Help from another person to put on and taking off regular upper body  clothing?: A Little Help from another person to put on and taking off regular lower body  clothing?: A Lot 6 Click Score: 17   End of Session Equipment Utilized During Treatment: Surveyor, mining Communication: Mobility status;Other (comment) (cognition)  Activity Tolerance: Patient limited by fatigue;Patient tolerated treatment well Patient left: in chair;with call bell/phone within reach;with chair alarm set  OT Visit Diagnosis: Unsteadiness on feet (R26.81);Muscle weakness (generalized) (M62.81);Pain;Other symptoms and signs involving cognitive function                Time: 0939-1003 OT Time Calculation (min): 24 min Charges:  OT General Charges $OT Visit: 1 Visit OT Evaluation $OT Eval Low Complexity: 1 Low OT Treatments $Self Care/Home Management : 8-22 mins  Tyrone Schimke, OT Acute Rehabilitation Services Pager: [phone] Office: [phone]   Hortencia Pilar 08/22/2020, 10:30 AM

## 2020-08-23 DIAGNOSIS — E876 Hypokalemia: Secondary | ICD-10-CM | POA: Diagnosis not present

## 2020-08-23 DIAGNOSIS — R778 Other specified abnormalities of plasma proteins: Secondary | ICD-10-CM | POA: Diagnosis not present

## 2020-08-23 DIAGNOSIS — I10 Essential (primary) hypertension: Secondary | ICD-10-CM | POA: Diagnosis not present

## 2020-08-23 LAB — BASIC METABOLIC PANEL
Anion gap: 8 (ref 5–15)
BUN: 12 mg/dL (ref 8–23)
CO2: 30 mmol/L (ref 22–32)
Calcium: 9.2 mg/dL (ref 8.9–10.3)
Chloride: 102 mmol/L (ref 98–111)
Creatinine, Ser: 0.68 mg/dL (ref 0.44–1.00)
GFR, Estimated: >60 mL/min (ref >60)
Glucose, Bld: 106 mg/dL — ABNORMAL HIGH (ref 70–99)
Potassium: 4.3 mmol/L (ref 3.5–5.1)
Sodium: 140 mmol/L (ref 135–145)

## 2020-08-23 LAB — CBC
HCT: 33.4 % — ABNORMAL LOW (ref 36.0–46.0)
Hemoglobin: 10.6 g/dL — ABNORMAL LOW (ref 12.0–15.0)
MCH: 32 pg (ref 26.0–34.0)
MCHC: 31.7 g/dL (ref 30.0–36.0)
MCV: 100.9 fL — ABNORMAL HIGH (ref 80.0–100.0)
Platelets: 257 10*3/uL (ref 150–400)
RBC: 3.31 MIL/uL — ABNORMAL LOW (ref 3.87–5.11)
RDW: 14.4 % (ref 11.5–15.5)
WBC: 6.6 10*3/uL (ref 4.0–10.5)
nRBC: 0 % (ref 0.0–0.2)

## 2020-08-23 MED ORDER — CARVEDILOL 12.5 MG PO TABS
12.5000 mg | ORAL_TABLET | Freq: Two times a day (BID) | ORAL | Status: DC
  Administered 2020-08-23 – 2020-08-27 (×9): 12.5 mg via ORAL
  Filled 2020-08-23 (×9): qty 1

## 2020-08-23 NOTE — Care Management Important Message (Signed)
Important Message  Patient Details  Name: [name] MRN: [id number] Date of Birth: December 10, 1940   Medicare Important Message Given:  Yes     [name] Calcium 08/23/2020, 1:22 PM

## 2020-08-23 NOTE — Progress Notes (Addendum)
PROGRESS NOTE  [name] AYT:[id number] DOB: 1940-07-27 DOA: 08/19/2020 PCP: Orpah Melter, MD   LOS: 4 days   Brief Narrative / Interim history: 80 year old with past medical history significant for hypertension, aortic aneurysm, anxiety with depression, GERD who was brought by son from home with significant weakness, dyspnea on exertion and urinary frequency. Patient report having excessive urination at night.  She has been feeling weak and unable to get out of bed easily. Evaluation in the ED patient was found to have a potassium of 2.6.  Patient was found to have elevation of troponin.  Subjective / 24h Interval events: Some chest discomfort, thinks it is related to mucous  Assessment & Plan: Principal Problem Hypokalemia, generalized weakness -Likely due to diuretics, they are now on hold.  Potassium normalized at 4.3  Active Problems Troponin elevation, dyspnea on exertion -Cardiology consulted, appreciate input.  No further work-up necessary  Mild memory problems -Patient son tells me he is suspecting some degree of early dementia as patient has been having worsening memory problems, he also is not sure whether she is taking the medications as scheduled at home, is increasingly concerned about her safety.  He is looking into independent living as an outpatient but for now needs SNF or CIR  Urinary frequency -No UTI on urinalysis, likely due to home diuretics.  This is a chronic issue for her and patient's son told me she has been wearing depends for a number of years  Hyperlipidemia -Continue statin   Scheduled Meds: . carvedilol  12.5 mg Oral BID WC  . enoxaparin (LOVENOX) injection  40 mg Subcutaneous Daily  . pantoprazole  40 mg Oral Daily  . rosuvastatin  10 mg Oral Daily   Continuous Infusions: PRN Meds:.acetaminophen **OR** acetaminophen, loratadine, ondansetron **OR** ondansetron (ZOFRAN) IV  Diet Orders (From admission, onward)    Start     Ordered    08/19/20 2351  Diet Heart Room service appropriate? Yes; Fluid consistency: Thin  Diet effective now       Question Answer Comment  Room service appropriate? Yes   Fluid consistency: Thin      08/19/20 2350          DVT prophylaxis: enoxaparin (LOVENOX) injection 40 mg Start: 08/20/20 1000     Code Status: Full Code  Family Communication: son at bedside   Status is: Inpatient  Remains inpatient appropriate because:Unsafe d/c plan   Dispo: The patient is from: Home              Anticipated d/c is to: CIR              Anticipated d/c date is: 2 days              Patient currently is not medically stable to d/c.   Difficult to place patient No  Level of care: Telemetry Cardiac  Consultants:  Cardiology   Procedures:  None   Microbiology  None   Antimicrobials: None     Objective: Vitals:   08/22/20 1117 08/22/20 1709 08/22/20 2327 08/23/20 0533  BP: (!) 145/80 (!) 149/77 (!) 160/101 (!) 176/101  Pulse: 90 88 81 86  Resp: 17 18 18 19   Temp: 98.4 F (36.9 C) 99.2 F (37.3 C) 98.4 F (36.9 C) 98.3 F (36.8 C)  TempSrc: Oral Oral Oral Oral  SpO2: 96% 93% 92% 95%  Weight:      Height:       No intake or output data in the 24 hours  ending 08/23/20 1055 Filed Weights   08/19/20 1934 08/20/20 0346  Weight: 80.7 kg 81.6 kg    Examination:  Constitutional: No distress Respiratory: Clear bilaterally Cardiovascular: Regular rate and rhythm   Data Reviewed: I have independently reviewed following labs and imaging studies   CBC: Recent Labs  Lab 08/19/20 1943 08/19/20 2022 08/20/20 0053 08/20/20 0352 08/22/20 0317 08/23/20 0028  WBC 11.5*  --  10.9* 8.8 6.3 6.6  NEUTROABS 8.6*  --   --   --   --   --   HGB 12.5 11.6* 11.8* 11.0* 10.0* 10.6*  HCT 39.4 34.0* 34.5* 34.1* 31.1* 33.4*  MCV 101.5*  --  99.1 99.7 100.6* 100.9*  PLT 265  --  265 230 226 379   Basic Metabolic Panel: Recent Labs  Lab 08/19/20 1943 08/19/20 2022 08/19/20 2230  08/20/20 0053 08/20/20 0352 08/21/20 1034 08/22/20 0317 08/23/20 0028  NA 141 142  --   --  139 140 141 140  K 2.6* 2.6*  --   --  4.0 3.7 3.4* 4.3  CL 101 101  --   --  104 105 105 102  CO2 26  --   --   --  27 27 27 30   GLUCOSE 117* 114*  --   --  109* 156* 98 106*  BUN 36* 37*  --   --  37* 24* 15 12  CREATININE 0.71 0.70  --  0.93 0.72 0.77 0.63 0.68  CALCIUM 9.6  --   --   --  9.1 9.1 8.7* 9.2  MG  --   --  2.0  --   --   --   --   --    Liver Function Tests: Recent Labs  Lab 08/19/20 1943 08/20/20 0352  AST 27 24  ALT 23 18  ALKPHOS 44 40  BILITOT 0.9 1.1  PROT 6.2* 5.6*  ALBUMIN 3.5 3.1*   Coagulation Profile: No results for input(s): INR, PROTIME in the last 168 hours. HbA1C: No results for input(s): HGBA1C in the last 72 hours. CBG: No results for input(s): GLUCAP in the last 168 hours.  Recent Results (from the past 240 hour(s))  Resp Panel by RT-PCR (Flu A&B, Covid) Nasopharyngeal Swab     Status: None   Collection Time: 08/20/20  1:06 AM   Specimen: Nasopharyngeal Swab; Nasopharyngeal(NP) swabs in vial transport medium  Result Value Ref Range Status   SARS Coronavirus 2 by RT PCR NEGATIVE NEGATIVE Final    Comment: (NOTE) SARS-CoV-2 target nucleic acids are NOT DETECTED.  The SARS-CoV-2 RNA is generally detectable in upper respiratory specimens during the acute phase of infection. The lowest concentration of SARS-CoV-2 viral copies this assay can detect is 138 copies/mL. A negative result does not preclude SARS-Cov-2 infection and should not be used as the sole basis for treatment or other patient management decisions. A negative result may occur with  improper specimen collection/handling, submission of specimen other than nasopharyngeal swab, presence of viral mutation(s) within the areas targeted by this assay, and inadequate number of viral copies(<138 copies/mL). A negative result must be combined with clinical observations, patient history, and  epidemiological information. The expected result is Negative.  Fact Sheet for Patients:  EntrepreneurPulse.com.au  Fact Sheet for Healthcare Providers:  IncredibleEmployment.be  This test is no t yet approved or cleared by the [place] FDA and  has been authorized for detection and/or diagnosis of SARS-CoV-2 by FDA under an Emergency Use Authorization (EUA). This EUA  will remain  in effect (meaning this test can be used) for the duration of the COVID-19 declaration under Section 564(b)(1) of the Act, 21 U.S.C.section 360bbb-3(b)(1), unless the authorization is terminated  or revoked sooner.       Influenza A by PCR NEGATIVE NEGATIVE Final   Influenza B by PCR NEGATIVE NEGATIVE Final    Comment: (NOTE) The Xpert Xpress SARS-CoV-2/FLU/RSV plus assay is intended as an aid in the diagnosis of influenza from Nasopharyngeal swab specimens and should not be used as a sole basis for treatment. Nasal washings and aspirates are unacceptable for Xpert Xpress SARS-CoV-2/FLU/RSV testing.  Fact Sheet for Patients: EntrepreneurPulse.com.au  Fact Sheet for Healthcare Providers: IncredibleEmployment.be  This test is not yet approved or cleared by the [place] FDA and has been authorized for detection and/or diagnosis of SARS-CoV-2 by FDA under an Emergency Use Authorization (EUA). This EUA will remain in effect (meaning this test can be used) for the duration of the COVID-19 declaration under Section 564(b)(1) of the Act, 21 U.S.C. section 360bbb-3(b)(1), unless the authorization is terminated or revoked.  Performed at [facility], [place] [address] [place], [place] [address]   Urine culture     Status: None   Collection Time: 08/20/20  3:08 PM   Specimen: Urine, Random  Result Value Ref Range Status   Specimen Description URINE, RANDOM  Final   Special Requests NONE  Final   Culture   Final    NO  GROWTH Performed at [facility], [place] [address], [place], [place] [address]    Report Status 08/21/2020 FINAL  Final     Radiology Studies: No results found.  Time spent: 35 minutes in 2 separate visits, > 50% bedside in counseling / discussions  Marzetta Board, MD, PhD [facility]  Between 7 am - 7 pm I am available, please contact me via Amion or Securechat  Between 7 pm - 7 am I am not available, please contact night coverage MD/APP via Amion

## 2020-08-23 NOTE — Progress Notes (Signed)
Physical Therapy Treatment Patient Details Name: [name] MRN: [id number] DOB: 06/18/1941 Today's Date: 08/23/2020    History of Present Illness 80yo female brought to the ED by her son due to significant weakness, DOE, and increased urinary frequency. EKG with L BBB but per cardio note this is chronic. Found to have severe  hypokalemia. PMH anxiety and depression, AAA, skin cancer, hx clavicle fracture, HTN, osteoporosis, L rotator cuff repair, THA    PT Comments    Pt received in bed, agreeable to participation in therapy. She required min assist bed mobility, min assist transfers, and min assist ambulation 25' with RW. LE exercises performed in recliner. Pt in recliner with feet elevated at end of session.    Follow Up Recommendations  CIR     Equipment Recommendations  Rolling walker with 5" wheels;3in1 (PT);Wheelchair cushion (measurements PT);Wheelchair (measurements PT)    Recommendations for Other Services       Precautions / Restrictions Precautions Precautions: Fall Restrictions Weight Bearing Restrictions: No    Mobility  Bed Mobility Overal bed mobility: Needs Assistance Bed Mobility: Supine to Sit     Supine to sit: Min assist;HOB elevated     General bed mobility comments: +rail, increased time    Transfers Overall transfer level: Needs assistance Equipment used: Rolling walker (2 wheeled) Transfers: Sit to/from Stand Sit to Stand: Min assist         General transfer comment: cues for hand placement, assist to power up and stabilize balance  Ambulation/Gait Ambulation/Gait assistance: Min assist Gait Distance (Feet): 25 Feet Assistive device: Rolling walker (2 wheeled) Gait Pattern/deviations: Decreased stride length;Step-to pattern;Step-through pattern Gait velocity: decreased Gait velocity interpretation: <1.8 ft/sec, indicate of risk for recurrent falls General Gait Details: assist to maintain balance, fatigues quickly   Stairs              Wheelchair Mobility    Modified Rankin (Stroke Patients Only)       Balance Overall balance assessment: Needs assistance;History of Falls Sitting-balance support: Feet supported;Bilateral upper extremity supported Sitting balance-Leahy Scale: Fair     Standing balance support: Bilateral upper extremity supported;During functional activity Standing balance-Leahy Scale: Poor Standing balance comment: reliant on external support                            Cognition Arousal/Alertness: Awake/alert Behavior During Therapy: Flat affect Overall Cognitive Status: No family/caregiver present to determine baseline cognitive functioning Area of Impairment: Orientation;Attention;Memory;Following commands;Safety/judgement;Awareness;Problem solving                 Orientation Level: Disoriented to;Time Current Attention Level: Selective Memory: Decreased short-term memory Following Commands: Follows one step commands consistently;Follows one step commands with increased time Safety/Judgement: Decreased awareness of safety;Decreased awareness of deficits Awareness: Emergent Problem Solving: Difficulty sequencing;Requires verbal cues;Requires tactile cues;Slow processing        Exercises General Exercises - Lower Extremity Ankle Circles/Pumps: AROM;Both;10 reps;Seated Long Arc Quad: AROM;Right;Left;10 reps;Seated Heel Slides: AROM;Right;Left;10 reps;Supine Hip ABduction/ADduction: AROM;Both;10 reps;Seated Hip Flexion/Marching: AROM;Right;Left;10 reps;Seated    General Comments        Pertinent Vitals/Pain Pain Assessment: Faces Pain Score: 0-No pain Faces Pain Scale: No hurt    Home Living                      Prior Function            PT Goals (current goals can now be found in the care  plan section) Acute Rehab PT Goals Patient Stated Goal: get stronger Progress towards PT goals: Progressing toward goals    Frequency    Min  3X/week      PT Plan Current plan remains appropriate    Co-evaluation              AM-PAC PT "6 Clicks" Mobility   Outcome Measure  Help needed turning from your back to your side while in a flat bed without using bedrails?: A Little Help needed moving from lying on your back to sitting on the side of a flat bed without using bedrails?: A Lot Help needed moving to and from a bed to a chair (including a wheelchair)?: A Lot Help needed standing up from a chair using your arms (e.g., wheelchair or bedside chair)?: A Little Help needed to walk in hospital room?: A Little Help needed climbing 3-5 steps with a railing? : A Lot 6 Click Score: 15    End of Session Equipment Utilized During Treatment: Gait belt Activity Tolerance: Patient tolerated treatment well Patient left: in chair;with call bell/phone within reach;with chair alarm set Nurse Communication: Mobility status PT Visit Diagnosis: Unsteadiness on feet (R26.81);Difficulty in walking, not elsewhere classified (R26.2);Muscle weakness (generalized) (M62.81);History of falling (Z91.81)     Time: 0211-1735 PT Time Calculation (min) (ACUTE ONLY): 24 min  Charges:  $Gait Training: 8-22 mins $Therapeutic Exercise: 8-22 mins                     Lorrin Goodell, PT  Office # [phone] Pager [phone]    [name] 08/23/2020, 9:40 AM

## 2020-08-23 NOTE — Progress Notes (Signed)
Inpatient Rehab Admissions Coordinator:   Met with patient and her son at bedside to explain criteria for CIR.  I explained that patient's who qualify for our program have a functional need for therapy, but also have a medical need to be in the hospital while getting that therapy.  I let them know that this was why I did not feel [employer] would approve pt for CIR.   Pt and son agree that she does not require this level of care and are agreeable.  They would like to see PT again over the weekend, if possible (though with staffing and hospital census this may not be possible), and/or on Monday to see how patient is mobilizing.  Pt's son has secured her a bed at an ILF, which has an option for increased supervision (15 minute check ins, home health therapy, etc), which may allow her to d/c home versus SNF.  I let him know I would pass along to therapy team so they can assess.  No need for CIR at this time.   Shann Medal, PT, DPT Admissions Coordinator [phone] 08/23/20  2:33 PM

## 2020-08-24 DIAGNOSIS — R778 Other specified abnormalities of plasma proteins: Secondary | ICD-10-CM | POA: Diagnosis not present

## 2020-08-24 DIAGNOSIS — E876 Hypokalemia: Secondary | ICD-10-CM | POA: Diagnosis not present

## 2020-08-24 DIAGNOSIS — I10 Essential (primary) hypertension: Secondary | ICD-10-CM | POA: Diagnosis not present

## 2020-08-24 DIAGNOSIS — R0600 Dyspnea, unspecified: Secondary | ICD-10-CM | POA: Diagnosis not present

## 2020-08-24 MED ORDER — AMLODIPINE BESYLATE 5 MG PO TABS
5.0000 mg | ORAL_TABLET | Freq: Every day | ORAL | Status: DC
  Administered 2020-08-24 – 2020-08-27 (×4): 5 mg via ORAL
  Filled 2020-08-24 (×4): qty 1

## 2020-08-24 NOTE — Progress Notes (Signed)
PROGRESS NOTE  [name] DUK:[id number] DOB: 26-Jul-1940 DOA: 08/19/2020 PCP: Orpah Melter, MD   LOS: 5 days   Brief Narrative / Interim history: 80 year old with past medical history significant for hypertension, aortic aneurysm, anxiety with depression, GERD who was brought by son from home with significant weakness, dyspnea on exertion and urinary frequency. Patient report having excessive urination at night.  She has been feeling weak and unable to get out of bed easily. Evaluation in the ED patient was found to have a potassium of 2.6.  Patient was found to have elevation of troponin.  Subjective / 24h Interval events: Complains of a headache this morning, tells me she gets it when the blood pressure is really high  Assessment & Plan: Principal Problem Hypokalemia, generalized weakness -Likely due to diuretics, they are now on hold.  Potassium is normal now  Active Problems Troponin elevation, dyspnea on exertion -Cardiology consulted, appreciate input.  No further work-up necessary  Mild memory problems -Patient son tells me he is suspecting some degree of early dementia as patient has been having worsening memory problems, he also is not sure whether she is taking the medications as scheduled at home, is increasingly concerned about her safety.   -Pending placement, she is unsafe to go back home, SNF versus independent living based on how she does with therapy over the weekend  Urinary frequency -No UTI on urinalysis, likely due to home diuretics.  This is a chronic issue for her and patient's son told me she has been wearing depends for a number of years  Hyperlipidemia -Continue statin  Essential hypertension -Home diuretics on hold due to hyperkalemia, continue home Coreg, add amlodipine today  Scheduled Meds: . amLODipine  5 mg Oral Daily  . carvedilol  12.5 mg Oral BID WC  . enoxaparin (LOVENOX) injection  40 mg Subcutaneous Daily  . pantoprazole  40 mg Oral  Daily  . rosuvastatin  10 mg Oral Daily   Continuous Infusions: PRN Meds:.acetaminophen **OR** acetaminophen, loratadine, ondansetron **OR** ondansetron (ZOFRAN) IV  Diet Orders (From admission, onward)    Start     Ordered   08/19/20 2351  Diet Heart Room service appropriate? Yes; Fluid consistency: Thin  Diet effective now       Question Answer Comment  Room service appropriate? Yes   Fluid consistency: Thin      08/19/20 2350          DVT prophylaxis: enoxaparin (LOVENOX) injection 40 mg Start: 08/20/20 1000     Code Status: Full Code  Family Communication: son at bedside   Status is: Inpatient  Remains inpatient appropriate because:Unsafe d/c plan   Dispo: The patient is from: Home              Anticipated d/c is to: SNF              Anticipated d/c date is: 2 days              Patient currently is not medically stable to d/c.   Difficult to place patient No  Level of care: Telemetry Cardiac  Consultants:  Cardiology   Procedures:  None   Microbiology  None   Antimicrobials: None     Objective: Vitals:   08/23/20 1657 08/23/20 2310 08/24/20 0500 08/24/20 0924  BP: (!) 163/74 (!) 158/84 (!) 158/87 (!) 142/78  Pulse: 79 71  73  Resp: 18 20 17    Temp: 98.5 F (36.9 C) 98.7 F (37.1 C) 98.4 F (36.9  C)   TempSrc: Oral Oral Oral   SpO2: 93% 93% 93%   Weight:      Height:        Intake/Output Summary (Last 24 hours) at 08/24/2020 1043 Last data filed at 08/23/2020 2240 Gross per 24 hour  Intake --  Output 800 ml  Net -800 ml   Filed Weights   08/19/20 1934 08/20/20 0346  Weight: 80.7 kg 81.6 kg    Examination:  Constitutional: NAD, calm, comfortable Eyes: PERRL, lids and conjunctivae normal ENMT: Mucous membranes are moist.  Neck: normal, supple Respiratory: clear to auscultation bilaterally, no wheezing, no crackles. Normal respiratory effort.  Cardiovascular: Regular rate and rhythm, no murmurs / rubs / gallops. No LE edema.   Abdomen: no tenderness. Bowel sounds positive.  Skin: no rashes, lesions, ulcers. No induration Neurologic: non focal   Data Reviewed: I have independently reviewed following labs and imaging studies   CBC: Recent Labs  Lab 08/19/20 1943 08/19/20 2022 08/20/20 0053 08/20/20 0352 08/22/20 0317 08/23/20 0028  WBC 11.5*  --  10.9* 8.8 6.3 6.6  NEUTROABS 8.6*  --   --   --   --   --   HGB 12.5 11.6* 11.8* 11.0* 10.0* 10.6*  HCT 39.4 34.0* 34.5* 34.1* 31.1* 33.4*  MCV 101.5*  --  99.1 99.7 100.6* 100.9*  PLT 265  --  265 230 226 468   Basic Metabolic Panel: Recent Labs  Lab 08/19/20 1943 08/19/20 2022 08/19/20 2230 08/20/20 0053 08/20/20 0352 08/21/20 1034 08/22/20 0317 08/23/20 0028  NA 141 142  --   --  139 140 141 140  K 2.6* 2.6*  --   --  4.0 3.7 3.4* 4.3  CL 101 101  --   --  104 105 105 102  CO2 26  --   --   --  27 27 27 30   GLUCOSE 117* 114*  --   --  109* 156* 98 106*  BUN 36* 37*  --   --  37* 24* 15 12  CREATININE 0.71 0.70  --  0.93 0.72 0.77 0.63 0.68  CALCIUM 9.6  --   --   --  9.1 9.1 8.7* 9.2  MG  --   --  2.0  --   --   --   --   --    Liver Function Tests: Recent Labs  Lab 08/19/20 1943 08/20/20 0352  AST 27 24  ALT 23 18  ALKPHOS 44 40  BILITOT 0.9 1.1  PROT 6.2* 5.6*  ALBUMIN 3.5 3.1*   Coagulation Profile: No results for input(s): INR, PROTIME in the last 168 hours. HbA1C: No results for input(s): HGBA1C in the last 72 hours. CBG: No results for input(s): GLUCAP in the last 168 hours.  Recent Results (from the past 240 hour(s))  Resp Panel by RT-PCR (Flu A&B, Covid) Nasopharyngeal Swab     Status: None   Collection Time: 08/20/20  1:06 AM   Specimen: Nasopharyngeal Swab; Nasopharyngeal(NP) swabs in vial transport medium  Result Value Ref Range Status   SARS Coronavirus 2 by RT PCR NEGATIVE NEGATIVE Final    Comment: (NOTE) SARS-CoV-2 target nucleic acids are NOT DETECTED.  The SARS-CoV-2 RNA is generally detectable in upper  respiratory specimens during the acute phase of infection. The lowest concentration of SARS-CoV-2 viral copies this assay can detect is 138 copies/mL. A negative result does not preclude SARS-Cov-2 infection and should not be used as the sole basis for treatment  or other patient management decisions. A negative result may occur with  improper specimen collection/handling, submission of specimen other than nasopharyngeal swab, presence of viral mutation(s) within the areas targeted by this assay, and inadequate number of viral copies(<138 copies/mL). A negative result must be combined with clinical observations, patient history, and epidemiological information. The expected result is Negative.  Fact Sheet for Patients:  EntrepreneurPulse.com.au  Fact Sheet for Healthcare Providers:  IncredibleEmployment.be  This test is no t yet approved or cleared by the [place] FDA and  has been authorized for detection and/or diagnosis of SARS-CoV-2 by FDA under an Emergency Use Authorization (EUA). This EUA will remain  in effect (meaning this test can be used) for the duration of the COVID-19 declaration under Section 564(b)(1) of the Act, 21 U.S.C.section 360bbb-3(b)(1), unless the authorization is terminated  or revoked sooner.       Influenza A by PCR NEGATIVE NEGATIVE Final   Influenza B by PCR NEGATIVE NEGATIVE Final    Comment: (NOTE) The Xpert Xpress SARS-CoV-2/FLU/RSV plus assay is intended as an aid in the diagnosis of influenza from Nasopharyngeal swab specimens and should not be used as a sole basis for treatment. Nasal washings and aspirates are unacceptable for Xpert Xpress SARS-CoV-2/FLU/RSV testing.  Fact Sheet for Patients: EntrepreneurPulse.com.au  Fact Sheet for Healthcare Providers: IncredibleEmployment.be  This test is not yet approved or cleared by the [place] FDA and has been  authorized for detection and/or diagnosis of SARS-CoV-2 by FDA under an Emergency Use Authorization (EUA). This EUA will remain in effect (meaning this test can be used) for the duration of the COVID-19 declaration under Section 564(b)(1) of the Act, 21 U.S.C. section 360bbb-3(b)(1), unless the authorization is terminated or revoked.  Performed at [facility], [place] [address], [place], [place] [address]   Urine culture     Status: None   Collection Time: 08/20/20  3:08 PM   Specimen: Urine, Random  Result Value Ref Range Status   Specimen Description URINE, RANDOM  Final   Special Requests NONE  Final   Culture   Final    NO GROWTH Performed at [facility], [place] [address], [place], [place] [address]    Report Status 08/21/2020 FINAL  Final     Radiology Studies: No results found.  Time spent: 35 minutes in 2 separate visits, > 50% bedside in counseling / discussions  Marzetta Board, MD, PhD [facility]  Between 7 am - 7 pm I am available, please contact me via Amion or Securechat  Between 7 pm - 7 am I am not available, please contact night coverage MD/APP via Amion

## 2020-08-25 DIAGNOSIS — E876 Hypokalemia: Secondary | ICD-10-CM | POA: Diagnosis not present

## 2020-08-25 LAB — BASIC METABOLIC PANEL
Anion gap: 10 (ref 5–15)
BUN: 13 mg/dL (ref 8–23)
CO2: 31 mmol/L (ref 22–32)
Calcium: 9.3 mg/dL (ref 8.9–10.3)
Chloride: 98 mmol/L (ref 98–111)
Creatinine, Ser: 0.68 mg/dL (ref 0.44–1.00)
GFR, Estimated: >60 mL/min (ref >60)
Glucose, Bld: 101 mg/dL — ABNORMAL HIGH (ref 70–99)
Potassium: 3.8 mmol/L (ref 3.5–5.1)
Sodium: 139 mmol/L (ref 135–145)

## 2020-08-25 MED ORDER — POTASSIUM CHLORIDE CRYS ER 20 MEQ PO TBCR
20.0000 meq | EXTENDED_RELEASE_TABLET | Freq: Once | ORAL | Status: AC
  Administered 2020-08-25: 20 meq via ORAL
  Filled 2020-08-25: qty 1

## 2020-08-25 NOTE — Progress Notes (Signed)
PROGRESS NOTE  [name] CHE:[id number] DOB: 11/10/1940 DOA: 08/19/2020 PCP: Orpah Melter, MD   LOS: 6 days   Brief Narrative / Interim history: 80 year old with past medical history significant for hypertension, aortic aneurysm, anxiety with depression, GERD who was brought by son from home with significant weakness, dyspnea on exertion and urinary frequency. Patient report having excessive urination at night.  She has been feeling weak and unable to get out of bed easily. Evaluation in the ED patient was found to have a potassium of 2.6.  Patient was found to have elevation of troponin.  Subjective / 24h Interval events: No complaints  Assessment & Plan: Principal Problem Hypokalemia, generalized weakness -Likely due to diuretics, they are now on hold.  K remains normal  Active Problems Troponin elevation, dyspnea on exertion -Cardiology consulted, appreciate input.  No further work-up necessary  Mild memory problems -Patient son tells me he is suspecting some degree of early dementia as patient has been having worsening memory problems, he also is not sure whether she is taking the medications as scheduled at home, is increasingly concerned about her safety.   -Pending placement, she is unsafe to go back home, SNF versus independent living based on how she does with therapy over the weekend  Urinary frequency -No UTI on urinalysis, likely due to home diuretics.  This is a chronic issue for her and patient's son told me she has been wearing depends for a number of years  Hyperlipidemia -Continue statin  Essential hypertension -Home diuretics on hold due to hyperkalemia, continue home Coreg, add amlodipine today  Scheduled Meds: . amLODipine  5 mg Oral Daily  . carvedilol  12.5 mg Oral BID WC  . enoxaparin (LOVENOX) injection  40 mg Subcutaneous Daily  . pantoprazole  40 mg Oral Daily  . rosuvastatin  10 mg Oral Daily   Continuous Infusions: PRN Meds:.acetaminophen  **OR** acetaminophen, loratadine, ondansetron **OR** ondansetron (ZOFRAN) IV  Diet Orders (From admission, onward)    Start     Ordered   08/19/20 2351  Diet Heart Room service appropriate? Yes; Fluid consistency: Thin  Diet effective now       Question Answer Comment  Room service appropriate? Yes   Fluid consistency: Thin      08/19/20 2350          DVT prophylaxis: enoxaparin (LOVENOX) injection 40 mg Start: 08/20/20 1000     Code Status: Full Code  Family Communication: son at bedside   Status is: Inpatient  Remains inpatient appropriate because:Unsafe d/c plan   Dispo: The patient is from: Home              Anticipated d/c is to: SNF              Anticipated d/c date is: 2 days              Patient currently is not medically stable to d/c.   Difficult to place patient No  Level of care: Telemetry Cardiac  Consultants:  Cardiology   Procedures:  None   Microbiology  None   Antimicrobials: None     Objective: Vitals:   08/24/20 1210 08/24/20 1734 08/25/20 0031 08/25/20 0503  BP: (!) 110/57 117/74 (!) 142/71 (!) 142/76  Pulse: 77 76 70 71  Resp: 16 18 18 18   Temp: 98.3 F (36.8 C) 98.4 F (36.9 C) 98 F (36.7 C) 98 F (36.7 C)  TempSrc: Oral Oral Oral Oral  SpO2: 94% 94% 95% 95%  Weight:      Height:        Intake/Output Summary (Last 24 hours) at 08/25/2020 0838 Last data filed at 08/25/2020 0505 Gross per 24 hour  Intake 240 ml  Output 1000 ml  Net -760 ml   Filed Weights   08/19/20 1934 08/20/20 0346  Weight: 80.7 kg 81.6 kg    Examination:  Constitutional: NAD Respiratory: CTA Cardiovascular: RRR  Data Reviewed: I have independently reviewed following labs and imaging studies   CBC: Recent Labs  Lab 08/19/20 1943 08/19/20 2022 08/20/20 0053 08/20/20 0352 08/22/20 0317 08/23/20 0028  WBC 11.5*  --  10.9* 8.8 6.3 6.6  NEUTROABS 8.6*  --   --   --   --   --   HGB 12.5 11.6* 11.8* 11.0* 10.0* 10.6*  HCT 39.4 34.0* 34.5*  34.1* 31.1* 33.4*  MCV 101.5*  --  99.1 99.7 100.6* 100.9*  PLT 265  --  265 230 226 176   Basic Metabolic Panel: Recent Labs  Lab 08/19/20 2230 08/20/20 0053 08/20/20 0352 08/21/20 1034 08/22/20 0317 08/23/20 0028 08/25/20 0235  NA  --   --  139 140 141 140 139  K  --   --  4.0 3.7 3.4* 4.3 3.8  CL  --   --  104 105 105 102 98  CO2  --   --  27 27 27 30 31   GLUCOSE  --   --  109* 156* 98 106* 101*  BUN  --   --  37* 24* 15 12 13   CREATININE  --    < > 0.72 0.77 0.63 0.68 0.68  CALCIUM  --   --  9.1 9.1 8.7* 9.2 9.3  MG 2.0  --   --   --   --   --   --    < > = values in this interval not displayed.   Liver Function Tests: Recent Labs  Lab 08/19/20 1943 08/20/20 0352  AST 27 24  ALT 23 18  ALKPHOS 44 40  BILITOT 0.9 1.1  PROT 6.2* 5.6*  ALBUMIN 3.5 3.1*   Coagulation Profile: No results for input(s): INR, PROTIME in the last 168 hours. HbA1C: No results for input(s): HGBA1C in the last 72 hours. CBG: No results for input(s): GLUCAP in the last 168 hours.  Recent Results (from the past 240 hour(s))  Resp Panel by RT-PCR (Flu A&B, Covid) Nasopharyngeal Swab     Status: None   Collection Time: 08/20/20  1:06 AM   Specimen: Nasopharyngeal Swab; Nasopharyngeal(NP) swabs in vial transport medium  Result Value Ref Range Status   SARS Coronavirus 2 by RT PCR NEGATIVE NEGATIVE Final    Comment: (NOTE) SARS-CoV-2 target nucleic acids are NOT DETECTED.  The SARS-CoV-2 RNA is generally detectable in upper respiratory specimens during the acute phase of infection. The lowest concentration of SARS-CoV-2 viral copies this assay can detect is 138 copies/mL. A negative result does not preclude SARS-Cov-2 infection and should not be used as the sole basis for treatment or other patient management decisions. A negative result may occur with  improper specimen collection/handling, submission of specimen other than nasopharyngeal swab, presence of viral mutation(s) within  the areas targeted by this assay, and inadequate number of viral copies(<138 copies/mL). A negative result must be combined with clinical observations, patient history, and epidemiological information. The expected result is Negative.  Fact Sheet for Patients:  EntrepreneurPulse.com.au  Fact Sheet for Healthcare Providers:  IncredibleEmployment.be  This  test is no t yet approved or cleared by the [place] and  has been authorized for detection and/or diagnosis of SARS-CoV-2 by FDA under an Emergency Use Authorization (EUA). This EUA will remain  in effect (meaning this test can be used) for the duration of the COVID-19 declaration under Section 564(b)(1) of the Act, 21 U.S.C.section 360bbb-3(b)(1), unless the authorization is terminated  or revoked sooner.       Influenza A by PCR NEGATIVE NEGATIVE Final   Influenza B by PCR NEGATIVE NEGATIVE Final    Comment: (NOTE) The Xpert Xpress SARS-CoV-2/FLU/RSV plus assay is intended as an aid in the diagnosis of influenza from Nasopharyngeal swab specimens and should not be used as a sole basis for treatment. Nasal washings and aspirates are unacceptable for Xpert Xpress SARS-CoV-2/FLU/RSV testing.  Fact Sheet for Patients: EntrepreneurPulse.com.au  Fact Sheet for Healthcare Providers: IncredibleEmployment.be  This test is not yet approved or cleared by the [place] FDA and has been authorized for detection and/or diagnosis of SARS-CoV-2 by FDA under an Emergency Use Authorization (EUA). This EUA will remain in effect (meaning this test can be used) for the duration of the COVID-19 declaration under Section 564(b)(1) of the Act, 21 U.S.C. section 360bbb-3(b)(1), unless the authorization is terminated or revoked.  Performed at [facility], [place] [address], [place], [place] [address]   Urine culture     Status: None   Collection Time:  08/20/20  3:08 PM   Specimen: Urine, Random  Result Value Ref Range Status   Specimen Description URINE, RANDOM  Final   Special Requests NONE  Final   Culture   Final    NO GROWTH Performed at [facility], [place] [address] [place], [place] [address]    Report Status 08/21/2020 FINAL  Final     Radiology Studies: No results found.  Time spent: 35 minutes in 2 separate visits, > 50% bedside in counseling / discussions  Marzetta Board, MD, PhD [facility]  Between 7 am - 7 pm I am available, please contact me via Amion or Securechat  Between 7 pm - 7 am I am not available, please contact night coverage MD/APP via Amion

## 2020-08-26 DIAGNOSIS — E876 Hypokalemia: Secondary | ICD-10-CM | POA: Diagnosis not present

## 2020-08-26 DIAGNOSIS — R778 Other specified abnormalities of plasma proteins: Secondary | ICD-10-CM | POA: Diagnosis not present

## 2020-08-26 DIAGNOSIS — R0600 Dyspnea, unspecified: Secondary | ICD-10-CM | POA: Diagnosis not present

## 2020-08-26 DIAGNOSIS — I10 Essential (primary) hypertension: Secondary | ICD-10-CM | POA: Diagnosis not present

## 2020-08-26 LAB — BASIC METABOLIC PANEL
Anion gap: 9 (ref 5–15)
BUN: 16 mg/dL (ref 8–23)
CO2: 29 mmol/L (ref 22–32)
Calcium: 9.3 mg/dL (ref 8.9–10.3)
Chloride: 101 mmol/L (ref 98–111)
Creatinine, Ser: 0.77 mg/dL (ref 0.44–1.00)
GFR, Estimated: >60 mL/min (ref >60)
Glucose, Bld: 100 mg/dL — ABNORMAL HIGH (ref 70–99)
Potassium: 3.7 mmol/L (ref 3.5–5.1)
Sodium: 139 mmol/L (ref 135–145)

## 2020-08-26 MED ORDER — POTASSIUM CHLORIDE CRYS ER 20 MEQ PO TBCR
40.0000 meq | EXTENDED_RELEASE_TABLET | Freq: Once | ORAL | Status: AC
  Administered 2020-08-26: 40 meq via ORAL
  Filled 2020-08-26: qty 2

## 2020-08-26 NOTE — Progress Notes (Signed)
Physical Therapy Treatment Patient Details Name: [name] MRN: [id number] DOB: 03/28/1941 Today's Date: 08/26/2020    History of Present Illness 80yo female brought to the ED by her son due to significant weakness, DOE, and increased urinary frequency. EKG with L BBB but per cardio note this is chronic. Found to have severe  hypokalemia. PMH anxiety and depression, AAA, skin cancer, hx clavicle fracture, HTN, osteoporosis, L rotator cuff repair, THA    PT Comments    Pt progressing towards physical therapy goals. Noted family has arranged for an independent living facility with increased care available initially. Feel pt will be able to manage well in that environment, but will require that more frequent check in by staff due to cognition. Will continue to follow.     Follow Up Recommendations  Home health PT;Supervision for mobility/OOB (ILF with increased care packages initially)     Equipment Recommendations  Rolling walker with 5" wheels;3in1 (PT)    Recommendations for Other Services       Precautions / Restrictions Precautions Precautions: Fall Precaution Comments: R shoulder pain/weakness Restrictions Weight Bearing Restrictions: No    Mobility  Bed Mobility Overal bed mobility: Needs Assistance Bed Mobility: Supine to Sit     Supine to sit: Mod assist;HOB elevated     General bed mobility comments: Initially pt attempting to scoot LE's off EOB and sit up. Pt appeared stuck and was not able to sequence where to put hands or how to transition to sitting. Therapist suggested log rolling for ease of transfer, and pt with increased difficulty attempting to sequence the roll. Mod assist provided for partial roll (pt pushing against therapist) and to elevate trunk to full sitting position.    Transfers Overall transfer level: Needs assistance Equipment used: Rolling walker (2 wheeled) Transfers: Sit to/from Stand Sit to Stand: Min guard         General  transfer comment: VC's for hand placement on seated surface for safety.  Ambulation/Gait Ambulation/Gait assistance: Min guard Gait Distance (Feet): 150 Feet Assistive device: Rolling walker (2 wheeled) Gait Pattern/deviations: Decreased stride length;Step-to pattern;Step-through pattern Gait velocity: decreased Gait velocity interpretation: <1.31 ft/sec, indicative of household ambulator General Gait Details: Pt fatigues quickly with short standing rest breaks required. Heavy reliance on RW but able to manage walker well overall.   Stairs             Wheelchair Mobility    Modified Rankin (Stroke Patients Only)       Balance Overall balance assessment: Needs assistance;History of Falls Sitting-balance support: Feet supported;Bilateral upper extremity supported Sitting balance-Leahy Scale: Fair Sitting balance - Comments: able to maintain midline statically, did not challenge dynamically   Standing balance support: Bilateral upper extremity supported;During functional activity Standing balance-Leahy Scale: Poor Standing balance comment: reliant on external support                            Cognition Arousal/Alertness: Awake/alert Behavior During Therapy: Flat affect Overall Cognitive Status: No family/caregiver present to determine baseline cognitive functioning Area of Impairment: Attention;Memory;Following commands;Safety/judgement;Awareness;Problem solving                 Orientation Level: Disoriented to;Time Current Attention Level: Selective Memory: Decreased short-term memory Following Commands: Follows one step commands consistently;Follows one step commands with increased time Safety/Judgement: Decreased awareness of safety;Decreased awareness of deficits Awareness: Intellectual Problem Solving: Difficulty sequencing;Requires verbal cues;Slow processing;Decreased initiation General Comments: Pt with difficulty sequencing log roll  to exit  bed and appeared stuck several times when trying to figure out how to sit up. Required multimodal cues to complete transfers.      Exercises      General Comments General comments (skin integrity, edema, etc.): VSS on RA      Pertinent Vitals/Pain Pain Assessment: Faces Faces Pain Scale: Hurts little more Pain Location: R shoulder Pain Descriptors / Indicators: Discomfort;Grimacing;Guarding Pain Intervention(s): Limited activity within patient's tolerance;Monitored during session;Repositioned    Home Living                      Prior Function            PT Goals (current goals can now be found in the care plan section) Acute Rehab PT Goals Patient Stated Goal: get stronger PT Goal Formulation: With patient Time For Goal Achievement: 09/04/20 Potential to Achieve Goals: Good Progress towards PT goals: Progressing toward goals    Frequency    Min 3X/week      PT Plan Current plan remains appropriate    Co-evaluation              AM-PAC PT "6 Clicks" Mobility   Outcome Measure  Help needed turning from your back to your side while in a flat bed without using bedrails?: A Little Help needed moving from lying on your back to sitting on the side of a flat bed without using bedrails?: A Lot Help needed moving to and from a bed to a chair (including a wheelchair)?: A Lot Help needed standing up from a chair using your arms (e.g., wheelchair or bedside chair)?: A Little Help needed to walk in hospital room?: A Little Help needed climbing 3-5 steps with a railing? : A Lot 6 Click Score: 15    End of Session Equipment Utilized During Treatment: Gait belt Activity Tolerance: Patient tolerated treatment well Patient left: in chair;with call bell/phone within reach;with chair alarm set Nurse Communication: Mobility status PT Visit Diagnosis: Unsteadiness on feet (R26.81);Difficulty in walking, not elsewhere classified (R26.2);Muscle weakness (generalized)  (M62.81);History of falling (Z91.81)     Time: 8657-8469 PT Time Calculation (min) (ACUTE ONLY): 19 min  Charges:  $Gait Training: 8-22 mins                     Rolinda Roan, PT, DPT Acute Rehabilitation Services Pager: [phone] Office: [phone]    Thelma Comp 08/26/2020, 11:54 AM

## 2020-08-26 NOTE — Progress Notes (Signed)
Occupational Therapy Treatment Patient Details Name: [name] MRN: [id number] DOB: 1940-11-07 Today's Date: 08/26/2020    History of present illness 80yo female brought to the ED by her son due to significant weakness, DOE, and increased urinary frequency. EKG with L BBB but per cardio note this is chronic. Found to have severe  hypokalemia. PMH anxiety and depression, AAA, skin cancer, hx clavicle fracture, HTN, osteoporosis, L rotator cuff repair, THA   OT comments  Pt progressing towards OT goals, able to demo transfers and mobility to/from bathroom at min guard using RW. Pt noted with slower processing, difficulty sequencing basic ADLs and cues needed for problem solving (see cognition section for more info). Pt overall Min A for toileting task with assist needed for clothing mgmt (with cues for sequencing). Noted in chart, pt's son has arranged ILF+ placement. If 24/7 supervision able to be provided and light physical assist as needed from staff, pt appropriate for DC to this location. Pt will also need assistance with IADLs and medication mgmt. If this is unable to be provided with arrangement, continue to recommend SNF for short term rehab.    Follow Up Recommendations  SNF;Other (comment) (Hope to progress to ILF+ arrangement with [facility] therapy follow-up at facility per notes)    Equipment Recommendations  3 in 1 bedside commode;Other (comment) (RW if pt does not already have)    Recommendations for Other Services      Precautions / Restrictions Precautions Precautions: Fall Precaution Comments: R shoulder pain Restrictions Weight Bearing Restrictions: No       Mobility Bed Mobility               General bed mobility comments: up in chair on entry  Transfers Overall transfer level: Needs assistance Equipment used: Rolling walker (2 wheeled) Transfers: Sit to/from Stand Sit to Stand: Min guard         General transfer comment: min guard for sit to stand from  recliner, pushing from armrests appropriately. Able to mobilize with RW to/from bathroom    Balance Overall balance assessment: Needs assistance;History of Falls Sitting-balance support: Feet supported;Bilateral upper extremity supported Sitting balance-Leahy Scale: Fair     Standing balance support: Bilateral upper extremity supported;During functional activity Standing balance-Leahy Scale: Poor Standing balance comment: reliant on external support                           ADL either performed or assessed with clinical judgement   ADL Overall ADL's : Needs assistance/impaired Eating/Feeding: Set up;Sitting                       Toilet Transfer: Min guard;Ambulation;RW;Comfort height toilet Toilet Transfer Details (indicate cue type and reason): min guard, slow pace with RW. Placed BSC over toilet Toileting- Clothing Manipulation and Hygiene: Minimal assistance;Sitting/lateral lean;Sit to/from stand Toileting - Clothing Manipulation Details (indicate cue type and reason): Min A for clothing mgmt, cues needed for sequencing       General ADL Comments: Pt able to progress to  min guard for transfers/mobility with RW, still with difficulty sequencing and requires cues throughout for effective problem solving     Vision   Vision Assessment?: No apparent visual deficits   Perception     Praxis      Cognition Arousal/Alertness: Awake/alert Behavior During Therapy: Flat affect Overall Cognitive Status: No family/caregiver present to determine baseline cognitive functioning Area of Impairment: Orientation;Attention;Memory;Following commands;Safety/judgement;Awareness;Problem solving  Orientation Level: Disoriented to;Time Current Attention Level: Selective Memory: Decreased short-term memory Following Commands: Follows one step commands consistently;Follows one step commands with increased time Safety/Judgement: Decreased awareness of  safety;Decreased awareness of deficits Awareness: Intellectual Problem Solving: Difficulty sequencing;Requires verbal cues;Requires tactile cues;Slow processing;Decreased initiation General Comments: pt with slower processing and difficulty problem solving, difficulty sequencing basic ADLs. On entry, pt reports pain in B LE and OT asked if pt had had medicine this AM. Pt reports unsure if she had pain medicine, inquiring if she used call bell to ask for tylenol. Pt reports she did not use call bell, cues needed to press button and when secretary asked "can I help you?", pt with difficulty reporting need...reported "I need help" though did not specify why, need for tylenol, etc. Once pt at toilet, pause noted with cues needed to prompt pt to pull down underwear        Exercises     Shoulder Instructions       General Comments VSS on RA    Pertinent Vitals/ Pain       Pain Assessment: Faces Faces Pain Scale: Hurts little more Pain Location: B LE Pain Descriptors / Indicators: Pins and needles;Numbness Pain Intervention(s): Monitored during session;Patient requesting pain meds-RN notified;Repositioned  Home Living                                          Prior Functioning/Environment              Frequency  Min 2X/week        Progress Toward Goals  OT Goals(current goals can now be found in the care plan section)  Progress towards OT goals: Progressing toward goals  Acute Rehab OT Goals Patient Stated Goal: get stronger OT Goal Formulation: With patient Time For Goal Achievement: 09/05/20 Potential to Achieve Goals: Good ADL Goals Pt Will Perform Grooming: with modified independence;standing Pt Will Perform Lower Body Bathing: with modified independence;sit to/from stand Pt Will Perform Lower Body Dressing: with modified independence;sit to/from stand Pt Will Transfer to Toilet: with modified independence;ambulating Pt Will Perform Toileting -  Clothing Manipulation and hygiene: with modified independence;sit to/from stand;sitting/lateral leans Pt Will Perform Tub/Shower Transfer: rolling walker;with supervision;ambulating;shower seat  Plan Discharge plan remains appropriate    Co-evaluation                 AM-PAC OT "6 Clicks" Daily Activity     Outcome Measure   Help from another person eating meals?: None Help from another person taking care of personal grooming?: None Help from another person toileting, which includes using toliet, bedpan, or urinal?: A Little Help from another person bathing (including washing, rinsing, drying)?: A Lot Help from another person to put on and taking off regular upper body clothing?: A Little Help from another person to put on and taking off regular lower body clothing?: A Lot 6 Click Score: 18    End of Session Equipment Utilized During Treatment: Rolling walker  OT Visit Diagnosis: Unsteadiness on feet (R26.81);Muscle weakness (generalized) (M62.81);Pain;Other symptoms and signs involving cognitive function   Activity Tolerance Patient tolerated treatment well   Patient Left in chair;with call bell/phone within reach;with chair alarm set   Nurse Communication Mobility status;Patient requests pain meds        Time: 0539-7673 OT Time Calculation (min): 19 min  Charges: OT General Charges $OT Visit: 1 Visit  OT Treatments $Self Care/Home Management : 8-22 mins  Malachy Chamber, OTR/L Acute Rehab Services Office: [phone]   Layla Maw 08/26/2020, 11:42 AM

## 2020-08-26 NOTE — TOC Initial Note (Addendum)
Transition of Care [facility]) - Initial/Assessment Note    Patient Details  Name: [name] MRN: [id number] Date of Birth: [birth year]  Transition of Care [facility]) CM/SW Contact:    Marilu Favre, RN Phone Number: 08/26/2020, 3:38 PM  Clinical Narrative:                  Spoke to patient at bedside and son Mitzi Hansen via phone. Mitzi Hansen has arranged for his mother to go to [facility] at discharge.  Patient has walker at home, however per Mitzi Hansen it was his grandmothers and he would like a new one. Walker and 3 in1 ordered through [employer].   Called [facility] [phone] spoke to [place], and confirmed plan. Melissa requesting NCM fax orders and face to face for HHPT/OT to her at [phone] and she will arrange through [facility] in their building. Clinicals and orders faxed.  Expected Discharge Plan: [place] Barriers to Discharge: Continued Medical Work up   Patient Goals and CMS Choice Patient states their goals for this hospitalization and ongoing recovery are:: [place] CMS Medicare.gov Compare Post Acute Care list provided to:: Patient Choice offered to / list presented to : [place]  Expected Discharge Plan and Services Expected Discharge Plan: [place]   Discharge Planning Services: CM Consult Post Acute Care Choice: Home Health,Durable Medical Equipment Living arrangements for the past 2 months: Single Family Home                 DME Arranged: 3-N-1,Walker rolling DME Agency: [employer] Date DME Agency Contacted: 08/26/20 Time DME Agency Contacted: [id number] Representative spoke with at DME Agency: Freda Munro HH Arranged: PT,OT          Prior Living Arrangements/Services Living arrangements for the past 2 months: Single Family Home Lives with:: Self Patient language and need for interpreter reviewed:: Yes Do you feel safe going back to the place where you live?: Yes       Need for Family Participation in Patient Care: Yes (Comment) Care giver support system in place?: Yes (comment)   Criminal Activity/Legal Involvement Pertinent to Current Situation/Hospitalization: No - Comment as needed  Activities of Daily Living Home Assistive Devices/Equipment: Eyeglasses ADL Screening (condition at time of admission) Patient's cognitive ability adequate to safely complete daily activities?: Yes Is the patient deaf or have difficulty hearing?: No Does the patient have difficulty seeing, even when wearing glasses/contacts?: No Does the patient have difficulty concentrating, remembering, or making decisions?: No Patient able to express need for assistance with ADLs?: No Does the patient have difficulty dressing or bathing?: No Independently performs ADLs?: Yes (appropriate for developmental age) Does the patient have difficulty walking or climbing stairs?: No Weakness of Legs: None Weakness of Arms/Hands: None  Permission Sought/Granted Permission sought to share information with : Facility Sport and exercise psychologist Permission granted to share information with : Yes, Verbal Permission Granted  Share Information with NAME: Arlis Everly [phone]  Permission granted to share info w AGENCY: [place] granted to share info w Relationship: son     Emotional Assessment Appearance:: Appears stated age Attitude/Demeanor/Rapport: Engaged Affect (typically observed): Accepting Orientation: : Oriented to Self,Oriented to Place,Oriented to  Time,Oriented to Situation Alcohol / Substance Use: Not Applicable Psych Involvement: No (comment)  Admission diagnosis:  Hypokalemia [E87.6] Elevated troponin [R77.8] Generalized weakness [R53.1] Patient Active Problem List   Diagnosis Date Noted  . GERD (gastroesophageal reflux  disease) 08/19/2020  . Hypokalemia 08/19/2020  . Urinary frequency 08/19/2020  . Weakness 08/19/2020  . Dyspnea 08/19/2020  .  Troponin I above reference range 08/19/2020  . Skin cancer 06/09/2017  . Essential hypertension 08/26/2015   PCP:  Orpah Melter, MD Pharmacy:   [id number] [place], [address] [place] [address] Phone: [phone] Fax: [phone]     Social Determinants of Health (SDOH) Interventions    Readmission Risk Interventions No flowsheet data found.

## 2020-08-26 NOTE — Progress Notes (Signed)
PROGRESS NOTE  Jolyne Laye ZHG:[id number] DOB: 08-06-1940 DOA: 08/19/2020 PCP: Orpah Melter, MD   LOS: 7 days   Brief Narrative / Interim history: 80 year old with past medical history significant for hypertension, aortic aneurysm, anxiety with depression, GERD who was brought by son from home with significant weakness, dyspnea on exertion and urinary frequency. Patient report having excessive urination at night.  She has been feeling weak and unable to get out of bed easily. Evaluation in the ED patient was found to have a potassium of 2.6.  Patient was found to have elevation of troponin.  Subjective / 24h Interval events: A bit tired this morning, no chest pain, no abdominal pain, no nausea/vomiting  Assessment & Plan: Principal Problem Hypokalemia, generalized weakness -Likely due to diuretics, they are now on hold. K normal today   Active Problems Troponin elevation, dyspnea on exertion -Cardiology consulted, appreciate input.  No further work-up necessary  Mild memory problems / disposition -Patient son tells me he is suspecting some degree of early dementia as patient has been having worsening memory problems, he also is not sure whether she is taking the medications as scheduled at home, is increasingly concerned about her safety.   -Pending placement, she is unsafe to go back home, she has an independent living arrangement for tomorrow, furniture is being moved in today, d/w/ son. PT worked with her and would like to evaluate / have another session tomorrow to ensure ILF discharge safety   Urinary frequency -No UTI on urinalysis, likely due to home diuretics.  This is a chronic issue for her and patient's son told me she has been wearing depends for a number of years. Recommend Kegel excercises   Hyperlipidemia -Continue statin  Essential hypertension -Home diuretics on hold due to hyperkalemia, continue home Coreg, amlodipine. BP normal this morning.   Scheduled  Meds: . amLODipine  5 mg Oral Daily  . carvedilol  12.5 mg Oral BID WC  . enoxaparin (LOVENOX) injection  40 mg Subcutaneous Daily  . pantoprazole  40 mg Oral Daily  . rosuvastatin  10 mg Oral Daily   Continuous Infusions: PRN Meds:.acetaminophen **OR** acetaminophen, loratadine, ondansetron **OR** ondansetron (ZOFRAN) IV  Diet Orders (From admission, onward)    Start     Ordered   08/19/20 2351  Diet Heart Room service appropriate? Yes; Fluid consistency: Thin  Diet effective now       Question Answer Comment  Room service appropriate? Yes   Fluid consistency: Thin      08/19/20 2350          DVT prophylaxis: enoxaparin (LOVENOX) injection 40 mg Start: 08/20/20 1000     Code Status: Full Code  Family Communication: son over the phone    Status is: Inpatient  Remains inpatient appropriate because:Unsafe d/c plan   Dispo: The patient is from: Home              Anticipated d/c is to: ALF              Anticipated d/c date is: 1 day              Patient currently is not medically stable to d/c.   Difficult to place patient No  Level of care: Telemetry Cardiac  Consultants:  Cardiology   Procedures:  None   Microbiology  None   Antimicrobials: None     Objective: Vitals:   08/25/20 1821 08/26/20 0059 08/26/20 0459 08/26/20 0953  BP: 136/77 (!) 154/80 135/80 124/82  Pulse: 79 70 74 78  Resp: 16 18 18    Temp: 99.3 F (37.4 C) 98.1 F (36.7 C) 98.3 F (36.8 C)   TempSrc: Oral Oral Oral   SpO2: 92% 95% 95%   Weight:      Height:        Intake/Output Summary (Last 24 hours) at 08/26/2020 1046 Last data filed at 08/26/2020 0520 Gross per 24 hour  Intake -  Output 600 ml  Net -600 ml   Filed Weights   08/19/20 1934 08/20/20 0346  Weight: 80.7 kg 81.6 kg    Examination:  Constitutional: NAD Eyes: lids and conjunctivae normal, no scleral icterus ENMT: mmm Neck: normal, supple Respiratory: clear to auscultation bilaterally, no wheezing, no  crackles. Normal respiratory effort.  Cardiovascular: Regular rate and rhythm, no murmurs / rubs / gallops. No LE edema. Abdomen: soft, no distention, no tenderness. Bowel sounds positive.  Skin: no rashes Neurologic: no focal deficits, equal strength  Data Reviewed: I have independently reviewed following labs and imaging studies   CBC: Recent Labs  Lab 08/19/20 1943 08/19/20 2022 08/20/20 0053 08/20/20 0352 08/22/20 0317 08/23/20 0028  WBC 11.5*  --  10.9* 8.8 6.3 6.6  NEUTROABS 8.6*  --   --   --   --   --   HGB 12.5 11.6* 11.8* 11.0* 10.0* 10.6*  HCT 39.4 34.0* 34.5* 34.1* 31.1* 33.4*  MCV 101.5*  --  99.1 99.7 100.6* 100.9*  PLT 265  --  265 230 226 425   Basic Metabolic Panel: Recent Labs  Lab 08/19/20 2230 08/20/20 0053 08/21/20 1034 08/22/20 0317 08/23/20 0028 08/25/20 0235 08/26/20 0434  NA  --    < > 140 141 140 139 139  K  --    < > 3.7 3.4* 4.3 3.8 3.7  CL  --    < > 105 105 102 98 101  CO2  --    < > 27 27 30 31 29   GLUCOSE  --    < > 156* 98 106* 101* 100*  BUN  --    < > 24* 15 12 13 16   CREATININE  --    < > 0.77 0.63 0.68 0.68 0.77  CALCIUM  --    < > 9.1 8.7* 9.2 9.3 9.3  MG 2.0  --   --   --   --   --   --    < > = values in this interval not displayed.   Liver Function Tests: Recent Labs  Lab 08/19/20 1943 08/20/20 0352  AST 27 24  ALT 23 18  ALKPHOS 44 40  BILITOT 0.9 1.1  PROT 6.2* 5.6*  ALBUMIN 3.5 3.1*   Coagulation Profile: No results for input(s): INR, PROTIME in the last 168 hours. HbA1C: No results for input(s): HGBA1C in the last 72 hours. CBG: No results for input(s): GLUCAP in the last 168 hours.  Recent Results (from the past 240 hour(s))  Resp Panel by RT-PCR (Flu A&B, Covid) Nasopharyngeal Swab     Status: None   Collection Time: 08/20/20  1:06 AM   Specimen: Nasopharyngeal Swab; Nasopharyngeal(NP) swabs in vial transport medium  Result Value Ref Range Status   SARS Coronavirus 2 by RT PCR NEGATIVE NEGATIVE Final     Comment: (NOTE) SARS-CoV-2 target nucleic acids are NOT DETECTED.  The SARS-CoV-2 RNA is generally detectable in upper respiratory specimens during the acute phase of infection. The lowest concentration of SARS-CoV-2 viral copies this assay  can detect is 138 copies/mL. A negative result does not preclude SARS-Cov-2 infection and should not be used as the sole basis for treatment or other patient management decisions. A negative result may occur with  improper specimen collection/handling, submission of specimen other than nasopharyngeal swab, presence of viral mutation(s) within the areas targeted by this assay, and inadequate number of viral copies(<138 copies/mL). A negative result must be combined with clinical observations, patient history, and epidemiological information. The expected result is Negative.  Fact Sheet for Patients:  EntrepreneurPulse.com.au  Fact Sheet for Healthcare Providers:  IncredibleEmployment.be  This test is no t yet approved or cleared by the [place] FDA and  has been authorized for detection and/or diagnosis of SARS-CoV-2 by FDA under an Emergency Use Authorization (EUA). This EUA will remain  in effect (meaning this test can be used) for the duration of the COVID-19 declaration under Section 564(b)(1) of the Act, 21 U.S.C.section 360bbb-3(b)(1), unless the authorization is terminated  or revoked sooner.       Influenza A by PCR NEGATIVE NEGATIVE Final   Influenza B by PCR NEGATIVE NEGATIVE Final    Comment: (NOTE) The Xpert Xpress SARS-CoV-2/FLU/RSV plus assay is intended as an aid in the diagnosis of influenza from Nasopharyngeal swab specimens and should not be used as a sole basis for treatment. Nasal washings and aspirates are unacceptable for Xpert Xpress SARS-CoV-2/FLU/RSV testing.  Fact Sheet for Patients: EntrepreneurPulse.com.au  Fact Sheet for Healthcare  Providers: IncredibleEmployment.be  This test is not yet approved or cleared by the [place] FDA and has been authorized for detection and/or diagnosis of SARS-CoV-2 by FDA under an Emergency Use Authorization (EUA). This EUA will remain in effect (meaning this test can be used) for the duration of the COVID-19 declaration under Section 564(b)(1) of the Act, 21 U.S.C. section 360bbb-3(b)(1), unless the authorization is terminated or revoked.  Performed at [facility], [address], [place], [place] [address]   Urine culture     Status: None   Collection Time: 08/20/20  3:08 PM   Specimen: Urine, Random  Result Value Ref Range Status   Specimen Description URINE, RANDOM  Final   Special Requests NONE  Final   Culture   Final    NO GROWTH Performed at [facility], [place] [address], [place], [place] [address]    Report Status 08/21/2020 FINAL  Final     Radiology Studies: No results found.  Marzetta Board, MD, PhD [facility]  Between 7 am - 7 pm I am available, please contact me via Amion or Securechat  Between 7 pm - 7 am I am not available, please contact night coverage MD/APP via Amion

## 2020-08-27 DIAGNOSIS — I10 Essential (primary) hypertension: Secondary | ICD-10-CM | POA: Diagnosis not present

## 2020-08-27 DIAGNOSIS — R778 Other specified abnormalities of plasma proteins: Secondary | ICD-10-CM | POA: Diagnosis not present

## 2020-08-27 DIAGNOSIS — E876 Hypokalemia: Secondary | ICD-10-CM | POA: Diagnosis not present

## 2020-08-27 MED ORDER — AMLODIPINE BESYLATE 5 MG PO TABS: 5.0000 mg | ORAL_TABLET | Freq: Every day | ORAL | 1 refills | Status: DC

## 2020-08-27 NOTE — Discharge Instructions (Signed)
Follow with Sarah Melter, MD in 5-7 days  Please get a complete blood count and chemistry panel checked by your Primary MD at your next visit, and again as instructed by your Primary MD. Please get your medications reviewed and adjusted by your Primary MD.  Please request your Primary MD to go over all Hospital Tests and Procedure/Radiological results at the follow up, please get all Hospital records sent to your Prim MD by signing hospital release before you go home.  In some cases, there will be blood work, cultures and biopsy results pending at the time of your discharge. Please request that your primary care M.D. goes through all the records of your hospital data and follows up on these results.  If you had Pneumonia of Lung problems at the Hospital: Please get a 2 view Chest X ray done in 6-8 weeks after hospital discharge or sooner if instructed by your Primary MD.  If you have Congestive Heart Failure: Please call your Cardiologist or Primary MD anytime you have any of the following symptoms:  1) 3 pound weight gain in 24 hours or 5 pounds in 1 week  2) shortness of breath, with or without a dry hacking cough  3) swelling in the hands, feet or stomach  4) if you have to sleep on extra pillows at night in order to breathe  Follow cardiac low salt diet and 1.5 lit/day fluid restriction.  If you have diabetes Accuchecks 4 times/day, Once in AM empty stomach and then before each meal. Log in all results and show them to your primary doctor at your next visit. If any glucose reading is under 80 or above 300 call your primary MD immediately.  If you have Seizure/Convulsions/Epilepsy: Please do not drive, operate heavy machinery, participate in activities at heights or participate in high speed sports until you have seen by Primary MD or a Neurologist and advised to do so again. Per [facility] statutes, patients with seizures are not allowed to drive until they have been  seizure-free for six months.  Use caution when using heavy equipment or power tools. Avoid working on ladders or at heights. Take showers instead of baths. Ensure the water temperature is not too high on the home water heater. Do not go swimming alone. Do not lock yourself in a room alone (i.e. bathroom). When caring for infants or small children, sit down when holding, feeding, or changing them to minimize risk of injury to the child in the event you have a seizure. Maintain good sleep hygiene. Avoid alcohol.   If you had Gastrointestinal Bleeding: Please ask your Primary MD to check a complete blood count within one week of discharge or at your next visit. Your endoscopic/colonoscopic biopsies that are pending at the time of discharge, will also need to followed by your Primary MD.  Get Medicines reviewed and adjusted. Please take all your medications with you for your next visit with your Primary MD  Please request your Primary MD to go over all hospital tests and procedure/radiological results at the follow up, please ask your Primary MD to get all Hospital records sent to his/her office.  If you experience worsening of your admission symptoms, develop shortness of breath, life threatening emergency, suicidal or homicidal thoughts you must seek medical attention immediately by calling 911 or calling your MD immediately  if symptoms less severe.  You must read complete instructions/literature along with all the possible adverse reactions/side effects for all the Medicines you take  and that have been prescribed to you. Take any new Medicines after you have completely understood and accpet all the possible adverse reactions/side effects.   Do not drive or operate heavy machinery when taking Pain medications.   Do not take more than prescribed Pain, Sleep and Anxiety Medications  Special Instructions: If you have smoked or chewed Tobacco  in the last 2 yrs please stop smoking, stop any regular  Alcohol  and or any Recreational drug use.  Wear Seat belts while driving.  Please note You were cared for by a hospitalist during your hospital stay. If you have any questions about your discharge medications or the care you received while you were in the hospital after you are discharged, you can call the unit and asked to speak with the hospitalist on call if the hospitalist that took care of you is not available. Once you are discharged, your primary care physician will handle any further medical issues. Please note that NO REFILLS for any discharge medications will be authorized once you are discharged, as it is imperative that you return to your primary care physician (or establish a relationship with a primary care physician if you do not have one) for your aftercare needs so that they can reassess your need for medications and monitor your lab values.  You can reach the hospitalist office at phone [phone] or fax [phone]   If you do not have a primary care physician, you can call [phone] for a physician referral.  Activity: As tolerated with Full fall precautions use walker/cane & assistance as needed    Diet: low sodium  Disposition Home

## 2020-08-27 NOTE — Progress Notes (Signed)
Physical Therapy Treatment Patient Details Name: [name] MRN: [id number] DOB: Apr 01, 1941 Today's Date: 08/27/2020    History of Present Illness 80yo female brought to the ED by her son due to significant weakness, DOE, and increased urinary frequency. EKG with L BBB but per cardio note this is chronic. Found to have severe  hypokalemia. PMH anxiety and depression, AAA, skin cancer, hx clavicle fracture, HTN, osteoporosis, L rotator cuff repair, THA    PT Comments    Pt received in bed, sleeping but easily woken and willing to participate in PT. Min guard for all mobility for safety including ~150 ft ambulation with RW. Pt able to sequence supine to sit correctly with increased time and use of handrails. Improved navigation with RW, minimal cueing to keep RW at appropriate distance during turns and to look up when walking. No overt LOB, but reliant on B UE in standing.Needed increased time to process due to difficulty sequencing and decreased problem solving. Continue to recommend OOB supervision for mobility with RW and continued PT at [facility]. Pt agreeable to plan. Pt left in chair with all needs met, call bell within reach, and chair alarm active.    Follow Up Recommendations  Home health PT;Supervision for mobility/OOB ([facility] with increased care packages initially)     Equipment Recommendations  Rolling walker with 5" wheels;3in1 (PT)    Recommendations for Other Services       Precautions / Restrictions Precautions Precautions: Fall Precaution Comments: R shoulder pain/weakness Restrictions Weight Bearing Restrictions: No    Mobility  Bed Mobility Overal bed mobility: Needs Assistance Bed Mobility: Supine to Sit     Supine to sit: Min guard     General bed mobility comments: Pt moved legs first and then needed increased time to bring trunk upright, use of bedrails, but no physical assist give. Verbal cueing to scoot forwards to EOB. Min guard for safety     Transfers Overall transfer level: Needs assistance Equipment used: Rolling walker (2 wheeled) Transfers: Sit to/from Stand Sit to Stand: Min guard         General transfer comment: Min guard for safety and to ensure steadiness upon standing. Pt with correct hand placement to push from EOB without VC.  Ambulation/Gait Ambulation/Gait assistance: Min guard Gait Distance (Feet): 150 Feet Assistive device: Rolling walker (2 wheeled) Gait Pattern/deviations: Decreased stride length;Step-to pattern;Step-through pattern Gait velocity: decreased   General Gait Details: Relaint on RW for B UE support, but able to navigate well. Minimal cueing to stay close to RW during turns and to keep head up   Stairs             Wheelchair Mobility    Modified Ellison (Stroke Patients Only)       Balance Overall balance assessment: Needs assistance;History of Falls Sitting-balance support: Feet supported;Bilateral upper extremity supported Sitting balance-Leahy Scale: Fair     Standing balance support: Bilateral upper extremity supported;During functional activity Standing balance-Leahy Scale: Poor Standing balance comment: Reliant on B UE support                            Cognition Arousal/Alertness: Awake/alert Behavior During Therapy: Flat affect Overall Cognitive Status: No family/caregiver present to determine baseline cognitive functioning Area of Impairment: Attention;Memory;Following commands;Safety/judgement;Awareness;Problem solving                   Current Attention Level: Sustained Memory: Decreased short-term memory Following Commands: Follows one step commands  consistently;Follows one step commands with increased time Safety/Judgement: Decreased awareness of safety;Decreased awareness of deficits Awareness: Intellectual Problem Solving: Difficulty sequencing;Requires verbal cues;Slow processing;Decreased initiation General Comments:  Demonstrated improved sequencing, but needed increased time for processing and to complete tasks      Exercises      General Comments        Pertinent Vitals/Pain Pain Assessment: No/denies pain    Home Living                      Prior Function            PT Goals (current goals can now be found in the care plan section) Acute Rehab PT Goals Patient Stated Goal: get stronger PT Goal Formulation: With patient Time For Goal Achievement: 09/04/20 Potential to Achieve Goals: Good Progress towards PT goals: Progressing toward goals    Frequency    Min 3X/week      PT Plan Current plan remains appropriate    Co-evaluation              AM-PAC PT "6 Clicks" Mobility   Outcome Measure  Help needed turning from your back to your side while in a flat bed without using bedrails?: A Little Help needed moving from lying on your back to sitting on the side of a flat bed without using bedrails?: A Little Help needed moving to and from a bed to a chair (including a wheelchair)?: A Little Help needed standing up from a chair using your arms (e.g., wheelchair or bedside chair)?: A Little Help needed to walk in hospital room?: A Little Help needed climbing 3-5 steps with a railing? : A Lot 6 Click Score: 17    End of Session Equipment Utilized During Treatment: Gait belt Activity Tolerance: Patient tolerated treatment well Patient left: in chair;with call bell/phone within reach;with chair alarm set Nurse Communication: Mobility status PT Visit Diagnosis: Unsteadiness on feet (R26.81);Difficulty in walking, not elsewhere classified (R26.2);Muscle weakness (generalized) (M62.81);History of falling (Z91.81)     Time:  -     Charges:                        Rosita Kea, SPT

## 2020-08-27 NOTE — Discharge Summary (Signed)
Physician Discharge Summary  [name] ONG:[id number] DOB: [birth year] DOA: 08/19/2020  PCP: Orpah Melter, MD  Admit date: 08/19/2020 Discharge date: 08/27/2020  Admitted From: home Disposition:  ILF  Recommendations for Outpatient Follow-up:  1. Follow up with PCP in 1-2 weeks  Home Health: PT Equipment/Devices: walker, 3 in 1   Discharge Condition: stable CODE STATUS: Full code Diet recommendation: low sodium  HPI: Per admitting MD, [name] is a 80 y.o. female with medical history significant of hypertension, aortic aneurysm, anxiety with depression, GERD, who was brought in by [place] son from home with significant weakness exertional dyspnea and urinary frequency.  Patient has been having excessive urination nightly.  She also has been feeling weak not getting out of bed easily.  She was brought into the ER and evaluated.  Patient noted to have potassium of 2.6.  Did not have any evidence of fluid overload.  Has had recent echocardiogram in December that showed normal EF.  Patient is on diuretics with Maxide.  She also has some leukocytosis.  Suspicion therefore is for hypokalemia secondary to diuretics.  Additionally she has markedly elevated troponins which may indicate possible cardiac event.  She is therefore being admitted for further evaluation and treatment.  Hospital Course / Discharge diagnoses: Principal Problem Hypokalemia, generalized weakness -patient was admitted to the hospital with generalized weakness, found to have severe hypokalemia.  Her potassium was aggressively repleted and has improved and has remained stable.  Recommend PCP follow-up in a week to recheck her BMP.  This is believed to be due to her diuretics, hold on discharge.  Active Problems Troponin elevation, dyspnea on exertion -Cardiology consulted, appreciate input.  No further work-up necessary Mild memory problems / disposition -Patient son tells me he is suspecting some degree of early  dementia as patient has been having worsening memory problems, he also is not sure whether she is taking the medications as scheduled at home, is increasingly concerned about her safety.    He was able to secure independent living facility with additional support, and the patient and will be discharged to ALF in stable condition. Urinary frequency -No UTI on urinalysis, likely due to home diuretics but also this is a chronic issue for her and patient's son told me she has been wearing depends for a number of years. Recommend Kegel excercises  Hyperlipidemia -Continue statin Essential hypertension -Home diuretics on hold due to hyperkalemia, continue home Coreg,  amlodipine was added to her regimen to assist with blood pressure control  Sepsis ruled out   Discharge Instructions   Allergies as of 08/27/2020      Reactions   Molds & Smuts    Other Other (See Comments)   Dust mites - Upper respiratory congestion   Statins    myalgias      Medication List    STOP taking these medications   triamterene-hydrochlorothiazide 37.5-25 MG tablet Commonly known as: MAXZIDE-25     TAKE these medications   amLODipine 5 MG tablet Commonly known as: NORVASC Take 1 tablet (5 mg total) by mouth daily. Start taking on: August 28, 2020   aspirin 325 MG tablet Take 650 mg by mouth in the morning and at bedtime.   carvedilol 12.5 MG tablet Commonly known as: COREG TAKE 1 TABLET BY MOUTH TWICE A DAY WITH FOOD   levocetirizine 5 MG tablet Commonly known as: XYZAL Take 5 mg by mouth daily as needed for allergies.   PreserVision AREDS 2 Caps Take 1 capsule  by mouth 2 (two) times daily.   rosuvastatin 10 MG tablet Commonly known as: CRESTOR Take 1 tablet (10 mg total) by mouth daily.            Durable Medical Equipment  (From admission, onward)         Start     Ordered   08/26/20 1531  For home use only DME 3 n 1  Once        08/26/20 1531   08/26/20 1531  For home use only DME  Walker rolling  Once       Question Answer Comment  Walker: With [place] Wheels   Patient needs a walker to treat with the following condition Weakness      08/26/20 1531          Follow-up Information    Orpah Melter, MD. Schedule an appointment as soon as possible for a visit in 1 week(s).   Specialty: Family Medicine Contact information: [place] [address] [phone]        Donato Heinz, MD .   Specialties: Cardiology, Radiology Contact information: [address] [place] [address] [phone]               Consultations: Cardiology   Procedures/Studies:  MR BRAIN WO CONTRAST  Result Date: 08/21/2020 CLINICAL DATA:  Confusion EXAM: MRI HEAD WITHOUT CONTRAST TECHNIQUE: Multiplanar, multiecho pulse sequences of the brain and surrounding structures were obtained without intravenous contrast. COMPARISON:  None. FINDINGS: Brain: There is no acute infarction or intracranial hemorrhage. There is no intracranial mass, mass effect, or edema. There is no hydrocephalus or extra-axial fluid collection. Prominence of the ventricles sulci reflects generalized parenchymal volume loss. Patchy and confluent areas of T2 hyperintensity in the supratentorial white matter are nonspecific but probably reflect moderate chronic microvascular ischemic changes. There is a chronic small vessel infarct of the periventricular white matter adjacent to body of ventricle. Vascular: Major vessel flow voids at the skull base are preserved. Skull and upper cervical spine: Normal marrow signal is preserved. Sinuses/Orbits: Minor mucosal thickening.  Orbits are unremarkable. Other: Sella is unremarkable.  Mastoid air cells are clear. IMPRESSION: No evidence of recent infarction, hemorrhage, or mass. Moderate chronic microvascular ischemic changes. Electronically Signed   By: Macy Mis M.D.   On: 08/21/2020 12:09   DG Chest Portable 1  View  Result Date: 08/19/2020 CLINICAL DATA:  Weakness.  Incontinence. EXAM: PORTABLE CHEST 1 VIEW COMPARISON:  01/12/2019 chest radiograph.  CTA chest of 06/11/2020 FINDINGS: Midline trachea. Mild cardiomegaly. Ascending aortic aneurysm was better evaluated on prior CT, but is grossly similar. No pleural effusion or pneumothorax. Mild right hemidiaphragm elevation. Clear lungs. IMPRESSION: No acute cardiopulmonary disease. Ascending aortic aneurysm, as on CTA of 06/11/2020. Electronically Signed   By: Abigail Miyamoto M.D.   On: 08/19/2020 20:31      Subjective: - no chest pain, shortness of breath, no abdominal pain, nausea or vomiting.   Discharge Exam: BP 140/74 (BP Location: Left Arm)   Pulse 74   Temp 98 F (36.7 C) (Oral)   Resp 18   Ht 5' 3.5" (1.613 m)   Wt 81.6 kg   SpO2 93%   BMI 31.39 kg/m   General: Pt is alert, awake, not in acute distress Cardiovascular: RRR, S1/S2 +, no rubs, no gallops Respiratory: CTA bilaterally, no wheezing, no rhonchi Abdominal: Soft, NT, ND, bowel sounds + Extremities: no edema, no cyanosis  The results of significant diagnostics from this hospitalization (including imaging, microbiology, ancillary and laboratory) are listed below for reference.     Microbiology: Recent Results (from the past 240 hour(s))  Resp Panel by RT-PCR (Flu A&B, Covid) Nasopharyngeal Swab     Status: None   Collection Time: 08/20/20  1:06 AM   Specimen: Nasopharyngeal Swab; Nasopharyngeal(NP) swabs in vial transport medium  Result Value Ref Range Status   SARS Coronavirus 2 by RT PCR NEGATIVE NEGATIVE Final    Comment: (NOTE) SARS-CoV-2 target nucleic acids are NOT DETECTED.  The SARS-CoV-2 RNA is generally detectable in upper respiratory specimens during the acute phase of infection. The lowest concentration of SARS-CoV-2 viral copies this assay can detect is 138 copies/mL. A negative result does not preclude SARS-Cov-2 infection and should not be used as the  sole basis for treatment or other patient management decisions. A negative result may occur with  improper specimen collection/handling, submission of specimen other than nasopharyngeal swab, presence of viral mutation(s) within the areas targeted by this assay, and inadequate number of viral copies(<138 copies/mL). A negative result must be combined with clinical observations, patient history, and epidemiological information. The expected result is Negative.  Fact Sheet for Patients:  EntrepreneurPulse.com.au  Fact Sheet for Healthcare Providers:  IncredibleEmployment.be  This test is no t yet approved or cleared by the [place] FDA and  has been authorized for detection and/or diagnosis of SARS-CoV-2 by FDA under an Emergency Use Authorization (EUA). This EUA will remain  in effect (meaning this test can be used) for the duration of the COVID-19 declaration under Section 564(b)(1) of the Act, 21 U.S.C.section 360bbb-3(b)(1), unless the authorization is terminated  or revoked sooner.       Influenza A by PCR NEGATIVE NEGATIVE Final   Influenza B by PCR NEGATIVE NEGATIVE Final    Comment: (NOTE) The Xpert Xpress SARS-CoV-2/FLU/RSV plus assay is intended as an aid in the diagnosis of influenza from Nasopharyngeal swab specimens and should not be used as a sole basis for treatment. Nasal washings and aspirates are unacceptable for Xpert Xpress SARS-CoV-2/FLU/RSV testing.  Fact Sheet for Patients: EntrepreneurPulse.com.au  Fact Sheet for Healthcare Providers: IncredibleEmployment.be  This test is not yet approved or cleared by the [place] FDA and has been authorized for detection and/or diagnosis of SARS-CoV-2 by FDA under an Emergency Use Authorization (EUA). This EUA will remain in effect (meaning this test can be used) for the duration of the COVID-19 declaration under Section 564(b)(1) of the  Act, 21 U.S.C. section 360bbb-3(b)(1), unless the authorization is terminated or revoked.  Performed at [facility], [place] [address] [place], [place] [address]   Urine culture     Status: None   Collection Time: 08/20/20  3:08 PM   Specimen: Urine, Random  Result Value Ref Range Status   Specimen Description URINE, RANDOM  Final   Special Requests NONE  Final   Culture   Final    NO GROWTH Performed at [facility], [place] [address], [place], [place] [address]    Report Status 08/21/2020 FINAL  Final     Labs: Basic Metabolic Panel: Recent Labs  Lab 08/21/20 1034 08/22/20 0317 08/23/20 0028 08/25/20 0235 08/26/20 0434  NA 140 141 140 139 139  K 3.7 3.4* 4.3 3.8 3.7  CL 105 105 102 98 101  CO2 27 27 30 31 29   GLUCOSE 156* 98 106* 101* 100*  BUN 24* 15 12 13 16   CREATININE  0.77 0.63 0.68 0.68 0.77  CALCIUM 9.1 8.7* 9.2 9.3 9.3   Liver Function Tests: No results for input(s): AST, ALT, ALKPHOS, BILITOT, PROT, ALBUMIN in the last 168 hours. CBC: Recent Labs  Lab 08/22/20 0317 08/23/20 0028  WBC 6.3 6.6  HGB 10.0* 10.6*  HCT 31.1* 33.4*  MCV 100.6* 100.9*  PLT 226 257   CBG: No results for input(s): GLUCAP in the last 168 hours. Hgb A1c No results for input(s): HGBA1C in the last 72 hours. Lipid Profile No results for input(s): CHOL, HDL, LDLCALC, TRIG, CHOLHDL, LDLDIRECT in the last 72 hours. Thyroid function studies No results for input(s): TSH, T4TOTAL, T3FREE, THYROIDAB in the last 72 hours.  Invalid input(s): FREET3 Urinalysis    Component Value Date/Time   COLORURINE YELLOW 08/19/2020 [place] 08/19/2020 1508   LABSPEC 1.024 08/19/2020 1508   PHURINE 5.0 08/19/2020 1508   [place] 08/19/2020 1508   [place] 08/19/2020 1508   BILIRUBINUR NEGATIVE 08/19/2020 1508   [place] 08/19/2020 1508   PROTEINUR 30 (A) 08/19/2020 1508   NITRITE NEGATIVE 08/19/2020 1508   LEUKOCYTESUR NEGATIVE 08/19/2020  1508    FURTHER DISCHARGE INSTRUCTIONS:   Get Medicines reviewed and adjusted: Please take all your medications with you for your next visit with your Primary MD   Laboratory/radiological data: Please request your Primary MD to go over all hospital tests and procedure/radiological results at the follow up, please ask your Primary MD to get all Hospital records sent to his/her office.   In some cases, they will be blood work, cultures and biopsy results pending at the time of your discharge. Please request that your primary care M.D. goes through all the records of your hospital data and follows up on these results.   Also Note the following: If you experience worsening of your admission symptoms, develop shortness of breath, life threatening emergency, suicidal or homicidal thoughts you must seek medical attention immediately by calling 911 or calling your MD immediately  if symptoms less severe.   You must read complete instructions/literature along with all the possible adverse reactions/side effects for all the Medicines you take and that have been prescribed to you. Take any new Medicines after you have completely understood and accpet all the possible adverse reactions/side effects.    Do not drive when taking Pain medications or sleeping medications (Benzodaizepines)   Do not take more than prescribed Pain, Sleep and Anxiety Medications. It is not advisable to combine anxiety,sleep and pain medications without talking with your primary care practitioner   Special Instructions: If you have smoked or chewed Tobacco  in the last 2 yrs please stop smoking, stop any regular Alcohol  and or any Recreational drug use.   Wear Seat belts while driving.   Please note: You were cared for by a hospitalist during your hospital stay. Once you are discharged, your primary care physician will handle any further medical issues. Please note that NO REFILLS for any discharge medications will be  authorized once you are discharged, as it is imperative that you return to your primary care physician (or establish a relationship with a primary care physician if you do not have one) for your post hospital discharge needs so that they can reassess your need for medications and monitor your lab values.  Time coordinating discharge: 40 minutes  SIGNED:  Marzetta Board, MD, PhD 08/27/2020, 9:00 AM

## 2020-08-30 DIAGNOSIS — M6281 Muscle weakness (generalized): Secondary | ICD-10-CM | POA: Diagnosis not present

## 2020-08-30 DIAGNOSIS — R279 Unspecified lack of coordination: Secondary | ICD-10-CM | POA: Diagnosis not present

## 2020-08-30 DIAGNOSIS — R262 Difficulty in walking, not elsewhere classified: Secondary | ICD-10-CM | POA: Diagnosis not present

## 2020-09-03 DIAGNOSIS — M6281 Muscle weakness (generalized): Secondary | ICD-10-CM | POA: Diagnosis not present

## 2020-09-03 DIAGNOSIS — I10 Essential (primary) hypertension: Secondary | ICD-10-CM | POA: Diagnosis not present

## 2020-09-03 DIAGNOSIS — E876 Hypokalemia: Secondary | ICD-10-CM | POA: Diagnosis not present

## 2020-09-03 DIAGNOSIS — R413 Other amnesia: Secondary | ICD-10-CM | POA: Diagnosis not present

## 2020-09-03 DIAGNOSIS — Z0000 Encounter for general adult medical examination without abnormal findings: Secondary | ICD-10-CM | POA: Diagnosis not present

## 2020-09-03 DIAGNOSIS — Z23 Encounter for immunization: Secondary | ICD-10-CM | POA: Diagnosis not present

## 2020-09-05 DIAGNOSIS — R262 Difficulty in walking, not elsewhere classified: Secondary | ICD-10-CM | POA: Diagnosis not present

## 2020-09-05 DIAGNOSIS — M6281 Muscle weakness (generalized): Secondary | ICD-10-CM | POA: Diagnosis not present

## 2020-09-05 DIAGNOSIS — R279 Unspecified lack of coordination: Secondary | ICD-10-CM | POA: Diagnosis not present

## 2020-09-06 DIAGNOSIS — R262 Difficulty in walking, not elsewhere classified: Secondary | ICD-10-CM | POA: Diagnosis not present

## 2020-09-06 DIAGNOSIS — R279 Unspecified lack of coordination: Secondary | ICD-10-CM | POA: Diagnosis not present

## 2020-09-06 DIAGNOSIS — M6281 Muscle weakness (generalized): Secondary | ICD-10-CM | POA: Diagnosis not present

## 2020-09-09 DIAGNOSIS — R262 Difficulty in walking, not elsewhere classified: Secondary | ICD-10-CM | POA: Diagnosis not present

## 2020-09-09 DIAGNOSIS — R279 Unspecified lack of coordination: Secondary | ICD-10-CM | POA: Diagnosis not present

## 2020-09-09 DIAGNOSIS — M6281 Muscle weakness (generalized): Secondary | ICD-10-CM | POA: Diagnosis not present

## 2020-09-10 DIAGNOSIS — M6281 Muscle weakness (generalized): Secondary | ICD-10-CM | POA: Diagnosis not present

## 2020-09-11 DIAGNOSIS — R262 Difficulty in walking, not elsewhere classified: Secondary | ICD-10-CM | POA: Diagnosis not present

## 2020-09-11 DIAGNOSIS — R279 Unspecified lack of coordination: Secondary | ICD-10-CM | POA: Diagnosis not present

## 2020-09-11 DIAGNOSIS — M6281 Muscle weakness (generalized): Secondary | ICD-10-CM | POA: Diagnosis not present

## 2020-09-12 DIAGNOSIS — R279 Unspecified lack of coordination: Secondary | ICD-10-CM | POA: Diagnosis not present

## 2020-09-12 DIAGNOSIS — M6281 Muscle weakness (generalized): Secondary | ICD-10-CM | POA: Diagnosis not present

## 2020-09-12 DIAGNOSIS — R262 Difficulty in walking, not elsewhere classified: Secondary | ICD-10-CM | POA: Diagnosis not present

## 2020-09-24 DIAGNOSIS — M6281 Muscle weakness (generalized): Secondary | ICD-10-CM | POA: Diagnosis not present

## 2020-09-24 DIAGNOSIS — R262 Difficulty in walking, not elsewhere classified: Secondary | ICD-10-CM | POA: Diagnosis not present

## 2020-09-24 DIAGNOSIS — R279 Unspecified lack of coordination: Secondary | ICD-10-CM | POA: Diagnosis not present

## 2020-09-27 DIAGNOSIS — R488 Other symbolic dysfunctions: Secondary | ICD-10-CM | POA: Diagnosis not present

## 2020-09-27 DIAGNOSIS — R41841 Cognitive communication deficit: Secondary | ICD-10-CM | POA: Diagnosis not present

## 2020-09-30 DIAGNOSIS — I10 Essential (primary) hypertension: Secondary | ICD-10-CM | POA: Diagnosis not present

## 2020-09-30 DIAGNOSIS — R413 Other amnesia: Secondary | ICD-10-CM | POA: Diagnosis not present

## 2020-11-08 ENCOUNTER — Other Ambulatory Visit: Payer: Self-pay

## 2020-11-08 ENCOUNTER — Telehealth: Payer: Self-pay | Admitting: Cardiology

## 2020-11-08 DIAGNOSIS — I712 Thoracic aortic aneurysm, without rupture, unspecified: Secondary | ICD-10-CM

## 2020-11-08 NOTE — Telephone Encounter (Signed)
Left message for patient to call and discuss scheduling the CTA chest/aorta in June ordered by Dr. Gardiner Rhyme

## 2020-11-12 NOTE — Telephone Encounter (Signed)
Spoke with patient's son [name] DPR) regarding the Wednesday 11/20/20 10:00 am CTA chest/aorta scheduled at [facility], [address]---arrival time is 9:45 am for check in ---Liquids only 4 hours prior to study----patient to come in this week for lab work.  Mitzi Hansen voiced his understanding.

## 2020-11-20 ENCOUNTER — Ambulatory Visit (HOSPITAL_BASED_OUTPATIENT_CLINIC_OR_DEPARTMENT_OTHER): Payer: Medicare PPO

## 2020-12-02 DIAGNOSIS — Z20828 Contact with and (suspected) exposure to other viral communicable diseases: Secondary | ICD-10-CM | POA: Diagnosis not present

## 2020-12-06 ENCOUNTER — Emergency Department (HOSPITAL_COMMUNITY): Payer: Medicare PPO

## 2020-12-06 ENCOUNTER — Other Ambulatory Visit: Payer: Self-pay

## 2020-12-06 ENCOUNTER — Emergency Department (HOSPITAL_COMMUNITY)
Admission: EM | Admit: 2020-12-06 | Discharge: 2020-12-06 | Disposition: A | Discharge status: Home / Self Care | Payer: Medicare PPO | Attending: Emergency Medicine | Admitting: Emergency Medicine

## 2020-12-06 ENCOUNTER — Telehealth: Payer: Self-pay | Admitting: Cardiology

## 2020-12-06 DIAGNOSIS — F0390 Unspecified dementia without behavioral disturbance: Secondary | ICD-10-CM | POA: Diagnosis not present

## 2020-12-06 DIAGNOSIS — R531 Weakness: Secondary | ICD-10-CM | POA: Diagnosis not present

## 2020-12-06 DIAGNOSIS — R410 Disorientation, unspecified: Secondary | ICD-10-CM | POA: Diagnosis not present

## 2020-12-06 DIAGNOSIS — Z96641 Presence of right artificial hip joint: Secondary | ICD-10-CM | POA: Insufficient documentation

## 2020-12-06 DIAGNOSIS — Z79899 Other long term (current) drug therapy: Secondary | ICD-10-CM | POA: Diagnosis not present

## 2020-12-06 DIAGNOSIS — I10 Essential (primary) hypertension: Secondary | ICD-10-CM | POA: Diagnosis not present

## 2020-12-06 DIAGNOSIS — R4182 Altered mental status, unspecified: Secondary | ICD-10-CM | POA: Diagnosis not present

## 2020-12-06 DIAGNOSIS — Z7982 Long term (current) use of aspirin: Secondary | ICD-10-CM | POA: Insufficient documentation

## 2020-12-06 DIAGNOSIS — R000 Tachycardia, unspecified: Secondary | ICD-10-CM | POA: Diagnosis not present

## 2020-12-06 DIAGNOSIS — Z85828 Personal history of other malignant neoplasm of skin: Secondary | ICD-10-CM | POA: Insufficient documentation

## 2020-12-06 DIAGNOSIS — E876 Hypokalemia: Secondary | ICD-10-CM

## 2020-12-06 DIAGNOSIS — R0902 Hypoxemia: Secondary | ICD-10-CM | POA: Diagnosis not present

## 2020-12-06 DIAGNOSIS — R29810 Facial weakness: Secondary | ICD-10-CM | POA: Diagnosis not present

## 2020-12-06 LAB — URINALYSIS, ROUTINE W REFLEX MICROSCOPIC
Bacteria, UA: NONE SEEN
Bilirubin Urine: NEGATIVE
Glucose, UA: NEGATIVE mg/dL
Hgb urine dipstick: NEGATIVE
Ketones, ur: 20 mg/dL — AB
Leukocytes,Ua: NEGATIVE
Nitrite: NEGATIVE
Protein, ur: 100 mg/dL — AB
Specific Gravity, Urine: 1.025 (ref 1.005–1.030)
pH: 5 (ref 5.0–8.0)

## 2020-12-06 LAB — CBC WITH DIFFERENTIAL/PLATELET
Abs Immature Granulocytes: 0.07 10*3/uL (ref 0.00–0.07)
Basophils Absolute: 0 10*3/uL (ref 0.0–0.1)
Basophils Relative: 0 %
Eosinophils Absolute: 0 10*3/uL (ref 0.0–0.5)
Eosinophils Relative: 0 %
HCT: 40.1 % (ref 36.0–46.0)
Hemoglobin: 13.3 g/dL (ref 12.0–15.0)
Immature Granulocytes: 1 %
Lymphocytes Relative: 13 %
Lymphs Abs: 1.6 10*3/uL (ref 0.7–4.0)
MCH: 32.3 pg (ref 26.0–34.0)
MCHC: 33.2 g/dL (ref 30.0–36.0)
MCV: 97.3 fL (ref 80.0–100.0)
Monocytes Absolute: 1 10*3/uL (ref 0.1–1.0)
Monocytes Relative: 8 %
Neutro Abs: 9 10*3/uL — ABNORMAL HIGH (ref 1.7–7.7)
Neutrophils Relative %: 78 %
Platelets: 270 10*3/uL (ref 150–400)
RBC: 4.12 MIL/uL (ref 3.87–5.11)
RDW: 13.7 % (ref 11.5–15.5)
WBC: 11.7 10*3/uL — ABNORMAL HIGH (ref 4.0–10.5)
nRBC: 0.3 % — ABNORMAL HIGH (ref 0.0–0.2)

## 2020-12-06 LAB — COMPREHENSIVE METABOLIC PANEL
ALT: 14 U/L (ref 0–44)
AST: 18 U/L (ref 15–41)
Albumin: 3.7 g/dL (ref 3.5–5.0)
Alkaline Phosphatase: 61 U/L (ref 38–126)
Anion gap: 10 (ref 5–15)
BUN: 18 mg/dL (ref 8–23)
CO2: 29 mmol/L (ref 22–32)
Calcium: 9.7 mg/dL (ref 8.9–10.3)
Chloride: 98 mmol/L (ref 98–111)
Creatinine, Ser: 0.56 mg/dL (ref 0.44–1.00)
GFR, Estimated: >60 mL/min (ref >60)
Glucose, Bld: 113 mg/dL — ABNORMAL HIGH (ref 70–99)
Potassium: 3.3 mmol/L — ABNORMAL LOW (ref 3.5–5.1)
Sodium: 137 mmol/L (ref 135–145)
Total Bilirubin: 1.2 mg/dL (ref 0.3–1.2)
Total Protein: 6.4 g/dL — ABNORMAL LOW (ref 6.5–8.1)

## 2020-12-06 MED ORDER — POTASSIUM CHLORIDE ER 10 MEQ PO TBCR: 10.0000 meq | EXTENDED_RELEASE_TABLET | Freq: Two times a day (BID) | ORAL | 0 refills | Status: DC
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
## 2020-12-06 NOTE — ED Provider Notes (Signed)
  Physical Exam  BP 121/75   Pulse 95   Temp 98.5 F (36.9 C) (Oral)   Resp (!) 25   Ht 5\' 3"  (1.6 m)   Wt 82 kg   SpO2 95%   BMI 32.02 kg/m   Physical Exam  ED Course/Procedures     Procedures  MDM  Received patient in signout.  Possible fall.  Worsening mental status.  Lives at independent living.  Work-up reassuring.  Mild hypokalemia but believe can be supplemented orally.  Chest x-ray reassuring.  Not hypoxic on my exam.  Had long talk with patient and son.  Think stable for discharge home but potentially needs higher level of care.  Here to screen can help provide this.  Will discharge.       Davonna Belling, MD 12/06/20 2150

## 2020-12-06 NOTE — ED Triage Notes (Signed)
Brought in by EMS from [facility]. EMS reports altered mental status on set today. Prior history of UTI and confusion.

## 2020-12-06 NOTE — ED Notes (Signed)
Family updated as to patient's status.

## 2020-12-06 NOTE — Social Work (Signed)
CSW met with Pt and son at bedside.  Pt states that she has a walker and a cane that she uses at home.  Pt initially denied to CSW that she had fallen although chart indicated that she had.   Pt states that she is willing to participate in PT at home in her apartment.   CSW called [employer] and left a message for Melissa, Care Coordinator that Pt would be needing PT.

## 2020-12-06 NOTE — Telephone Encounter (Signed)
Spoke with son, he states patient was found on the floor at her facility and she may be going to the hospital----he will call back at a later date to reschedule

## 2020-12-06 NOTE — Discharge Instructions (Addendum)
You may end up needing either more help at home or a higher level of care.  [name] should help be able to arrange this tomorrow.  You can follow-up with one of the neurology groups to help with the changein the mental status

## 2020-12-12 DIAGNOSIS — Z20828 Contact with and (suspected) exposure to other viral communicable diseases: Secondary | ICD-10-CM | POA: Diagnosis not present

## 2020-12-19 DIAGNOSIS — Z20820 Contact with and (suspected) exposure to varicella: Secondary | ICD-10-CM | POA: Diagnosis not present

## 2021-01-02 DIAGNOSIS — Z20828 Contact with and (suspected) exposure to other viral communicable diseases: Secondary | ICD-10-CM | POA: Diagnosis not present

## 2021-01-07 DIAGNOSIS — Z20828 Contact with and (suspected) exposure to other viral communicable diseases: Secondary | ICD-10-CM | POA: Diagnosis not present

## 2021-01-09 DIAGNOSIS — Z20828 Contact with and (suspected) exposure to other viral communicable diseases: Secondary | ICD-10-CM | POA: Diagnosis not present

## 2021-02-20 DIAGNOSIS — Z8616 Personal history of COVID-19: Secondary | ICD-10-CM | POA: Diagnosis not present

## 2021-03-06 DIAGNOSIS — Z20822 Contact with and (suspected) exposure to covid-19: Secondary | ICD-10-CM | POA: Diagnosis not present

## 2021-03-13 DIAGNOSIS — Z8616 Personal history of COVID-19: Secondary | ICD-10-CM | POA: Diagnosis not present

## 2021-04-03 ENCOUNTER — Encounter: Payer: Self-pay | Admitting: Psychiatry

## 2021-04-03 ENCOUNTER — Ambulatory Visit: Payer: Medicare PPO | Admitting: Psychiatry

## 2021-04-03 VITALS — BP 139/85 | HR 86 | Ht 60.0 in

## 2021-04-03 DIAGNOSIS — F0280 Dementia in other diseases classified elsewhere without behavioral disturbance: Secondary | ICD-10-CM | POA: Diagnosis not present

## 2021-04-03 DIAGNOSIS — G309 Alzheimer's disease, unspecified: Secondary | ICD-10-CM
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
## 2021-04-03 NOTE — Patient Instructions (Addendum)
Your memory test today was suggestive of mild dementia. I would like you to get some blood work and do more extensive memory tests. Following these tests we can talk about possible medication options to help with memory.  Plan: Blood work Neuropsychological assessment   Preventing Falls at [facility] are common, often dreaded events in the lives of older people. Aside from the obvious injuries and even death that may result, fall can cause wide-ranging consequences including loss of independence, mental decline, decreased activity and mobility. Younger people are also at risk of falling, especially those with chronic illnesses and fatigue.  Ways to reduce risk for falling   Examine diet and medications. Warm foods and alcohol dilate blood vessels, which can lead to dizziness when standing. Sleep aids, antidepressants and pain medications can also increase the likelihood of a fall.   Get a vision exam. Poor vision, cataracts and glaucoma increase the chances of falling.   Check foot gear. Shoes should fit snugly and have a sturdy, nonskid sole and a broad, low heel   Participate in a physician-approved exercise program to build and maintain muscle strength and improve balance and coordination. Programs that use ankle weights or stretch bands are excellent for muscle-strengthening. Water aerobics programs and low-impact Tai Chi programs have also been shown to improve balance and coordination.   Increase vitamin D intake. Vitamin D improves muscle strength and increases the amount of calcium the body is able to absorb and deposit in bones.  How to prevent falls from common hazards   Floors -- Remove all loose wires, cords, and throw rugs. Minimize clutter. Make sure rugs are anchored and smooth. Keep furniture in its usual place.   Chairs -- Use chairs with straight backs, armrests and firm seats. Add firm cushions to existing pieces to add height.   Bathroom -- Install grab bars and non-skid tape in  the tub or shower. Use a bathtub transfer bench or a shower chair with a back support Use an elevated toilet seat and/or safety rails to assist standing from a low surface. Do not use towel racks or bathroom tissue holders to help you stand.   Lighting -- Make sure halls, stairways, and entrances are well-lit. Install a night light in your bathroom or hallway. Make sure there is a light switch at the top and bottom of the staircase. Turn lights on if you get up in the middle of the night. Make sure lamps or light switches are within reach of the bed if you have to get up during the night.   Kitchen -- Install non-skid rubber mats near the sink and stove. Clean spills immediately. Store frequently used utensils, pots, pans between waist and eye level. This helps prevent reaching and bending. Sit when getting things out of lower cupboards.   Living room / [employer] furniture with wide spaces in between, giving enough room to move around. Establish a route through the living room that gives you something to hold onto as you walk.   Stairs -- Make sure treads, rails, and rugs are secure. Install a rail on both sides of the stairs. If stairs are a threat, it might be helpful to arrange most of your activities on the lower level to reduce the number of times you must climb the stairs.   Entrances and doorways -- Install metal handles on the walls adjacent to the doorknobs of all doors to make it more secure as you travel through the doorway.  Tips for maintaining balance   Try using a backpack or fanny pack to hold things rather than carrying them in your hands. Never carry objects in both hands when walking as this interferes with keeping your balance.   Consciously lift your feet off of the ground when walking. Shuffling and dragging of the feet is a common culprit in losing your balance.   When trying to navigate turns, use a "U" technique of facing forward and making a wide turn, rather than pivoting  sharply.   Try to stand with your feet shoulder-length apart. When your feet are close together for any length of time, you increase your risk of losing your balance and falling.   Do one thing at a time. Don't try to walk and accomplish another task, such as reading or looking around. The decrease in your automatic reflexes complicates motor function, so the less distraction, the better.   Do not wear rubber or gripping soled shoes, they might "catch" on the floor and cause tripping.   Move slowly when changing positions. Use deliberate, concentrated movements and, if needed, use a grab bar or walking aid. Count 15 seconds between each movement. For example, when rising from a seated position, wait 15 seconds after standing to begin walking.

## 2021-04-04 ENCOUNTER — Other Ambulatory Visit: Payer: Self-pay | Admitting: Cardiology

## 2021-04-04 LAB — B12 AND FOLATE PANEL
Folate: >20 ng/mL (ref >3.0)
Vitamin B-12: 677 pg/mL (ref 232–1245)

## 2021-04-04 LAB — TSH: TSH: 1.97 u[IU]/mL (ref 0.450–4.500)

## 2021-04-12 ENCOUNTER — Other Ambulatory Visit: Payer: Self-pay | Admitting: Cardiology

## 2021-04-27 ENCOUNTER — Other Ambulatory Visit: Payer: Self-pay

## 2021-04-27 ENCOUNTER — Encounter (HOSPITAL_COMMUNITY): Payer: Self-pay

## 2021-04-27 ENCOUNTER — Observation Stay (HOSPITAL_COMMUNITY): Payer: Medicare PPO

## 2021-04-27 ENCOUNTER — Emergency Department (HOSPITAL_COMMUNITY): Payer: Medicare PPO

## 2021-04-27 ENCOUNTER — Inpatient Hospital Stay (HOSPITAL_COMMUNITY)
Admission: EM | Admit: 2021-04-27 | Discharge: 2021-05-02 | DRG: 073 | Disposition: A | Discharge status: Skilled Nursing Facility | Payer: Medicare PPO | Attending: Internal Medicine | Admitting: Internal Medicine

## 2021-04-27 DIAGNOSIS — I10 Essential (primary) hypertension: Secondary | ICD-10-CM | POA: Diagnosis present

## 2021-04-27 DIAGNOSIS — I447 Left bundle-branch block, unspecified: Secondary | ICD-10-CM | POA: Diagnosis present

## 2021-04-27 DIAGNOSIS — M810 Age-related osteoporosis without current pathological fracture: Secondary | ICD-10-CM | POA: Diagnosis present

## 2021-04-27 DIAGNOSIS — S2231XA Fracture of one rib, right side, initial encounter for closed fracture: Secondary | ICD-10-CM | POA: Diagnosis not present

## 2021-04-27 DIAGNOSIS — M1711 Unilateral primary osteoarthritis, right knee: Secondary | ICD-10-CM | POA: Diagnosis not present

## 2021-04-27 DIAGNOSIS — Z85828 Personal history of other malignant neoplasm of skin: Secondary | ICD-10-CM | POA: Diagnosis not present

## 2021-04-27 DIAGNOSIS — Z23 Encounter for immunization: Secondary | ICD-10-CM | POA: Diagnosis not present

## 2021-04-27 DIAGNOSIS — R52 Pain, unspecified: Secondary | ICD-10-CM

## 2021-04-27 DIAGNOSIS — Z043 Encounter for examination and observation following other accident: Secondary | ICD-10-CM | POA: Diagnosis not present

## 2021-04-27 DIAGNOSIS — S299XXA Unspecified injury of thorax, initial encounter: Secondary | ICD-10-CM | POA: Diagnosis not present

## 2021-04-27 DIAGNOSIS — M5124 Other intervertebral disc displacement, thoracic region: Secondary | ICD-10-CM | POA: Diagnosis not present

## 2021-04-27 DIAGNOSIS — Z8249 Family history of ischemic heart disease and other diseases of the circulatory system: Secondary | ICD-10-CM | POA: Diagnosis not present

## 2021-04-27 DIAGNOSIS — R29818 Other symptoms and signs involving the nervous system: Secondary | ICD-10-CM | POA: Diagnosis not present

## 2021-04-27 DIAGNOSIS — R410 Disorientation, unspecified: Secondary | ICD-10-CM | POA: Diagnosis not present

## 2021-04-27 DIAGNOSIS — R748 Abnormal levels of other serum enzymes: Secondary | ICD-10-CM | POA: Diagnosis present

## 2021-04-27 DIAGNOSIS — I712 Thoracic aortic aneurysm, without rupture, unspecified: Secondary | ICD-10-CM | POA: Diagnosis not present

## 2021-04-27 DIAGNOSIS — I700 Atherosclerosis of aorta: Secondary | ICD-10-CM | POA: Diagnosis not present

## 2021-04-27 DIAGNOSIS — F0280 Dementia in other diseases classified elsewhere without behavioral disturbance: Secondary | ICD-10-CM | POA: Diagnosis present

## 2021-04-27 DIAGNOSIS — Z20822 Contact with and (suspected) exposure to covid-19: Secondary | ICD-10-CM | POA: Diagnosis present

## 2021-04-27 DIAGNOSIS — R29898 Other symptoms and signs involving the musculoskeletal system: Secondary | ICD-10-CM | POA: Diagnosis not present

## 2021-04-27 DIAGNOSIS — S3993XA Unspecified injury of pelvis, initial encounter: Secondary | ICD-10-CM | POA: Diagnosis not present

## 2021-04-27 DIAGNOSIS — Z8261 Family history of arthritis: Secondary | ICD-10-CM

## 2021-04-27 DIAGNOSIS — Z96641 Presence of right artificial hip joint: Secondary | ICD-10-CM | POA: Diagnosis present

## 2021-04-27 DIAGNOSIS — J9601 Acute respiratory failure with hypoxia: Secondary | ICD-10-CM | POA: Diagnosis present

## 2021-04-27 DIAGNOSIS — Z79899 Other long term (current) drug therapy: Secondary | ICD-10-CM | POA: Diagnosis not present

## 2021-04-27 DIAGNOSIS — J9811 Atelectasis: Secondary | ICD-10-CM | POA: Diagnosis not present

## 2021-04-27 DIAGNOSIS — M1990 Unspecified osteoarthritis, unspecified site: Secondary | ICD-10-CM | POA: Diagnosis present

## 2021-04-27 DIAGNOSIS — K219 Gastro-esophageal reflux disease without esophagitis: Secondary | ICD-10-CM | POA: Diagnosis present

## 2021-04-27 DIAGNOSIS — S4991XA Unspecified injury of right shoulder and upper arm, initial encounter: Secondary | ICD-10-CM | POA: Diagnosis not present

## 2021-04-27 DIAGNOSIS — J189 Pneumonia, unspecified organism: Secondary | ICD-10-CM

## 2021-04-27 DIAGNOSIS — S0990XA Unspecified injury of head, initial encounter: Secondary | ICD-10-CM | POA: Diagnosis not present

## 2021-04-27 DIAGNOSIS — F0390 Unspecified dementia without behavioral disturbance: Secondary | ICD-10-CM

## 2021-04-27 DIAGNOSIS — R0689 Other abnormalities of breathing: Secondary | ICD-10-CM | POA: Diagnosis not present

## 2021-04-27 DIAGNOSIS — M542 Cervicalgia: Secondary | ICD-10-CM | POA: Diagnosis not present

## 2021-04-27 DIAGNOSIS — G309 Alzheimer's disease, unspecified: Secondary | ICD-10-CM | POA: Diagnosis not present

## 2021-04-27 DIAGNOSIS — Z7982 Long term (current) use of aspirin: Secondary | ICD-10-CM | POA: Diagnosis not present

## 2021-04-27 DIAGNOSIS — Z7401 Bed confinement status: Secondary | ICD-10-CM | POA: Diagnosis not present

## 2021-04-27 DIAGNOSIS — G4733 Obstructive sleep apnea (adult) (pediatric): Secondary | ICD-10-CM | POA: Diagnosis present

## 2021-04-27 DIAGNOSIS — G5722 Lesion of femoral nerve, left lower limb: Principal | ICD-10-CM | POA: Diagnosis present

## 2021-04-27 DIAGNOSIS — T796XXA Traumatic ischemia of muscle, initial encounter: Secondary | ICD-10-CM | POA: Diagnosis present

## 2021-04-27 DIAGNOSIS — Z888 Allergy status to other drugs, medicaments and biological substances status: Secondary | ICD-10-CM

## 2021-04-27 DIAGNOSIS — I7111 Aneurysm of the ascending aorta, ruptured: Secondary | ICD-10-CM | POA: Diagnosis not present

## 2021-04-27 DIAGNOSIS — G629 Polyneuropathy, unspecified: Secondary | ICD-10-CM | POA: Diagnosis present

## 2021-04-27 DIAGNOSIS — W19XXXA Unspecified fall, initial encounter: Secondary | ICD-10-CM

## 2021-04-27 DIAGNOSIS — M1612 Unilateral primary osteoarthritis, left hip: Secondary | ICD-10-CM | POA: Diagnosis present

## 2021-04-27 DIAGNOSIS — R55 Syncope and collapse: Secondary | ICD-10-CM | POA: Diagnosis not present

## 2021-04-27 DIAGNOSIS — K5730 Diverticulosis of large intestine without perforation or abscess without bleeding: Secondary | ICD-10-CM | POA: Diagnosis not present

## 2021-04-27 DIAGNOSIS — R0902 Hypoxemia: Secondary | ICD-10-CM | POA: Diagnosis not present

## 2021-04-27 DIAGNOSIS — T1490XA Injury, unspecified, initial encounter: Secondary | ICD-10-CM | POA: Diagnosis present

## 2021-04-27 DIAGNOSIS — M79604 Pain in right leg: Secondary | ICD-10-CM | POA: Diagnosis not present

## 2021-04-27 DIAGNOSIS — M5136 Other intervertebral disc degeneration, lumbar region: Secondary | ICD-10-CM | POA: Diagnosis not present

## 2021-04-27 DIAGNOSIS — I7121 Aneurysm of the ascending aorta, without rupture: Secondary | ICD-10-CM | POA: Diagnosis present

## 2021-04-27 DIAGNOSIS — M545 Low back pain, unspecified: Secondary | ICD-10-CM | POA: Diagnosis not present

## 2021-04-27 DIAGNOSIS — Z823 Family history of stroke: Secondary | ICD-10-CM

## 2021-04-27 DIAGNOSIS — I6782 Cerebral ischemia: Secondary | ICD-10-CM | POA: Diagnosis not present

## 2021-04-27 DIAGNOSIS — S199XXA Unspecified injury of neck, initial encounter: Secondary | ICD-10-CM | POA: Diagnosis not present

## 2021-04-27 LAB — LACTIC ACID, PLASMA
Lactic Acid, Venous: 1.2 mmol/L (ref 0.5–1.9)
Lactic Acid, Venous: 1.1 mmol/L (ref 0.5–1.9)

## 2021-04-27 LAB — CBC
HCT: 36.5 % (ref 36.0–46.0)
Hemoglobin: 11.9 g/dL — ABNORMAL LOW (ref 12.0–15.0)
MCH: 32.8 pg (ref 26.0–34.0)
MCHC: 32.6 g/dL (ref 30.0–36.0)
MCV: 100.6 fL — ABNORMAL HIGH (ref 80.0–100.0)
Platelets: 223 10*3/uL (ref 150–400)
RBC: 3.63 MIL/uL — ABNORMAL LOW (ref 3.87–5.11)
RDW: 15.6 % — ABNORMAL HIGH (ref 11.5–15.5)
WBC: 10.4 10*3/uL (ref 4.0–10.5)
nRBC: 0.4 % — ABNORMAL HIGH (ref 0.0–0.2)

## 2021-04-27 LAB — CBC WITH DIFFERENTIAL/PLATELET
Abs Immature Granulocytes: 0.11 10*3/uL — ABNORMAL HIGH (ref 0.00–0.07)
Basophils Absolute: 0.1 10*3/uL (ref 0.0–0.1)
Basophils Relative: 0 %
Eosinophils Absolute: 0 10*3/uL (ref 0.0–0.5)
Eosinophils Relative: 0 %
HCT: 40.8 % (ref 36.0–46.0)
Hemoglobin: 13.6 g/dL (ref 12.0–15.0)
Immature Granulocytes: 1 %
Lymphocytes Relative: 8 %
Lymphs Abs: 1.2 10*3/uL (ref 0.7–4.0)
MCH: 32.7 pg (ref 26.0–34.0)
MCHC: 33.3 g/dL (ref 30.0–36.0)
MCV: 98.1 fL (ref 80.0–100.0)
Monocytes Absolute: 1.4 10*3/uL — ABNORMAL HIGH (ref 0.1–1.0)
Monocytes Relative: 9 %
Neutro Abs: 12.9 10*3/uL — ABNORMAL HIGH (ref 1.7–7.7)
Neutrophils Relative %: 82 %
Platelets: 268 10*3/uL (ref 150–400)
RBC: 4.16 MIL/uL (ref 3.87–5.11)
RDW: 15.7 % — ABNORMAL HIGH (ref 11.5–15.5)
WBC: 15.6 10*3/uL — ABNORMAL HIGH (ref 4.0–10.5)
nRBC: 0.3 % — ABNORMAL HIGH (ref 0.0–0.2)

## 2021-04-27 LAB — COMPREHENSIVE METABOLIC PANEL
ALT: 26 U/L (ref 0–44)
AST: 40 U/L (ref 15–41)
Albumin: 4.2 g/dL (ref 3.5–5.0)
Alkaline Phosphatase: 64 U/L (ref 38–126)
Anion gap: 10 (ref 5–15)
BUN: 39 mg/dL — ABNORMAL HIGH (ref 8–23)
CO2: 27 mmol/L (ref 22–32)
Calcium: 9.7 mg/dL (ref 8.9–10.3)
Chloride: 99 mmol/L (ref 98–111)
Creatinine, Ser: 0.66 mg/dL (ref 0.44–1.00)
GFR, Estimated: >60 mL/min (ref >60)
Glucose, Bld: 119 mg/dL — ABNORMAL HIGH (ref 70–99)
Potassium: 3.5 mmol/L (ref 3.5–5.1)
Sodium: 136 mmol/L (ref 135–145)
Total Bilirubin: 0.8 mg/dL (ref 0.3–1.2)
Total Protein: 7.3 g/dL (ref 6.5–8.1)

## 2021-04-27 LAB — BLOOD GAS, VENOUS
Acid-Base Excess: 2.6 mmol/L — ABNORMAL HIGH (ref 0.0–2.0)
Bicarbonate: 30 mmol/L — ABNORMAL HIGH (ref 20.0–28.0)
O2 Saturation: 41.4 %
Patient temperature: 98.6
pCO2, Ven: 61.5 mmHg — ABNORMAL HIGH (ref 44.0–60.0)
pH, Ven: 7.309 (ref 7.250–7.430)
pO2, Ven: 30.2 mmHg — CL (ref 32.0–45.0)

## 2021-04-27 LAB — CREATININE, SERUM
Creatinine, Ser: 0.58 mg/dL (ref 0.44–1.00)
GFR, Estimated: >60 mL/min (ref >60)

## 2021-04-27 LAB — RESP PANEL BY RT-PCR (FLU A&B, COVID) ARPGX2
Influenza A by PCR: NEGATIVE
Influenza B by PCR: NEGATIVE
SARS Coronavirus 2 by RT PCR: NEGATIVE

## 2021-04-27 LAB — URINALYSIS, ROUTINE W REFLEX MICROSCOPIC
Bacteria, UA: NONE SEEN
Bilirubin Urine: NEGATIVE
Glucose, UA: NEGATIVE mg/dL
Hgb urine dipstick: NEGATIVE
Ketones, ur: 20 mg/dL — AB
Leukocytes,Ua: NEGATIVE
Nitrite: NEGATIVE
Protein, ur: >=300 mg/dL — AB
Specific Gravity, Urine: 1.027 (ref 1.005–1.030)
pH: 5 (ref 5.0–8.0)

## 2021-04-27 LAB — TROPONIN I (HIGH SENSITIVITY)
Troponin I (High Sensitivity): 17 ng/L (ref <18)
Troponin I (High Sensitivity): 26 ng/L — ABNORMAL HIGH (ref <18)

## 2021-04-27 LAB — PROCALCITONIN: Procalcitonin: <0.1 ng/mL

## 2021-04-27 LAB — CK: Total CK: 1211 U/L — ABNORMAL HIGH (ref 38–234)

## 2021-04-27 LAB — CBG MONITORING, ED: Glucose-Capillary: 112 mg/dL — ABNORMAL HIGH (ref 70–99)

## 2021-04-27 LAB — D-DIMER, QUANTITATIVE: D-Dimer, Quant: 1.6 ug/mL-FEU — ABNORMAL HIGH (ref 0.00–0.50)

## 2021-04-27 MED ORDER — SODIUM CHLORIDE 0.9 % IV SOLN
500.0000 mg | Freq: Once | INTRAVENOUS | Status: AC
  Administered 2021-04-27: 500 mg via INTRAVENOUS
  Filled 2021-04-27: qty 500

## 2021-04-27 MED ORDER — LACTATED RINGERS IV SOLN: INTRAVENOUS | Status: AC

## 2021-04-27 MED ORDER — ACETAMINOPHEN 325 MG PO TABS
650.0000 mg | ORAL_TABLET | Freq: Four times a day (QID) | ORAL | Status: DC | PRN
  Administered 2021-04-28 – 2021-05-02 (×6): 650 mg via ORAL
  Filled 2021-04-27 (×6): qty 2

## 2021-04-27 MED ORDER — IOHEXOL 350 MG/ML SOLN
75.0000 mL | Freq: Once | INTRAVENOUS | Status: AC | PRN
  Administered 2021-04-27: 75 mL via INTRAVENOUS

## 2021-04-27 MED ORDER — CARVEDILOL 12.5 MG PO TABS
12.5000 mg | ORAL_TABLET | Freq: Two times a day (BID) | ORAL | Status: DC
  Administered 2021-04-28 – 2021-05-02 (×9): 12.5 mg via ORAL
  Filled 2021-04-27 (×9): qty 1

## 2021-04-27 MED ORDER — SODIUM CHLORIDE 0.9 % IV SOLN
2.0000 g | INTRAVENOUS | Status: DC
  Filled 2021-04-27: qty 20

## 2021-04-27 MED ORDER — ENOXAPARIN SODIUM 40 MG/0.4ML IJ SOSY
40.0000 mg | PREFILLED_SYRINGE | INTRAMUSCULAR | Status: DC
  Administered 2021-04-27 – 2021-05-01 (×5): 40 mg via SUBCUTANEOUS
  Filled 2021-04-27 (×5): qty 0.4

## 2021-04-27 MED ORDER — LACTATED RINGERS IV BOLUS
1000.0000 mL | Freq: Once | INTRAVENOUS | Status: AC
  Administered 2021-04-27: 1000 mL via INTRAVENOUS

## 2021-04-27 MED ORDER — SODIUM CHLORIDE 0.9 % IV SOLN
2.0000 g | Freq: Once | INTRAVENOUS | Status: AC
  Administered 2021-04-27: 2 g via INTRAVENOUS
  Filled 2021-04-27: qty 20

## 2021-04-27 MED ORDER — ACETAMINOPHEN 650 MG RE SUPP: 650.0000 mg | Freq: Four times a day (QID) | RECTAL | Status: DC | PRN

## 2021-04-27 MED ORDER — SODIUM CHLORIDE 0.9 % IV SOLN
500.0000 mg | INTRAVENOUS | Status: DC
  Filled 2021-04-27: qty 500

## 2021-04-27 NOTE — ED Notes (Signed)
Oxygen will drop in the 80s if patient is sleeping and scoots herself down in the bed. When patient awake and sitting up in the bed oxygen increases back to 94% on room air.
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
## 2021-04-27 NOTE — Progress Notes (Signed)
Airborne/Contact discontinued due to negative Covid, Influenza A&B.
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
## 2021-04-27 NOTE — ED Triage Notes (Signed)
"  Found between toilet and wall, not sure how long she was there, not sure if she hit her head or what injuries, she doesn't remember what happened before the fall, nursing facility staff unsure what caused fall" per EMS "She is complaining of right upper arm and neck pain" per EMS

## 2021-04-28 ENCOUNTER — Inpatient Hospital Stay (HOSPITAL_COMMUNITY): Payer: Medicare PPO

## 2021-04-28 DIAGNOSIS — Z23 Encounter for immunization: Secondary | ICD-10-CM | POA: Diagnosis present

## 2021-04-28 DIAGNOSIS — J189 Pneumonia, unspecified organism: Secondary | ICD-10-CM | POA: Diagnosis not present

## 2021-04-28 DIAGNOSIS — R748 Abnormal levels of other serum enzymes: Secondary | ICD-10-CM | POA: Diagnosis present

## 2021-04-28 DIAGNOSIS — F0390 Unspecified dementia without behavioral disturbance: Secondary | ICD-10-CM | POA: Diagnosis not present

## 2021-04-28 DIAGNOSIS — M1990 Unspecified osteoarthritis, unspecified site: Secondary | ICD-10-CM | POA: Diagnosis present

## 2021-04-28 DIAGNOSIS — I10 Essential (primary) hypertension: Secondary | ICD-10-CM | POA: Diagnosis present

## 2021-04-28 DIAGNOSIS — Z7982 Long term (current) use of aspirin: Secondary | ICD-10-CM | POA: Diagnosis not present

## 2021-04-28 DIAGNOSIS — Z85828 Personal history of other malignant neoplasm of skin: Secondary | ICD-10-CM | POA: Diagnosis not present

## 2021-04-28 DIAGNOSIS — Z823 Family history of stroke: Secondary | ICD-10-CM | POA: Diagnosis not present

## 2021-04-28 DIAGNOSIS — J9601 Acute respiratory failure with hypoxia: Secondary | ICD-10-CM | POA: Diagnosis present

## 2021-04-28 DIAGNOSIS — Z96641 Presence of right artificial hip joint: Secondary | ICD-10-CM | POA: Diagnosis present

## 2021-04-28 DIAGNOSIS — R29898 Other symptoms and signs involving the musculoskeletal system: Secondary | ICD-10-CM | POA: Diagnosis not present

## 2021-04-28 DIAGNOSIS — T796XXA Traumatic ischemia of muscle, initial encounter: Secondary | ICD-10-CM | POA: Diagnosis present

## 2021-04-28 DIAGNOSIS — I447 Left bundle-branch block, unspecified: Secondary | ICD-10-CM | POA: Diagnosis present

## 2021-04-28 DIAGNOSIS — F0280 Dementia in other diseases classified elsewhere without behavioral disturbance: Secondary | ICD-10-CM | POA: Diagnosis present

## 2021-04-28 DIAGNOSIS — K219 Gastro-esophageal reflux disease without esophagitis: Secondary | ICD-10-CM | POA: Diagnosis present

## 2021-04-28 DIAGNOSIS — M1612 Unilateral primary osteoarthritis, left hip: Secondary | ICD-10-CM | POA: Diagnosis present

## 2021-04-28 DIAGNOSIS — G4733 Obstructive sleep apnea (adult) (pediatric): Secondary | ICD-10-CM | POA: Diagnosis present

## 2021-04-28 DIAGNOSIS — G629 Polyneuropathy, unspecified: Secondary | ICD-10-CM | POA: Diagnosis present

## 2021-04-28 DIAGNOSIS — G5722 Lesion of femoral nerve, left lower limb: Secondary | ICD-10-CM | POA: Diagnosis present

## 2021-04-28 DIAGNOSIS — Z8261 Family history of arthritis: Secondary | ICD-10-CM | POA: Diagnosis not present

## 2021-04-28 DIAGNOSIS — Z20822 Contact with and (suspected) exposure to covid-19: Secondary | ICD-10-CM | POA: Diagnosis present

## 2021-04-28 DIAGNOSIS — M810 Age-related osteoporosis without current pathological fracture: Secondary | ICD-10-CM | POA: Diagnosis present

## 2021-04-28 DIAGNOSIS — Z79899 Other long term (current) drug therapy: Secondary | ICD-10-CM | POA: Diagnosis not present

## 2021-04-28 DIAGNOSIS — W19XXXA Unspecified fall, initial encounter: Secondary | ICD-10-CM | POA: Diagnosis not present

## 2021-04-28 DIAGNOSIS — G309 Alzheimer's disease, unspecified: Secondary | ICD-10-CM | POA: Diagnosis present

## 2021-04-28 DIAGNOSIS — S2231XA Fracture of one rib, right side, initial encounter for closed fracture: Secondary | ICD-10-CM | POA: Diagnosis present

## 2021-04-28 DIAGNOSIS — I7121 Aneurysm of the ascending aorta, without rupture: Secondary | ICD-10-CM | POA: Diagnosis present

## 2021-04-28 DIAGNOSIS — T1490XA Injury, unspecified, initial encounter: Secondary | ICD-10-CM | POA: Diagnosis present

## 2021-04-28 DIAGNOSIS — Z8249 Family history of ischemic heart disease and other diseases of the circulatory system: Secondary | ICD-10-CM | POA: Diagnosis not present

## 2021-04-28 LAB — COMPREHENSIVE METABOLIC PANEL
ALT: 21 U/L (ref 0–44)
AST: 27 U/L (ref 15–41)
Albumin: 3.2 g/dL — ABNORMAL LOW (ref 3.5–5.0)
Alkaline Phosphatase: 46 U/L (ref 38–126)
Anion gap: 4 — ABNORMAL LOW (ref 5–15)
BUN: 32 mg/dL — ABNORMAL HIGH (ref 8–23)
CO2: 30 mmol/L (ref 22–32)
Calcium: 8.9 mg/dL (ref 8.9–10.3)
Chloride: 104 mmol/L (ref 98–111)
Creatinine, Ser: 0.5 mg/dL (ref 0.44–1.00)
GFR, Estimated: >60 mL/min (ref >60)
Glucose, Bld: 97 mg/dL (ref 70–99)
Potassium: 3.7 mmol/L (ref 3.5–5.1)
Sodium: 138 mmol/L (ref 135–145)
Total Bilirubin: 0.6 mg/dL (ref 0.3–1.2)
Total Protein: 5.6 g/dL — ABNORMAL LOW (ref 6.5–8.1)

## 2021-04-28 LAB — CBC
HCT: 34.9 % — ABNORMAL LOW (ref 36.0–46.0)
Hemoglobin: 11.1 g/dL — ABNORMAL LOW (ref 12.0–15.0)
MCH: 32.3 pg (ref 26.0–34.0)
MCHC: 31.8 g/dL (ref 30.0–36.0)
MCV: 101.5 fL — ABNORMAL HIGH (ref 80.0–100.0)
Platelets: 214 10*3/uL (ref 150–400)
RBC: 3.44 MIL/uL — ABNORMAL LOW (ref 3.87–5.11)
RDW: 15.8 % — ABNORMAL HIGH (ref 11.5–15.5)
WBC: 7.2 10*3/uL (ref 4.0–10.5)
nRBC: 0.3 % — ABNORMAL HIGH (ref 0.0–0.2)

## 2021-04-28 LAB — SEDIMENTATION RATE: Sed Rate: 33 mm/hr — ABNORMAL HIGH (ref 0–22)

## 2021-04-28 LAB — HIV ANTIBODY (ROUTINE TESTING W REFLEX): HIV Screen 4th Generation wRfx: NONREACTIVE

## 2021-04-28 LAB — STREP PNEUMONIAE URINARY ANTIGEN: Strep Pneumo Urinary Antigen: NEGATIVE

## 2021-04-28 LAB — C-REACTIVE PROTEIN: CRP: 1.8 mg/dL — ABNORMAL HIGH (ref <1.0)

## 2021-04-28 LAB — CK: Total CK: 514 U/L — ABNORMAL HIGH (ref 38–234)

## 2021-04-28 MED ORDER — COVID-19MRNA BIVAL VACC PFIZER 30 MCG/0.3ML IM SUSP
0.3000 mL | Freq: Once | INTRAMUSCULAR | Status: AC
  Administered 2021-04-29: 0.3 mL via INTRAMUSCULAR
  Filled 2021-04-28: qty 0.3

## 2021-04-28 NOTE — Progress Notes (Addendum)
PROGRESS NOTE    [name]  WIO:[id number] DOB: 05/20/1941 DOA: 04/27/2021 PCP: Orpah Melter, MD    No chief complaint on file.   Brief Narrative:  [name] is [name] 80 y.o. female with medical history significant of dementia, hypertension, skin cancer, and multiple other medical issues presenting after being found down in the bathroom.  Assessment & Plan:   Active Problems:   Community acquired pneumonia  Bilateral Lower Extremity Weakness  MRI pelvis without acute fx - severe L hip OA, intramuscular edema within inferior aspects of gluteus maximus muscles and origins of gluteus minimus and medius muscles - myositis vs low grade strain ESR, CRP - for concern for myositis on imaging (possibly related to being found down?) Clonus on R Follow MRI lumbar spine and brain -> addendum: L spine imaging and brain imaging not clearly revealing, hyperreflexic to RLE (clonus, patellar reflex) -> T spine imaging ordered, neurology going to see her in the AM  Found Down  Concern for Mechanical Fall  Elevated CK Found in bathroom between wall and toilet Son notes, she's been having episodes where she's been found down and this was part of reason for her going to [facility] (?she chooses to sit or lie down, rather than falls? - never evidence of trauma before - not sure what this was) Unwitnessed event, unclear if fall -> 8th rib fx suggests trauma/fall CT head/neck - no acute intracranial abnormality, no acute fx or static subluxation of cervical spine Hip plain films without acute fx or dislocation Follow MRI pelvis - as noted above, no fx, severe L hip OA, femoral head flattening/articular surface collapse, arthritis related joint effusion - symmetric intramuscular edema within inferior aspects of gluteus maximus muscles and the origins of gluteus minimus and medius muscles - myositis vs low grade muscle strain Plain films of RLE - femur, knee, tib fib unremarkable  MRI for LE  weakness as noted above CK improving, no RBC's on UA Needs therapy evals pending w/u    Left Lower Lobe Airspace Opacities  Acute Hypoxic Respiratory Failure She's desatting, especially when asleep ? OSA, will need outpatient sleep study CT chest without consolidation, effusion, edema Stop abx, no evidence pneumonia on CT scan Sputum, urine strep, urine legionella Negative COVID and influenza Wean O2 as tolerated  Right 8th Rib Fracture Pain control Follow  IS Could contribute to above with splinting  Ascending Thoracic Aneurysm Needs semi annual imaging and CT surgery referral   Dementia Delirium precautions   HTN Coreg Hold amlodipine for now   Hold Crestor with elevated CK   Pending EKG   DVT prophylaxis: lovenox Code Status: full Family Communication: son 10/23 Disposition:   Status is: inaptient  The patient will require care spanning > 2 midnights and should be moved to inpatient because: continued workup of LE weakness, therapy evals      Consultants:  none  Procedures:  none  Antimicrobials: Anti-infectives (From admission, onward)    Start     Dose/Rate Route Frequency Ordered Stop   04/28/21 1400  cefTRIAXone (ROCEPHIN) 2 g in sodium chloride 0.9 % 100 mL IVPB  Status:  Discontinued        2 g 200 mL/hr over 30 Minutes Intravenous Every 24 hours 04/27/21 1929 04/28/21 0754   04/28/21 1400  azithromycin (ZITHROMAX) 500 mg in sodium chloride 0.9 % 250 mL IVPB  Status:  Discontinued        500 mg 250 mL/hr over 60 Minutes Intravenous Every 24  hours 04/27/21 1929 04/28/21 0754   04/27/21 1600  cefTRIAXone (ROCEPHIN) 2 g in sodium chloride 0.9 % 100 mL IVPB        2 g 200 mL/hr over 30 Minutes Intravenous  Once 04/27/21 1549 04/27/21 1730   04/27/21 1600  azithromycin (ZITHROMAX) 500 mg in sodium chloride 0.9 % 250 mL IVPB        500 mg 250 mL/hr over 60 Minutes Intravenous  Once 04/27/21 1549 04/27/21 1809          Subjective: Denies  issues with bowel/bladder incontinence Reports numbness and pain down left leg for about [name] weak? Denies concerns  Objective: Vitals:   04/27/21 2032 04/28/21 0211 04/28/21 0505 04/28/21 0854  BP: (!) 143/58 (!) 159/80 (!) 145/76 (!) 153/74  Pulse: 87 90 86 87  Resp: (!) 21 19 18 18   Temp: 98 F (36.7 C) 98.7 F (37.1 C) 98.6 F (37 C) 99.3 F (37.4 C)  TempSrc: Oral Oral Oral Oral  SpO2: 100% 98% 96% 97%  Weight:      Height:        Intake/Output Summary (Last 24 hours) at 04/28/2021 1010 Last data filed at 04/28/2021 0655 Gross per 24 hour  Intake 1168.75 ml  Output 200 ml  Net 968.75 ml   Filed Weights   04/27/21 1311 04/27/21 1736  Weight: 81.6 kg 82.7 kg    Examination:  General exam: Appears calm and comfortable  Respiratory system: Clear to auscultation. Respiratory effort normal. Cardiovascular system: S1 & S2 heard, RRR. No JVD, murmurs, rubs, gallops or clicks. No pedal edema. Gastrointestinal system: Abdomen is nondistended, soft and nontender.  Central nervous system: Alert and oriented x2 (not month or situation, couldn't explain why she was here).  Upper extremities with symmetric strength.  Bilateral LE difficulty clearing heels from bed.  Equivocal babinski on R, clonus noted on R. Extremities:  no LEE.  Stable pelvis, no significant TTP to upper extremities.  Mild discomfort to L hip.  Skin: No rashes, lesions or ulcers Psychiatry: Judgement and insight appear normal. Mood & affect appropriate.     Data Reviewed: I have personally reviewed following labs and imaging studies  CBC: Recent Labs  Lab 04/27/21 1416 04/27/21 1818 04/28/21 0341  WBC 15.6* 10.4 7.2  NEUTROABS 12.9*  --   --   HGB 13.6 11.9* 11.1*  HCT 40.8 36.5 34.9*  MCV 98.1 100.6* 101.5*  PLT 268 223 935    Basic Metabolic Panel: Recent Labs  Lab 04/27/21 1416 04/27/21 1818 04/28/21 0341  NA 136  --  138  K 3.5  --  3.7  CL 99  --  104  CO2 27  --  30  GLUCOSE 119*   --  97  BUN 39*  --  32*  CREATININE 0.66 0.58 0.50  CALCIUM 9.7  --  8.9    GFR: Estimated Creatinine Clearance: 54.4 mL/min (by C-G formula based on SCr of 0.5 mg/dL).  Liver Function Tests: Recent Labs  Lab 04/27/21 1416 04/28/21 0341  AST 40 27  ALT 26 21  ALKPHOS 64 46  BILITOT 0.8 0.6  PROT 7.3 5.6*  ALBUMIN 4.2 3.2*    CBG: Recent Labs  Lab 04/27/21 1357  GLUCAP 112*     Recent Results (from the past 240 hour(s))  Resp Panel by RT-PCR (Flu Javarious Elsayed&B, Covid) Nasopharyngeal Swab     Status: None   Collection Time: 04/27/21  4:41 PM   Specimen: Nasopharyngeal Swab; Nasopharyngeal(NP) swabs  in vial transport medium  Result Value Ref Range Status   SARS Coronavirus 2 by RT PCR NEGATIVE NEGATIVE Final    Comment: (NOTE) SARS-CoV-2 target nucleic acids are NOT DETECTED.  The SARS-CoV-2 RNA is generally detectable in upper respiratory specimens during the acute phase of infection. The lowest concentration of SARS-CoV-2 viral copies this assay can detect is 138 copies/mL. Lauryl Seyer negative result does not preclude SARS-Cov-2 infection and should not be used as the sole basis for treatment or other patient management decisions. Tristyn Demarest negative result may occur with  improper specimen collection/handling, submission of specimen other than nasopharyngeal swab, presence of viral mutation(s) within the areas targeted by this assay, and inadequate number of viral copies(<138 copies/mL). Grayton Lobo negative result must be combined with clinical observations, patient history, and epidemiological information. The expected result is Negative.  Fact Sheet for Patients:  EntrepreneurPulse.com.au  Fact Sheet for Healthcare Providers:  IncredibleEmployment.be  This test is no t yet approved or cleared by the [place] FDA and  has been authorized for detection and/or diagnosis of SARS-CoV-2 by FDA under an Emergency Use Authorization (EUA). This EUA will remain   in effect (meaning this test can be used) for the duration of the COVID-19 declaration under Section 564(b)(1) of the Act, 21 U.S.C.section 360bbb-3(b)(1), unless the authorization is terminated  or revoked sooner.       Influenza Jaonna Word by PCR NEGATIVE NEGATIVE Final   Influenza B by PCR NEGATIVE NEGATIVE Final    Comment: (NOTE) The Xpert Xpress SARS-CoV-2/FLU/RSV plus assay is intended as an aid in the diagnosis of influenza from Nasopharyngeal swab specimens and should not be used as Harla Mensch sole basis for treatment. Nasal washings and aspirates are unacceptable for Xpert Xpress SARS-CoV-2/FLU/RSV testing.  Fact Sheet for Patients: EntrepreneurPulse.com.au  Fact Sheet for Healthcare Providers: IncredibleEmployment.be  This test is not yet approved or cleared by the [place] FDA and has been authorized for detection and/or diagnosis of SARS-CoV-2 by FDA under an Emergency Use Authorization (EUA). This EUA will remain in effect (meaning this test can be used) for the duration of the COVID-19 declaration under Section 564(b)(1) of the Act, 21 U.S.C. section 360bbb-3(b)(1), unless the authorization is terminated or revoked.  Performed at [facility], [place] [address] [place], [place] [address]          Radiology Studies: DG Chest 2 View  Result Date: 04/27/2021 CLINICAL DATA:  Fall.  History of ascending aortic aneurysm. EXAM: CHEST - 2 VIEW COMPARISON:  Chest radiograph 12/06/2020; CT chest 06/11/2020 FINDINGS: New retrocardiac patchy airspace opacities, best appreciated on lateral view overlying the distal thoracic spine. Trace left pleural fluid. No pneumothorax. Heart is normal in size. Stable cardiomediastinal silhouette reflecting known ascending aortic aneurysm. No acute osseous abnormality. Severe osteoarthritis of the glenohumeral joints. IMPRESSION: 1. New retrocardiac left lower lobe airspace opacities, concerning  for pneumonia. 2. Trace left pleural fluid. 3. Stable ascending aortic aneurysm. Aortic aneurysm NOS (ICD10-I71.9). 4. No acute osseous abnormality status post fall. Electronically Signed   By: Ileana Roup M.D.   On: 04/27/2021 14:24   DG Knee 1-2 Views Right  Result Date: 04/27/2021 CLINICAL DATA:  Found down in the bathroom today.  Dementia. EXAM: RIGHT KNEE - 1-2 VIEW COMPARISON:  None. FINDINGS: No acute fracture or subluxation. Mild degenerative changes of the LATERAL and patellofemoral compartments. No joint effusion. IMPRESSION: No evidence for acute  abnormality. Electronically Signed   By: Nolon Nations M.D.   On: 04/27/2021 20:07  DG Tibia/Fibula Right  Result Date: 04/27/2021 CLINICAL DATA:  Found down in the bathroom.  RIGHT leg pain. EXAM: RIGHT TIBIA AND FIBULA - 2 VIEW COMPARISON:  None. FINDINGS: There is no evidence of fracture or other focal bone lesions. Soft tissues are unremarkable. IMPRESSION: Negative. Electronically Signed   By: Nolon Nations M.D.   On: 04/27/2021 20:08   CT HEAD WO CONTRAST (5MM)  Result Date: 04/27/2021 CLINICAL DATA:  Head trauma, minor (Age >= 65y) EXAM: CT HEAD WITHOUT CONTRAST CT CERVICAL SPINE WITHOUT CONTRAST TECHNIQUE: Multidetector CT imaging of the head and cervical spine was performed following the standard protocol without intravenous contrast. Multiplanar CT image reconstructions of the cervical spine were also generated. COMPARISON:  January 12, 2019 FINDINGS: CT HEAD FINDINGS Brain: No evidence of acute infarction, hemorrhage, hydrocephalus, extra-axial collection or mass lesion/mass effect. Periventricular white matter hypodensities consistent with sequela of chronic microvascular ischemic disease. Global parenchymal volume loss Vascular: Vascular calcifications. Skull: Normal. Negative for fracture or focal lesion. Sinuses/Orbits: No acute finding. Other: None. CT CERVICAL SPINE FINDINGS Alignment: Normal. Skull base and vertebrae: No  acute fracture. No primary bone lesion or focal pathologic process. Soft tissues and spinal canal: No prevertebral fluid or swelling. No visible canal hematoma. Disc levels: Mild intervertebral disc space height loss at C4-5. Bilateral facet arthropathy without high-grade canal narrowing. Upper chest: Negative. Other: None IMPRESSION: 1.  No acute intracranial abnormality. 2.  No acute fracture or static subluxation of the cervical spine. Electronically Signed   By: Valentino Saxon M.D.   On: 04/27/2021 14:01   CT Angio Chest Pulmonary Embolism (PE) W or WO Contrast  Result Date: 04/27/2021 CLINICAL DATA:  Found down.  Pulmonary embolus suspected. EXAM: CT ANGIOGRAPHY CHEST WITH CONTRAST TECHNIQUE: Multidetector CT imaging of the chest was performed using the standard protocol during bolus administration of intravenous contrast. Multiplanar CT image reconstructions and MIPs were obtained to evaluate the vascular anatomy. CONTRAST:  46m OMNIPAQUE IOHEXOL 350 MG/ML SOLN COMPARISON:  Two-view chest today. FINDINGS: Cardiovascular: Heart size is normal. There are coronary artery calcifications. There is minimal atherosclerotic calcification of the thoracic aorta. The ascending aorta is aneurysmal, measuring 4.9 centimeters. Pulmonary arteries are well opacified and there is no evidence for acute pulmonary embolus. Mediastinum/Nodes: The visualized portion of the thyroid gland has Natalya Domzalski normal appearance. Esophagus is unremarkable. No significant mediastinal, hilar, or axillary adenopathy. Lungs/Pleura: There is minimal atelectasis at the lung bases. No consolidations. No pleural effusions or evidence for pulmonary edema. Upper Abdomen: Liver is diffusely low attenuation consistent with hepatic steatosis. Gallbladder is present. Musculoskeletal: Convex RIGHT scoliosis. No acute vertebral fracture. There is an acute fracture of the RIGHT 8th rib. Remote fracture of the RIGHT LATERAL third rib. Review of the MIP  images confirms the above findings. IMPRESSION: 1. Technically adequate exam showing no acute pulmonary embolus. 2. Ascending thoracic aneurysm 4.9 centimeters. Ascending thoracic aortic aneurysm. Recommend semi-annual imaging followup by CTA or MRA and referral to cardiothoracic surgery if not already obtained. This recommendation follows 2010 ACCF/AHA/AATS/ACR/ASA/SCA/SCAI/SIR/STS/SVM Guidelines for the Diagnosis and Management of Patients With Thoracic Aortic Disease. Circulation. 2010; 121:: C623-J628 Aortic aneurysm NOS (ICD10-I71.9) Aortic aneurysm NOS (ICD10-I71.9). 3.  Aortic Atherosclerosis (ICD10-I70.0). 4. Acute fracture of the RIGHT 8th rib.  No pneumothorax. 5. Hepatic steatosis. Electronically Signed   By: ENolon NationsM.D.   On: 04/27/2021 20:35   CT Cervical Spine Wo Contrast  Result Date: 04/27/2021 CLINICAL DATA:  Head trauma, minor (Age >= 65y) EXAM: CT HEAD  WITHOUT CONTRAST CT CERVICAL SPINE WITHOUT CONTRAST TECHNIQUE: Multidetector CT imaging of the head and cervical spine was performed following the standard protocol without intravenous contrast. Multiplanar CT image reconstructions of the cervical spine were also generated. COMPARISON:  January 12, 2019 FINDINGS: CT HEAD FINDINGS Brain: No evidence of acute infarction, hemorrhage, hydrocephalus, extra-axial collection or mass lesion/mass effect. Periventricular white matter hypodensities consistent with sequela of chronic microvascular ischemic disease. Global parenchymal volume loss Vascular: Vascular calcifications. Skull: Normal. Negative for fracture or focal lesion. Sinuses/Orbits: No acute finding. Other: None. CT CERVICAL SPINE FINDINGS Alignment: Normal. Skull base and vertebrae: No acute fracture. No primary bone lesion or focal pathologic process. Soft tissues and spinal canal: No prevertebral fluid or swelling. No visible canal hematoma. Disc levels: Mild intervertebral disc space height loss at C4-5. Bilateral facet  arthropathy without high-grade canal narrowing. Upper chest: Negative. Other: None IMPRESSION: 1.  No acute intracranial abnormality. 2.  No acute fracture or static subluxation of the cervical spine. Electronically Signed   By: Valentino Saxon M.D.   On: 04/27/2021 14:01   MR PELVIS WO CONTRAST  Result Date: 04/28/2021 CLINICAL DATA:  Pelvic trauma EXAM: MRI PELVIS WITHOUT CONTRAST TECHNIQUE: Multiplanar multisequence MR imaging of the pelvis was performed. No intravenous contrast was administered. COMPARISON:  Radiograph 04/27/2021 FINDINGS: Urinary Tract: Markedly distended bladder. Left posterolateral bladder diverticulum. Bowel:  Sigmoid diverticulosis.  No bowel obstruction. Vascular/Lymphatic: No pathologically enlarged lymph nodes. No significant vascular abnormality seen. Reproductive:  No mass or other significant abnormality. Other:  None. Musculoskeletal: Prior right hip arthroplasty with associated susceptibility artifact. There is no evidence of acute fracture. There is severe left hip degenerative arthritis with subchondral cyst formation, sclerosis, and flattening of femoral head. Severe superolateral joint space narrowing. Small joint effusion which is likely related to hip arthritis. Likely degenerative anterosuperior labral tearing of the left hip. There is multilevel degenerative disc disease in the lower lumbar spine, partially visualized. There is confluent subchondral fat deposition within the right sacrum, compatible with prior inflammation. There is symmetric intramuscular edema signal within the inferior aspect of the gluteus maximus muscles bilaterally (coronal STIR image 6). There is also intramuscular edema to Shirle Provencal lesser degree at the origins of the gluteus medius and minimus muscles bilaterally (coronal stir images 13 and 15). There is peritendinitis and tendinosis of the proximal hamstrings bilaterally. IMPRESSION: No evidence of acute fracture. Severe left hip osteoarthritis  with femoral head flattening/articular surface collapse and likely arthritis related joint effusion. Symmetric extensive intramuscular edema within the inferior aspects of the gluteus maximus muscles and to Romulus Hanrahan much lesser degree the origins of the gluteus minimus and medius muscles, compatible with myositis or low-grade muscle strain. Electronically Signed   By: Maurine Simmering M.D.   On: 04/28/2021 08:13   DG HIP UNILAT WITH PELVIS 2-3 VIEWS LEFT  Result Date: 04/27/2021 CLINICAL DATA:  fall EXAM: DG HIP (WITH OR WITHOUT PELVIS) 2-3V LEFT COMPARISON:  None. FINDINGS: Severe osteopenia. Status post RIGHT hip arthroplasty. Severe degenerative changes of the LEFT hip with osseous remodeling of the acetabulum and femoral head. This limits evaluation for subtle fracture. No definitive acute fracture or dislocation noted within the limitations of this exam. Tubal ligation clips. Sacrum poorly assessed due to overlapping bowel contents. IMPRESSION: No definitive acute fracture or dislocation; however evaluation is limited by marked osteopenia and degenerative changes of the LEFT hip. If persistent concern for nondisplaced hip or pelvic fracture, recommend dedicated pelvic MRI. Electronically Signed   By: Colletta Maryland  Peacock M.D.   On: 04/27/2021 16:20   DG FEMUR, MIN 2 VIEWS RIGHT  Result Date: 04/27/2021 CLINICAL DATA:  Found down in the bathroom today. Dementia. RIGHT leg pain. EXAM: RIGHT FEMUR 2 VIEWS COMPARISON:  None. FINDINGS: Status post RIGHT hip arthroplasty. Hardware is intact and located. There is no acute fracture. Mild degenerative changes at the knee. IMPRESSION: No evidence for acute  abnormality.  Postoperative changes. Electronically Signed   By: Nolon Nations M.D.   On: 04/27/2021 20:07        Scheduled Meds:  carvedilol  12.5 mg Oral BID WC   enoxaparin (LOVENOX) injection  40 mg Subcutaneous Q24H   Continuous Infusions:  lactated ringers 100 mL/hr at 04/28/21 0655     LOS: 0 days     Time spent: over 30 min    Fayrene Helper, MD [facility]   To contact the attending provider between 7A-7P or the covering provider during after hours 7P-7A, please log into the web site [email or web address] and access using universal [place] password for that web site. If you do not have the password, please call the hospital operator.  04/28/2021, 10:10 AM
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
## 2021-04-28 NOTE — Evaluation (Signed)
Physical Therapy Evaluation Patient Details Name: [name] MRN: [id number] DOB: September 30, 1940 Today's Date: 04/28/2021  History of Present Illness  80 y.o. female with medical history significant of dementia, hypertension, skin cancer, and multiple other medical issues presenting after being found down in the bathroom.  Clinical Impression  Pt admitted with above diagnosis. Max assist for supine to sit, pt sat at edge of bed ~5 minutes, tolerance limited by 3/4 dyspnea/fatigue. SaO2 88% on room air at rest, 93% on 2L O2 at rest.  Pt currently with functional limitations due to the deficits listed below (see PT Problem List). Pt will benefit from skilled PT to increase their independence and safety with mobility to allow discharge to the venue listed below.          Recommendations for follow up therapy are one component of a multi-disciplinary discharge planning process, led by the attending physician.  Recommendations may be updated based on patient status, additional functional criteria and insurance authorization.  Follow Up Recommendations Skilled nursing-short term rehab (<3 hours/day)    Assistance Recommended at Discharge Frequent or constant Supervision/Assistance  Functional Status Assessment Patient has had a recent decline in their functional status and demonstrates the ability to make significant improvements in function in a reasonable and predictable amount of time.  Equipment Recommendations  None recommended by PT    Recommendations for Other Services       Precautions / Restrictions Precautions Precautions: Fall Precaution Comments: found down Restrictions Weight Bearing Restrictions: No      Mobility  Bed Mobility Overal bed mobility: Needs Assistance Bed Mobility: Supine to Sit;Sit to Supine     Supine to sit: Max assist Sit to supine: Max assist   General bed mobility comments: assist to raise trunk and pivot hips, then assist for LEs into bed and +2  total A to  scoot up in bed. SaO2 88% on room air at rest, 93% on 2L O2 at rest.    Transfers                   General transfer comment: did not attempt, pt fatigued with sitting EOB    Ambulation/Gait                Stairs            Wheelchair Mobility    Modified Rankin (Stroke Patients Only)       Balance                                             Pertinent Vitals/Pain Pain Assessment: Faces Faces Pain Scale: Hurts even more Pain Location: L thigh with movement of LLE Pain Descriptors / Indicators: Guarding;Grimacing Pain Intervention(s): Limited activity within patient's tolerance;Monitored during session;Repositioned    Home Living Family/patient expects to be discharged to:: Private residence Living Arrangements: Alone Available Help at Discharge: Family;Available PRN/intermittently Type of Home: House Home Access: Stairs to enter Entrance Stairs-Rails: Right Entrance Stairs-Number of Steps: 3     Home Equipment: Conservation officer, nature (2 wheels);Cane - quad      Prior Function Prior Level of Function : Independent/Modified Independent;Patient poor historian/Family not available             Mobility Comments: walks with RW as needed, denies h/o falls (but was found down just prior to this admission), reports son does driving  Hand Dominance        Extremity/Trunk Assessment   Upper Extremity Assessment Upper Extremity Assessment: Defer to OT evaluation    Lower Extremity Assessment Lower Extremity Assessment: Generalized weakness;RLE deficits/detail;LLE deficits/detail RLE Deficits / Details: B ankle DF/PF 4/5, knee ext 4/5 LLE Deficits / Details: pain in thigh with heel slide AAROM and with knee extension in sitting, knee ext -3/5 LLE: Unable to fully assess due to pain LLE Sensation: WNL    Cervical / Trunk Assessment Cervical / Trunk Assessment: Kyphotic  Communication   Communication: HOH   Cognition Arousal/Alertness: Awake/alert Behavior During Therapy: WFL for tasks assessed/performed Overall Cognitive Status: No family/caregiver present to determine baseline cognitive functioning                                 General Comments: able to follow commands, short term memory deficits (denied falls but found on floor PTA)        General Comments General comments (skin integrity, edema, etc.): SaO2 88% on room air at rest, 93% on 2L O2 at rest    Exercises  Long arc quads both in sitting x 10 AROM R; AAROM L   Assessment/Plan    PT Assessment Patient needs continued PT services  PT Problem List Decreased strength;Decreased range of motion;Decreased activity tolerance;Decreased balance;Pain;Decreased mobility       PT Treatment Interventions Gait training;Therapeutic activities;Therapeutic exercise;Patient/family education;Functional mobility training    PT Goals (Current goals can be found in the Care Plan section)  Acute Rehab PT Goals Patient Stated Goal: to get more strength PT Goal Formulation: With patient Time For Goal Achievement: 05/12/21 Potential to Achieve Goals: Fair    Frequency Min 2X/week   Barriers to discharge        Co-evaluation               AM-PAC PT "6 Clicks" Mobility  Outcome Measure Help needed turning from your back to your side while in a flat bed without using bedrails?: A Lot Help needed moving from lying on your back to sitting on the side of a flat bed without using bedrails?: A Lot Help needed moving to and from a bed to a chair (including a wheelchair)?: Total Help needed standing up from a chair using your arms (e.g., wheelchair or bedside chair)?: Total Help needed to walk in hospital room?: Total Help needed climbing 3-5 steps with a railing? : Total 6 Click Score: 8    End of Session   Activity Tolerance: Patient limited by fatigue;Patient limited by pain Patient left: in bed;with bed alarm  set;with call bell/phone within reach Nurse Communication: Mobility status PT Visit Diagnosis: Difficulty in walking, not elsewhere classified (R26.2);Pain;History of falling (Z91.81);Muscle weakness (generalized) (M62.81) Pain - Right/Left: Left Pain - part of body: Leg    Time: 9518-8416 PT Time Calculation (min) (ACUTE ONLY): 23 min   Charges:   PT Evaluation $PT Eval Moderate Complexity: 1 Mod          {Niurka Benecke, Glade Stanford PT 04/28/2021  Acute Rehabilitation Services Pager [phone] Office [phone]

## 2021-04-29 ENCOUNTER — Inpatient Hospital Stay (HOSPITAL_COMMUNITY): Payer: Medicare PPO

## 2021-04-29 DIAGNOSIS — T796XXA Traumatic ischemia of muscle, initial encounter: Secondary | ICD-10-CM | POA: Diagnosis not present

## 2021-04-29 DIAGNOSIS — W19XXXA Unspecified fall, initial encounter: Secondary | ICD-10-CM | POA: Diagnosis not present

## 2021-04-29 DIAGNOSIS — F0390 Unspecified dementia without behavioral disturbance: Secondary | ICD-10-CM

## 2021-04-29 DIAGNOSIS — R29898 Other symptoms and signs involving the musculoskeletal system: Secondary | ICD-10-CM | POA: Diagnosis not present

## 2021-04-29 LAB — CBC WITH DIFFERENTIAL/PLATELET
Abs Immature Granulocytes: 0.03 10*3/uL (ref 0.00–0.07)
Basophils Absolute: 0 10*3/uL (ref 0.0–0.1)
Basophils Relative: 0 %
Eosinophils Absolute: 0.1 10*3/uL (ref 0.0–0.5)
Eosinophils Relative: 1 %
HCT: 33.8 % — ABNORMAL LOW (ref 36.0–46.0)
Hemoglobin: 10.7 g/dL — ABNORMAL LOW (ref 12.0–15.0)
Immature Granulocytes: 1 %
Lymphocytes Relative: 18 %
Lymphs Abs: 1.1 10*3/uL (ref 0.7–4.0)
MCH: 32.6 pg (ref 26.0–34.0)
MCHC: 31.7 g/dL (ref 30.0–36.0)
MCV: 103 fL — ABNORMAL HIGH (ref 80.0–100.0)
Monocytes Absolute: 0.9 10*3/uL (ref 0.1–1.0)
Monocytes Relative: 14 %
Neutro Abs: 4.1 10*3/uL (ref 1.7–7.7)
Neutrophils Relative %: 66 %
Platelets: 195 10*3/uL (ref 150–400)
RBC: 3.28 MIL/uL — ABNORMAL LOW (ref 3.87–5.11)
RDW: 15.4 % (ref 11.5–15.5)
WBC: 6.3 10*3/uL (ref 4.0–10.5)
nRBC: 0.3 % — ABNORMAL HIGH (ref 0.0–0.2)

## 2021-04-29 LAB — COMPREHENSIVE METABOLIC PANEL
ALT: 19 U/L (ref 0–44)
AST: 21 U/L (ref 15–41)
Albumin: 3 g/dL — ABNORMAL LOW (ref 3.5–5.0)
Alkaline Phosphatase: 47 U/L (ref 38–126)
Anion gap: 7 (ref 5–15)
BUN: 14 mg/dL (ref 8–23)
CO2: 29 mmol/L (ref 22–32)
Calcium: 8.7 mg/dL — ABNORMAL LOW (ref 8.9–10.3)
Chloride: 100 mmol/L (ref 98–111)
Creatinine, Ser: 0.41 mg/dL — ABNORMAL LOW (ref 0.44–1.00)
GFR, Estimated: >60 mL/min (ref >60)
Glucose, Bld: 103 mg/dL — ABNORMAL HIGH (ref 70–99)
Potassium: 3.4 mmol/L — ABNORMAL LOW (ref 3.5–5.1)
Sodium: 136 mmol/L (ref 135–145)
Total Bilirubin: 1 mg/dL (ref 0.3–1.2)
Total Protein: 5.5 g/dL — ABNORMAL LOW (ref 6.5–8.1)

## 2021-04-29 LAB — CK: Total CK: 173 U/L (ref 38–234)

## 2021-04-29 LAB — LEGIONELLA PNEUMOPHILA SEROGP 1 UR AG: L. pneumophila Serogp 1 Ur Ag: NEGATIVE

## 2021-04-29 LAB — PHOSPHORUS: Phosphorus: 2.9 mg/dL (ref 2.5–4.6)

## 2021-04-29 LAB — MAGNESIUM: Magnesium: 1.7 mg/dL (ref 1.7–2.4)

## 2021-04-29 MED ORDER — POTASSIUM CHLORIDE CRYS ER 20 MEQ PO TBCR
40.0000 meq | EXTENDED_RELEASE_TABLET | Freq: Once | ORAL | Status: AC
  Administered 2021-04-29: 40 meq via ORAL
  Filled 2021-04-29: qty 2

## 2021-04-29 MED ORDER — MAGNESIUM SULFATE IN D5W 1-5 GM/100ML-% IV SOLN
1.0000 g | Freq: Once | INTRAVENOUS | Status: AC
  Administered 2021-04-29: 1 g via INTRAVENOUS
  Filled 2021-04-29: qty 100

## 2021-04-29 NOTE — TOC PASRR Note (Addendum)
   RE: [id number]    Date of Birth: 1940/12/19  Date: 04/29/2021     To Whom It May Concern:  Please be advised that the above-named patient will require a short-term nursing home stay - anticipated 30 days or less for rehabilitation and strengthening.  The plan is for return to ALF.                 MD signature                Date

## 2021-04-29 NOTE — NC FL2 (Signed)
  [place] LEVEL OF CARE SCREENING TOOL     IDENTIFICATION  Patient Name: [name] Birthdate: 09-Aug-1940 Sex: female Admission Date (Current Location): 04/27/2021  [facility] and [place] Number:  Herbalist and Address:  [facility],  [place], [place]      Provider Number: [id number]  Attending Physician Name and Address:  [name]., *  Relative Name and Phone Number:  [name], [name]  [phone]    Current Level of Care: Hospital Recommended Level of Care: [place] Prior Approval Number:    Date Approved/Denied:   PASRR Number:    Discharge Plan: SNF    Current Diagnoses: Patient Active Problem List   Diagnosis Date Noted   Elevated CK 04/28/2021   Traumatic rhabdomyolysis ([place])    Community acquired pneumonia 04/27/2021   GERD (gastroesophageal reflux disease) 08/19/2020   Hypokalemia 08/19/2020   Urinary frequency 08/19/2020   Weakness 08/19/2020   Dyspnea 08/19/2020   Troponin I above reference range 08/19/2020   Skin cancer 06/09/2017   Essential hypertension 08/26/2015    Orientation RESPIRATION BLADDER Height & Weight     Self, Time  O2 (2 L [place] O2) External catheter, Incontinent Weight: 82.7 kg Height:  5' (152.4 cm)  BEHAVIORAL SYMPTOMS/MOOD NEUROLOGICAL BOWEL NUTRITION STATUS      Incontinent Diet (Heart Healthy, Thin liquid)  AMBULATORY STATUS COMMUNICATION OF NEEDS Skin   Extensive Assist Verbally Other (Comment) (Ecchymosis right arm, moisture Buttock, right, Left and Mid)                       Personal Care Assistance Level of Assistance  Bathing, Feeding, Dressing Bathing Assistance: Maximum assistance Feeding assistance: Limited assistance Dressing Assistance: Maximum assistance     Functional Limitations Info  Sight, Hearing, Speech Sight Info: Impaired Hearing Info: Impaired Speech Info: Adequate    SPECIAL CARE FACTORS FREQUENCY  PT  (By licensed PT), OT (By licensed OT)     PT Frequency: x5 week OT Frequency: x5 week            Contractures Contractures Info: Not present    Additional Factors Info  Code Status, Allergies Code Status Info: FULL Allergies Info: Molds & Smuts, Other, Statins           Current Medications (04/29/2021):  This is the current hospital active medication list Current Facility-Administered Medications  Medication Dose Route Frequency Provider Last Rate Last Admin   acetaminophen (TYLENOL) tablet 650 mg  650 mg Oral Q6H PRN [name]., MD   650 mg at 04/29/21 6720   Or   acetaminophen (TYLENOL) suppository 650 mg  650 mg Rectal Q6H PRN [name]., MD       carvedilol (COREG) tablet 12.5 mg  12.5 mg Oral BID WC [name]., MD   12.5 mg at 04/29/21 0815   COVID-19 mRNA bivalent vaccine (Pfizer) injection 0.3 mL  0.3 mL Intramuscular ONCE-1600 [name]., MD       enoxaparin (LOVENOX) injection 40 mg  40 mg Subcutaneous Q24H [name]., MD   40 mg at 04/28/21 2141     Discharge Medications: Please see discharge summary for a list of discharge medications.  Relevant Imaging Results:  Relevant Lab Results:   Additional Information SS#[id number]  Purcell Mouton, RN

## 2021-04-29 NOTE — Progress Notes (Signed)
**Note De-Identified vi Obfusction** PROGRESS NOTE    Shunti Exton  XQJ:[id number] DOB: Jn 17, 1942 DOA: 04/27/2021 PCP: Orph Melter, MD    No chief complint on file.   Brief Nrrtive:  Loreni Hoston is  80 y.o. femle with medicl history significnt of dementi, hypertension, skin cncer, nd multiple other medicl issues presenting fter being found down in the bthroom.  She hd bilterl lower extremity wekness t presenttion.  Imging not reveling for cuse of wekness.  Neurology sw her thought wekness relted to pin nd compressive neuropthy of L femorl nerve.  LE wekness seems to be improving now.  At this point, pln for SNF.  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[place])  Bilterl Lower Extremity Wekness  MRI pelvis without cute fx - severe L hip OA, intrmusculr edem within inferior spects of gluteus mximus muscles nd origins of gluteus minimus nd medius muscles - myositis vs low grde strin ESR, CRP - for concern for myositis on imging (possibly relted to being found down?) MTI T/L spine without cute findings MRI brin with findings which could be c/w NPH in pproprite scenrio Neurology notes combintion of compressive neuropthy of L femorl nerve in inguinl cnl region nd relted to pin -> continue PT/OT, follow outptient  Found Down  Concern for Mechnicl Fll  Elevted CK Found in bthroom between wll nd toilet Son notes, she's been hving episodes where she's been found down nd this ws prt of reson for her going to [facility] (?she chooses to sit or lie down, rther thn flls? - never evidence of trum before - not sure wht this ws) Unwitnessed event, uncler if fll -> 8th rib fx suggests trum/fll CT hed/neck - no cute intrcrnil bnormlity, no cute fx or sttic subluxtion of cervicl spine Hip plin films without cute fx or  disloction Follow MRI pelvis - s noted bove, no fx, severe L hip OA, femorl hed flttening/rticulr surfce collpse, rthritis relted joint effusion - symmetric intrmusculr edem within inferior spects of gluteus mximus muscles nd the origins of gluteus minimus nd medius muscles - myositis vs low grde muscle strin Plin films of RLE - femur, knee, tib fib unremrkble  MRI for LE wekness s noted bove CK improving, no RBC's on UA Needs therpy evls pending w/u    Left Lower Lobe Airspce Opcities  Acute Hypoxic Respirtory Filure She's destting, especilly when sleep ? OSA, will need outptient sleep study CT chest without consolidtion, effusion, edem Stop bx, no evidence pneumoni on CT scn Sputum, urine strep, urine legionell Negtive COVID nd influenz Wen O2 s tolerted  Right 8th Rib Frcture Pin control Follow  IS Could contribute to bove with splinting  Ascending Thorcic Aneurysm Needs semi nnul imging nd CT surgery referrl - son notes she's lredy following up regrding this   Dementi Delirium precutions   HTN Coreg Hold mlodipine for now  Holding crestor for now with initilly elevted CK, imging findings with myositis   Chronic LBBB   DVT prophylxis: lovenox Code Sttus: full Fmily Communiction: son 10/24 Disposition:   Sttus is: inptient  The ptient will require cre spnning > 2 midnights nd should be moved to inptient becuse: continued workup of LE wekness, therpy evls      Consultnts:  none  Procedures:  none  Antimicrobils: Anti-infectives (From dmission, onwrd)    Strt     Dose/Rte Route Frequency Ordered Stop  04/28/21 1400  cefTRIXone (ROCEPHIN) 2 g in sodium chloride 0.9 % 100 mL IVPB  Status:  Discontinued        2 g 200 mL/hr over 30 Minutes Intravenous Every 24 hours 04/27/21 1929 04/28/21 0754   04/28/21 1400  azithromycin (ZITHROMX) 500 mg in sodium chloride 0.9 % 250  mL IVPB  Status:  Discontinued        500 mg 250 mL/hr over 60 Minutes Intravenous Every 24 hours 04/27/21 1929 04/28/21 0754   04/27/21 1600  cefTRIXone (ROCEPHIN) 2 g in sodium chloride 0.9 % 100 mL IVPB        2 g 200 mL/hr over 30 Minutes Intravenous  Once 04/27/21 1549 04/27/21 1730   04/27/21 1600  azithromycin (ZITHROMX) 500 mg in sodium chloride 0.9 % 250 mL IVPB        500 mg 250 mL/hr over 60 Minutes Intravenous  Once 04/27/21 1549 04/27/21 1809          Subjective: Pleasantly confused  Objective: Vitals:   04/28/21 2010 04/29/21 0511 04/29/21 0909 04/29/21 1355  BP: (!) 146/68 (!) 176/78 139/62 122/67  Pulse: 77 81 70 76  Resp: _0 Temp: 99.4 F (37.4 C) 98.6 F (37 C) 98.1 F (36.7 C) 99.1 F (37.3 C)  TempSrc: Oral Oral Oral Oral  SpO2: 97% 96% 97% 98%  Weight:      Height:        Intake/Output Summary (Last 24 hours) at 04/29/2021 1959 Last data filed at 04/29/2021 1752 Gross per 24 hour  Intake 1094.56 ml  Output 2000 ml  Net -905.44 ml   Filed Weights   04/27/21 1311 04/27/21 1736  Weight: 81.6 kg 82.7 kg    Examination:  General: No acute distress. Cardiovascular:RRR Lungs: unlabored bdomen: Soft, nontender, nondistended Neurological: improving LE weakness, able to lift legs off ground R>L Skin: Warm and dry. No rashes or lesions. Extremities: No clubbing or cyanosis. No edema.     Data Reviewed: I have personally reviewed following labs and imaging studies  CBC: Recent Labs  Lab 04/27/21 1416 04/27/21 1818 04/28/21 0341 04/29/21 0402  WBC 15.6* 10.4 7.2 6.3  NEUTROBS 12.9*  --   --  4.1  HGB 13.6 11.9* 11.1* 10.7*  HCT 40.8 36.5 34.9* 33.8*  MCV 98.1 100.6* 101.5* 103.0*  PLT 268 223 214 726    Basic Metabolic Panel: Recent Labs  Lab 04/27/21 1416 04/27/21 1818 04/28/21 0341 04/29/21 0402  N 136  --  138 136  K 3.5  --  3.7 3.4*  CL 99  --  104 100  CO2 27  --  30 29  GLUCOSE 119*  --  97 103*  BUN  39*  --  32* 14  CRETININE 0.66 0.58 0.50 0.41*  CLCIUM 9.7  --  8.9 8.7*  MG  --   --   --  1.7  PHOS  --   --   --  2.9    GFR: Estimated Creatinine Clearance: 54.4 mL/min () (by C-G formula based on SCr of 0.41 mg/dL (L)).  Liver Function Tests: Recent Labs  Lab 04/27/21 1416 04/28/21 0341 04/29/21 0402  ST 40 27 21  LT _1 LKPHOS 64 46 47  BILITOT 0.8 0.6 1.0  PROT 7.3 5.6* 5.5*  LBUMIN 4.2 3.2* 3.0*    CBG: Recent Labs  Lab 04/27/21 1357  GLUCP 112*     Recent Results (from the past 240  hour(s))  Resp Panel by RT-PCR (Flu Taydon Nasworthy&B, Covid) Nasopharyngeal Swab     Status: None   Collection Time: 04/27/21  4:41 PM   Specimen: Nasopharyngeal Swab; Nasopharyngeal(NP) swabs in vial transport medium  Result Value Ref Range Status   SARS Coronavirus 2 by RT PCR NEGATIVE NEGATIVE Final    Comment: (NOTE) SARS-CoV-2 target nucleic acids are NOT DETECTED.  The SARS-CoV-2 RNA is generally detectable in upper respiratory specimens during the acute phase of infection. The lowest concentration of SARS-CoV-2 viral copies this assay can detect is 138 copies/mL. Eagle Pitta negative result does not preclude SARS-Cov-2 infection and should not be used as the sole basis for treatment or other patient management decisions. Hannah Crill negative result may occur with  improper specimen collection/handling, submission of specimen other than nasopharyngeal swab, presence of viral mutation(s) within the areas targeted by this assay, and inadequate number of viral copies(<138 copies/mL). Jerrian Mells negative result must be combined with clinical observations, patient history, and epidemiological information. The expected result is Negative.  Fact Sheet for Patients:  EntrepreneurPulse.com.au  Fact Sheet for Healthcare Providers:  IncredibleEmployment.be  This test is no t yet approved or cleared by the [place] FDA and  has been authorized for detection  and/or diagnosis of SARS-CoV-2 by FDA under an Emergency Use Authorization (EUA). This EUA will remain  in effect (meaning this test can be used) for the duration of the COVID-19 declaration under Section 564(b)(1) of the Act, 21 U.S.C.section 360bbb-3(b)(1), unless the authorization is terminated  or revoked sooner.       Influenza Carolyn Sylvia by PCR NEGATIVE NEGATIVE Final   Influenza B by PCR NEGATIVE NEGATIVE Final    Comment: (NOTE) The Xpert Xpress SARS-CoV-2/FLU/RSV plus assay is intended as an aid in the diagnosis of influenza from Nasopharyngeal swab specimens and should not be used as Arish Redner sole basis for treatment. Nasal washings and aspirates are unacceptable for Xpert Xpress SARS-CoV-2/FLU/RSV testing.  Fact Sheet for Patients: EntrepreneurPulse.com.au  Fact Sheet for Healthcare Providers: IncredibleEmployment.be  This test is not yet approved or cleared by the [place] FDA and has been authorized for detection and/or diagnosis of SARS-CoV-2 by FDA under an Emergency Use Authorization (EUA). This EUA will remain in effect (meaning this test can be used) for the duration of the COVID-19 declaration under Section 564(b)(1) of the Act, 21 U.S.C. section 360bbb-3(b)(1), unless the authorization is terminated or revoked.  Performed at [facility], [place] [address] [place], [place] [address]          Radiology Studies: CT Angio Chest Pulmonary Embolism (PE) W or WO Contrast  Result Date: 04/27/2021 CLINICAL DATA:  Found down.  Pulmonary embolus suspected. EXAM: CT ANGIOGRAPHY CHEST WITH CONTRAST TECHNIQUE: Multidetector CT imaging of the chest was performed using the standard protocol during bolus administration of intravenous contrast. Multiplanar CT image reconstructions and MIPs were obtained to evaluate the vascular anatomy. CONTRAST:  80m OMNIPAQUE IOHEXOL 350 MG/ML SOLN COMPARISON:  Two-view chest today. FINDINGS:  Cardiovascular: Heart size is normal. There are coronary artery calcifications. There is minimal atherosclerotic calcification of the thoracic aorta. The ascending aorta is aneurysmal, measuring 4.9 centimeters. Pulmonary arteries are well opacified and there is no evidence for acute pulmonary embolus. Mediastinum/Nodes: The visualized portion of the thyroid gland has Moesha Sarchet normal appearance. Esophagus is unremarkable. No significant mediastinal, hilar, or axillary adenopathy. Lungs/Pleura: There is minimal atelectasis at the lung bases. No consolidations. No pleural effusions or evidence for pulmonary edema. Upper Abdomen: Liver is diffusely  low attenuation consistent with hepatic steatosis. Gallbladder is present. Musculoskeletal: Convex RIGHT scoliosis. No acute vertebral fracture. There is an acute fracture of the RIGHT 8th rib. Remote fracture of the RIGHT LATERAL third rib. Review of the MIP images confirms the above findings. IMPRESSION: 1. Technically adequate exam showing no acute pulmonary embolus. 2. Ascending thoracic aneurysm 4.9 centimeters. Ascending thoracic aortic aneurysm. Recommend semi-annual imaging followup by CTA or MRA and referral to cardiothoracic surgery if not already obtained. This recommendation follows 2010 ACCF/AHA/AATS/ACR/ASA/SCA/SCAI/SIR/STS/SVM Guidelines for the Diagnosis and Management of Patients With Thoracic Aortic Disease. Circulation. 2010; 121: E993-Z169. Aortic aneurysm NOS (ICD10-I71.9) Aortic aneurysm NOS (ICD10-I71.9). 3.  Aortic Atherosclerosis (ICD10-I70.0). 4. Acute fracture of the RIGHT 8th rib.  No pneumothorax. 5. Hepatic steatosis. Electronically Signed   By: Nolon Nations M.D.   On: 04/27/2021 20:35   MR BRAIN WO CONTRAST  Result Date: 04/28/2021 CLINICAL DATA:  Neuro deficit, acute, stroke suspected. EXAM: MRI HEAD WITHOUT CONTRAST TECHNIQUE: Multiplanar, multiecho pulse sequences of the brain and surrounding structures were obtained without intravenous  contrast. COMPARISON:  Head CT April 27, 2021; MRI of the brain August 21, 2020. FINDINGS: Brain: No acute infarction, hemorrhage, extra-axial collection or mass lesion. Scattered and confluent foci of T2 hyperintensity are seen within the white matter of the cerebral hemispheres, nonspecific, most likely related to chronic small vessel ischemia. Remote lacunar infarct in the left corona radiata. Prominence of the anterior frontal sulci and sylvian fissures with relative preservation of the high convexity sulci and prominence of the supratentorial ventricles, may be seen in the setting of normal pressure hydrocephalus in the appropriate clinical scenario. Moderate parenchymal volume loss, more pronounced in the anterior temporal and frontal regions. Vascular: Normal flow voids. Skull and upper cervical spine: Normal marrow signal. Sinuses/Orbits: Negative. Other: None. IMPRESSION: 1. No acute intracranial abnormality. 2. Moderate chronic microvascular ischemic changes of the white matter. 3. Prominence of the anterior frontal sulci and sylvian fissures with relative preservation of the high convexity sulci and prominence of the supratentorial ventricles, may be seen in the setting of normal pressure hydrocephalus in the appropriate clinical scenario. Electronically Signed   By: Pedro Earls M.D.   On: 04/28/2021 17:10   MR THORACIC SPINE WO CONTRAST  Result Date: 04/29/2021 CLINICAL DATA:  Bilateral weakness EXAM: MRI THORACIC SPINE WITHOUT CONTRAST TECHNIQUE: Multiplanar, multisequence MR imaging of the thoracic spine was performed. No intravenous contrast was administered. COMPARISON:  None. FINDINGS: Alignment: No significant listhesis. Mild endplate retropulsion at T12. Vertebrae: Chronic T12 compression fracture. Vertebral body heights are otherwise maintained. No substantial marrow edema. No suspicious osseous lesion. Cord:  No abnormal signal. Paraspinal and other soft tissues:  Unremarkable. Disc levels: Mild multilevel degenerative disc disease with minimal disc bulges or protrusions. Mild facet arthropathy. Small facet joint effusions at mid to lower thoracic levels. No high-grade canal or foraminal narrowing at any level. IMPRESSION: Mild degenerative changes without high-grade stenosis. Chronic T12 compression fracture. Electronically Signed   By: Macy Mis M.D.   On: 04/29/2021 15:12   MR LUMBAR SPINE WO CONTRAST  Result Date: 04/28/2021 CLINICAL DATA:  Low back pain, increased fracture risk EXAM: MRI LUMBAR SPINE WITHOUT CONTRAST TECHNIQUE: Multiplanar, multisequence MR imaging of the lumbar spine was performed. No intravenous contrast was administered. COMPARISON:  None. FINDINGS: Segmentation: Standard segmentation is assumed. The inferior-most fully formed intervertebral disc is labeled L5-S1. Alignment: Levocurvature of the upper lumbar spine with dextrocurvature of the lower lumbar spine. Slight retrolisthesis of L2  on L3 and L3 on L4. Vertebrae: T12 compression fracture with approximately 40% height loss and degenerative Schmorl's node along the superior endplate. No marrow edema to suggest that this fracture is acute. No marrow edema at other lumbar levels to suggest acute fracture or discitis/osteomyelitis. Scattered benign vertebral venous malformations, largest at L2. No suspicious bone lesions. Conus medullaris and cauda equina: Conus extends to the L1-L2 level. Conus and cauda equina appear normal. Paraspinal and other soft tissues: Unremarkable. Disc levels: T12-L1: Mild disc bulging and facet arthropathy without significant canal or foraminal stenosis. L1-L2: Mild disc bulging and facet arthropathy without significant canal or foraminal stenosis. Mild right subarticular recess narrowing. L2-L3: Right eccentric disc bulge with endplate spurring. Mild right greater than left facet arthropathy. Resulting mild right foraminal stenosis and mild right subarticular  recess narrowing without significant canal or left foraminal stenosis. L3-L4: Small broad disc bulge with endplate spurring and superimposed left foraminal disc protrusion. Left greater than right facet arthropathy. Resulting moderate left and mild right foraminal stenosis. No significant canal stenosis. L4-L5: Broad disc bulge with endplate spurring. Left greater than right facet arthropathy. Resulting mild-to-moderate left foraminal stenosis without significant canal or right foraminal stenosis. Mild bilateral subarticular recess narrowing. L5-S1: Mild disc bulging with severe bilateral facet arthropathy. No significant canal or foraminal stenosis. IMPRESSION: 1. Remote appearing T12 compression fracture with approximately 40% height loss. No marrow edema to suggest acute fracture. 2. At L3-L4, moderate left and mild right foraminal stenosis. 3. At L4-L5, mild-to-moderate left foraminal stenosis and mild bilateral subarticular recess narrowing. 4. At L5-S1, severe bilateral facet arthropathy without significant stenosis. 5. Levocurvature of the upper lumbar spine with dextrocurvature lower lumbar spine. Electronically Signed   By: Margaretha [place] M.D.   On: 04/28/2021 17:28        Scheduled Meds:  carvedilol  12.5 mg Oral BID WC   enoxaparin (LOVENOX) injection  40 mg Subcutaneous Q24H   Continuous Infusions:     LOS: 1 day    Time spent: over 30 min    Fayrene Helper, MD [facility]   To contact the attending provider between 7A-7P or the covering provider during after hours 7P-7A, please log into the web site [email or web address] and access using universal [place] password for that web site. If you do not have the password, please call the hospital operator.  04/29/2021, 7:59 PM

## 2021-04-29 NOTE — TOC Progression Note (Signed)
Transition of Care [facility]) - Progression Note    Patient Details  Name: [name] MRN: [id number] Date of Birth: 01-26-1941  [facility] CM/SW Contact  Purcell Mouton, RN Phone Number: 04/29/2021, 3:08 PM  Clinical Narrative:    Spoke with pt's son [name] who agreed with pt going to [place] for short term. Will start PASRR and fax to SNF's.   Expected Discharge Plan: [place] Barriers to Discharge: No Barriers Identified  Expected Discharge Plan and Services Expected Discharge Plan: [place]   Discharge Planning Services: CM Consult   Living arrangements for the past 2 months: [place]                                       Social Determinants of Health (SDOH) Interventions    Readmission Risk Interventions No flowsheet data found.

## 2021-04-29 NOTE — TOC Progression Note (Signed)
Transition of Care [facility]) - Progression Note    Patient Details  Name: Torrie Namba MRN: [id number] Date of Birth: [birth year]  [facility]) CM/SW Contact  Purcell Mouton, RN Phone Number: 04/29/2021, 1:35 PM  Clinical Narrative:    Pt from [facility].  PT recommend SNF. Will follow up with pt's adult children.    Expected Discharge Plan: [place] Barriers to Discharge: No Barriers Identified  Expected Discharge Plan and Services Expected Discharge Plan: [place]   Discharge Planning Services: CM Consult   Living arrangements for the past 2 months: [place]                                       Social Determinants of Health (SDOH) Interventions    Readmission Risk Interventions No flowsheet data found.

## 2021-04-29 NOTE — Consult Note (Addendum)
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 feel free to contact us with any questions or concerns.  Donnetta Simpers, MD [facility] [phone]   If 7pm to 7am, please call on  call as listed on AMION.

## 2021-04-30 ENCOUNTER — Other Ambulatory Visit: Payer: Self-pay | Admitting: Psychiatry

## 2021-04-30 DIAGNOSIS — J189 Pneumonia, unspecified organism: Secondary | ICD-10-CM | POA: Diagnosis not present

## 2021-04-30 DIAGNOSIS — R413 Other amnesia: Secondary | ICD-10-CM

## 2021-04-30 MED ORDER — AMLODIPINE BESYLATE 5 MG PO TABS
5.0000 mg | ORAL_TABLET | Freq: Every day | ORAL | Status: DC
  Administered 2021-04-30 – 2021-05-02 (×3): 5 mg via ORAL
  Filled 2021-04-30 (×3): qty 1

## 2021-04-30 NOTE — Progress Notes (Signed)
PROGRESS NOTE  [name] SNK:[id number] DOB: 12-29-1940 DOA: 04/27/2021 PCP: Orpah Melter, MD   LOS: 2 days   Brief Narrative / Interim history: 80 year old female with dementia, HTN, aortic aneurysm, anxiety/depression, chronic urinary frequency who comes into the hospital and is admitted after being found down with concern for a fall.  On admission she had bilateral lower extremity weakness.  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  The lower extremity weakness seems to be improving  Subjective / 24h Interval events: Feels well today, she has no complaints.  Denies any weakness.  Assessment & Plan: Principal Problem Bilateral Lower Extremity Weakness  -MRI pelvis without acute fx - severe L hip OA, intramuscular edema within inferior aspects of gluteus maximus muscles and origins of gluteus minimus and medius muscles - myositis vs low grade strain ESR, CRP - for concern for myositis on imaging (possibly related to being found down?). MTI T/L spine without acute findings. MRI brain with findings which could be c/w NPH in appropriate scenario. Per neurology if this represents perhaps compressive neuropathy of L femoral nerve in inguinal canal region and related to pain.  Improving   Active problems Found Down  Concern for Mechanical Fall  Elevated CK -Found in bathroom between wall and toilet. Son notes, she's been having episodes where she's been found down and this was part of reason for her going to [facility] (?she chooses to sit or lie down, rather than falls? - never evidence of trauma before - not sure what this was). Unwitnessed event, unclear if fall -> 8th rib fx suggests trauma/fall. CT head/neck - no acute intracranial abnormality, no acute fx or static subluxation of cervical spine. Hip plain films without acute fx or dislocation.  MRI of the pelvis with  severe L hip OA, femoral head flattening/articular surface collapse, arthritis related joint effusion - symmetric intramuscular edema within inferior aspects of gluteus maximus muscles and the origins of gluteus minimus and medius muscles - myositis vs low grade muscle strain. Plain films of RLE - femur, knee, tib fib unremarkable  Left Lower Lobe Airspace Opacities  Acute Hypoxic Respiratory Failure She's desatting, especially when asleep ? OSA, will need outpatient sleep study CT chest without consolidation, effusion, edema Right 8th Rib Fracture -Pain control Ascending Thoracic Aneurysm -Needs semi annual imaging and CT surgery referral - son notes she's already following up regarding this Dementia-Delirium precautions HTN - monitor Chronic LBBB  Scheduled Meds:  amLODipine  5 mg Oral Daily   carvedilol  12.5 mg Oral BID WC   enoxaparin (LOVENOX) injection  40 mg Subcutaneous Q24H   Continuous Infusions: PRN Meds:.acetaminophen **OR** acetaminophen  Diet Orders (From admission, onward)     Start     Ordered   04/27/21 1751  Diet Heart Room service appropriate? Yes; Fluid consistency: Thin  Diet effective now       Question Answer Comment  Room service appropriate? Yes   Fluid consistency: Thin      04/27/21 1750            DVT prophylaxis: enoxaparin (LOVENOX) injection 40 mg Start: 04/27/21 2200     Code Status: Full Code  Family Communication: will call son later  Status is: Inpatient  Remains inpatient appropriate because: unsafe dc plan, awaiting SNF  Level of care: Telemetry  Consultants:  none  Procedures:  none  Microbiology  none  Antimicrobials: none    Objective: Vitals:   04/30/21 0800 04/30/21 0801 04/30/21 0923 04/30/21 1349  BP:  (!) 184/103 (!) 150/90 (!) 156/92  Pulse:    84  Resp: 20   (!) 22  Temp:    98.4 F (36.9 C)  TempSrc:    Oral  SpO2:    92%  Weight:      Height:        Intake/Output Summary (Last 24 hours) at  04/30/2021 1359 Last data filed at 04/30/2021 1000 Gross per 24 hour  Intake 440 ml  Output 1800 ml  Net -1360 ml   Filed Weights   04/27/21 1311 04/27/21 1736  Weight: 81.6 kg 82.7 kg    Examination:  Constitutional: NAD Eyes: no scleral icterus ENMT: Mucous membranes are moist.  Neck: normal, supple Respiratory: clear to auscultation bilaterally, no wheezing, no crackles. Normal respiratory effort. No accessory muscle use.  Cardiovascular: Regular rate and rhythm, no murmurs / rubs / gallops. No LE edema. Good peripheral pulses Abdomen: non distended, no tenderness. Bowel sounds positive.  Musculoskeletal: no clubbing / cyanosis.  Skin: no rashes Neurologic: CN 2-12 grossly intact. Strength 5/5 in all 4.  Psychiatric: Normal judgment and insight. Alert and oriented x 3. Normal mood.    Data Reviewed: I have independently reviewed following labs and imaging studies   CBC: Recent Labs  Lab 04/27/21 1416 04/27/21 1818 04/28/21 0341 04/29/21 0402  WBC 15.6* 10.4 7.2 6.3  NEUTROABS 12.9*  --   --  4.1  HGB 13.6 11.9* 11.1* 10.7*  HCT 40.8 36.5 34.9* 33.8*  MCV 98.1 100.6* 101.5* 103.0*  PLT 268 223 214 559   Basic Metabolic Panel: Recent Labs  Lab 04/27/21 1416 04/27/21 1818 04/28/21 0341 04/29/21 0402  NA 136  --  138 136  K 3.5  --  3.7 3.4*  CL 99  --  104 100  CO2 27  --  30 29  GLUCOSE 119*  --  97 103*  BUN 39*  --  32* 14  CREATININE 0.66 0.58 0.50 0.41*  CALCIUM 9.7  --  8.9 8.7*  MG  --   --   --  1.7  PHOS  --   --   --  2.9   Liver Function Tests: Recent Labs  Lab 04/27/21 1416 04/28/21 0341 04/29/21 0402  AST 40 27 21  ALT 26 21 19   ALKPHOS 64 46 47  BILITOT 0.8 0.6 1.0  PROT 7.3 5.6* 5.5*  ALBUMIN 4.2 3.2* 3.0*   Coagulation Profile: No results for input(s): INR, PROTIME in the last 168 hours. HbA1C: No results for input(s): HGBA1C in the last 72 hours. CBG: Recent Labs  Lab 04/27/21 1357  GLUCAP 112*    Recent Results  (from the past 240 hour(s))  Resp Panel by RT-PCR (Flu A&B, Covid) Nasopharyngeal Swab     Status: None   Collection Time: 04/27/21  4:41 PM   Specimen: Nasopharyngeal Swab; Nasopharyngeal(NP) swabs in vial transport medium  Result Value Ref Range Status   SARS Coronavirus 2 by RT PCR NEGATIVE NEGATIVE Final    Comment: (NOTE) SARS-CoV-2 target nucleic acids are NOT DETECTED.  The SARS-CoV-2 RNA is generally detectable in upper respiratory specimens during the acute phase of infection. The lowest concentration of SARS-CoV-2 viral copies this assay can detect is 138 copies/mL. A negative result does not preclude SARS-Cov-2 infection and should not be used as the sole basis for treatment or other patient management decisions.  A negative result may occur with  improper specimen collection/handling, submission of specimen other than nasopharyngeal swab, presence of viral mutation(s) within the areas targeted by this assay, and inadequate number of viral copies(<138 copies/mL). A negative result must be combined with clinical observations, patient history, and epidemiological information. The expected result is Negative.  Fact Sheet for Patients:  EntrepreneurPulse.com.au  Fact Sheet for Healthcare Providers:  IncredibleEmployment.be  This test is no t yet approved or cleared by the [place] FDA and  has been authorized for detection and/or diagnosis of SARS-CoV-2 by FDA under an Emergency Use Authorization (EUA). This EUA will remain  in effect (meaning this test can be used) for the duration of the COVID-19 declaration under Section 564(b)(1) of the Act, 21 U.S.C.section 360bbb-3(b)(1), unless the authorization is terminated  or revoked sooner.       Influenza A by PCR NEGATIVE NEGATIVE Final   Influenza B by PCR NEGATIVE NEGATIVE Final    Comment: (NOTE) The Xpert Xpress SARS-CoV-2/FLU/RSV plus assay is intended as an aid in the  diagnosis of influenza from Nasopharyngeal swab specimens and should not be used as a sole basis for treatment. Nasal washings and aspirates are unacceptable for Xpert Xpress SARS-CoV-2/FLU/RSV testing.  Fact Sheet for Patients: EntrepreneurPulse.com.au  Fact Sheet for Healthcare Providers: IncredibleEmployment.be  This test is not yet approved or cleared by the [place] FDA and has been authorized for detection and/or diagnosis of SARS-CoV-2 by FDA under an Emergency Use Authorization (EUA). This EUA will remain in effect (meaning this test can be used) for the duration of the COVID-19 declaration under Section 564(b)(1) of the Act, 21 U.S.C. section 360bbb-3(b)(1), unless the authorization is terminated or revoked.  Performed at [facility], [place] [address], [place], [place] [address]      Radiology Studies: MR THORACIC SPINE WO CONTRAST  Result Date: 04/29/2021 CLINICAL DATA:  Bilateral weakness EXAM: MRI THORACIC SPINE WITHOUT CONTRAST TECHNIQUE: Multiplanar, multisequence MR imaging of the thoracic spine was performed. No intravenous contrast was administered. COMPARISON:  None. FINDINGS: Alignment: No significant listhesis. Mild endplate retropulsion at T12. Vertebrae: Chronic T12 compression fracture. Vertebral body heights are otherwise maintained. No substantial marrow edema. No suspicious osseous lesion. Cord:  No abnormal signal. Paraspinal and other soft tissues: Unremarkable. Disc levels: Mild multilevel degenerative disc disease with minimal disc bulges or protrusions. Mild facet arthropathy. Small facet joint effusions at mid to lower thoracic levels. No high-grade canal or foraminal narrowing at any level. IMPRESSION: Mild degenerative changes without high-grade stenosis. Chronic T12 compression fracture. Electronically Signed   By: Macy Mis M.D.   On: 04/29/2021 15:12     Marzetta Board, MD, PhD [facility]  Between 7 am - 7 pm I am available, please contact me via Amion (for emergencies) or Securechat (non urgent messages)  Between 7 pm - 7 am I am not available, please contact night coverage MD/APP via Amion

## 2021-04-30 NOTE — Progress Notes (Signed)
   04/30/21 0923  Vitals  BP (!) 150/90  MEWS COLOR  MEWS Score Color Green  MEWS Score  MEWS Temp 0  MEWS Systolic 0  MEWS Pulse 0  MEWS RR 0  MEWS LOC 0  MEWS Score 0  MD aware BP is still elevated after coreg, amlodipine was resumed

## 2021-04-30 NOTE — Progress Notes (Signed)
   04/30/21 0759  Assess: MEWS Score  Temp 98.7 F (37.1 C)  BP (!) 195/91  ECG Heart Rate 94  Resp 20  SpO2 94 %  O2 Device Room Air  Assess: MEWS Score  MEWS Temp 0  MEWS Systolic 0  MEWS Pulse 0  MEWS RR 0  MEWS LOC 0  MEWS Score 0  MEWS Score Color Green  Assess: SIRS CRITERIA  SIRS Temperature  0  SIRS Pulse 1  SIRS Respirations  0  SIRS WBC 0  SIRS Score Sum  1   Patient found to be hypertensive, scheduled Coreg given, md aware

## 2021-05-01 ENCOUNTER — Telehealth: Payer: Self-pay | Admitting: Psychiatry

## 2021-05-01 DIAGNOSIS — J189 Pneumonia, unspecified organism: Secondary | ICD-10-CM | POA: Diagnosis not present

## 2021-05-01 LAB — RESP PANEL BY RT-PCR (FLU A&B, COVID) ARPGX2
Influenza A by PCR: NEGATIVE
Influenza B by PCR: NEGATIVE
SARS Coronavirus 2 by RT PCR: NEGATIVE

## 2021-05-01 MED ORDER — INFLUENZA VAC SPLIT QUAD 0.5 ML IM SUSY: 0.5000 mL | PREFILLED_SYRINGE | Freq: Once | INTRAMUSCULAR | Status: DC

## 2021-05-01 MED ORDER — INFLUENZA VAC A&B SA ADJ QUAD 0.5 ML IM PRSY
0.5000 mL | PREFILLED_SYRINGE | INTRAMUSCULAR | Status: AC
  Administered 2021-05-02: 0.5 mL via INTRAMUSCULAR
  Filled 2021-05-01: qty 0.5

## 2021-05-01 NOTE — Plan of Care (Signed)
  Problem: Education: Goal: Knowledge of General Education information will improve Description: Including pain rating scale, medication(s)/side effects and non-pharmacologic comfort measures Outcome: Progressing   Problem: Health Behavior/Discharge Planning: Goal: Ability to manage health-related needs will improve Outcome: Progressing   Problem: Activity: Goal: Risk for activity intolerance will decrease Outcome: Progressing   Problem: Elimination: Goal: Will not experience complications related to bowel motility Outcome: Progressing Goal: Will not experience complications related to urinary retention Outcome: Progressing   Problem: Safety: Goal: Ability to remain free from injury will improve Outcome: Progressing   Problem: Skin Integrity: Goal: Risk for impaired skin integrity will decrease Outcome: Progressing   Problem: Activity: Goal: Ability to tolerate increased activity will improve Outcome: Progressing   Problem: Clinical Measurements: Goal: Ability to maintain a body temperature in the normal range will improve Outcome: Progressing   Problem: Respiratory: Goal: Ability to maintain adequate ventilation will improve Outcome: Progressing Goal: Ability to maintain a clear airway will improve Outcome: Progressing

## 2021-05-01 NOTE — Telephone Encounter (Signed)
Referral for neuropsych testing sent to [facility]. Phone: [phone].

## 2021-05-01 NOTE — Progress Notes (Signed)
Confused, no complaints.  Pleasant PROGRESS NOTE  [name] EXM:[id number] DOB: 1940/08/12 DOA: 04/27/2021 PCP: Orpah Melter, MD   LOS: 3 days   Brief Narrative / Interim history: 80 year old female with dementia, HTN, aortic aneurysm, anxiety/depression, chronic urinary frequency who comes into the hospital and is admitted after being found down with concern for a fall.  On admission she had bilateral lower extremity weakness.  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  The lower extremity weakness seems to be improving  Subjective / 24h Interval events: Feels well today, she has no complaints.  Denies any weakness.  Assessment & Plan: Principal Problem Bilateral Lower Extremity Weakness  -MRI pelvis without acute fx - severe L hip OA, intramuscular edema within inferior aspects of gluteus maximus muscles and origins of gluteus minimus and medius muscles - myositis vs low grade strain ESR, CRP - for concern for myositis on imaging (possibly related to being found down?). MTI T/L spine without acute findings. MRI brain with findings which could be c/w NPH in appropriate scenario. Per neurology if this represents perhaps compressive neuropathy of L femoral nerve in inguinal canal region and related to pain.  Improving, very deconditioned and will need SNF   Active problems Found Down  Concern for Mechanical Fall  Elevated CK -Found in bathroom between wall and toilet. Son notes, she's been having episodes where she's been found down and this was part of reason for her going to [facility] (?she chooses to sit or lie down, rather than falls? - never evidence of trauma before - not sure what this was). Unwitnessed event, unclear if fall -> 8th rib fx suggests trauma/fall. CT head/neck - no acute intracranial abnormality, no acute fx or static subluxation of cervical spine.  Hip plain films without acute fx or dislocation.  MRI of the pelvis with severe L hip OA, femoral head flattening/articular surface collapse, arthritis related joint effusion - symmetric intramuscular edema within inferior aspects of gluteus maximus muscles and the origins of gluteus minimus and medius muscles - myositis vs low grade muscle strain. Plain films of RLE - femur, knee, tib fib unremarkable  Left Lower Lobe Airspace Opacities  Acute Hypoxic Respiratory Failure She's desatting, especially when asleep ? OSA, will need outpatient sleep study CT chest without consolidation, effusion, edema Right 8th Rib Fracture -Pain control Ascending Thoracic Aneurysm -Needs semi annual imaging and CT surgery referral - son notes she's already following up regarding this Dementia-Delirium precautions HTN - monitor Chronic LBBB  Scheduled Meds:  amLODipine  5 mg Oral Daily   carvedilol  12.5 mg Oral BID WC   enoxaparin (LOVENOX) injection  40 mg Subcutaneous Q24H   influenza vac split quadrivalent PF  0.5 mL Intramuscular Once   Continuous Infusions: PRN Meds:.acetaminophen **OR** acetaminophen  Diet Orders (From admission, onward)     Start     Ordered   04/27/21 1751  Diet Heart Room service appropriate? Yes; Fluid consistency: Thin  Diet effective now       Question Answer Comment  Room service appropriate? Yes   Fluid consistency: Thin      04/27/21 1750            DVT prophylaxis: enoxaparin (LOVENOX) injection 40 mg Start: 04/27/21 2200     Code Status: Full Code  Family Communication: Discussed with son  Status is: Inpatient  Remains inpatient appropriate because: SNF  pending  Level of care: Telemetry  Consultants:  none  Procedures:  none  Microbiology  none  Antimicrobials: none    Objective: Vitals:   04/30/21 1349 04/30/21 2049 05/01/21 0500 05/01/21 1321  BP: (!) 156/92 (!) 171/87 (!) 156/92 (!) 155/89  Pulse: 84 77 80 77  Resp: (!) 22 20 18  (!)  22  Temp: 98.4 F (36.9 C) 98 F (36.7 C) 99 F (37.2 C) 98.4 F (36.9 C)  TempSrc: Oral Oral Oral Oral  SpO2: 92% 99% 97% 98%  Weight:      Height:        Intake/Output Summary (Last 24 hours) at 05/01/2021 1331 Last data filed at 05/01/2021 1022 Gross per 24 hour  Intake 520 ml  Output 400 ml  Net 120 ml    Filed Weights   04/27/21 1311 04/27/21 1736  Weight: 81.6 kg 82.7 kg    Examination:  Constitutional: NAD Eyes: Anicteric ENMT: mmm  Neck: normal, supple Respiratory: Clear bilaterally, no wheezing or crackles Cardiovascular: Regular rate and rhythm, no murmurs appreciated, no edema Abdomen: Soft, NT, ND, bowel sounds positive Musculoskeletal: no clubbing / cyanosis.  Skin: No rashes seen Neurologic: No focal deficits   Data Reviewed: I have independently reviewed following labs and imaging studies   CBC: Recent Labs  Lab 04/27/21 1416 04/27/21 1818 04/28/21 0341 04/29/21 0402  WBC 15.6* 10.4 7.2 6.3  NEUTROABS 12.9*  --   --  4.1  HGB 13.6 11.9* 11.1* 10.7*  HCT 40.8 36.5 34.9* 33.8*  MCV 98.1 100.6* 101.5* 103.0*  PLT 268 223 214 355    Basic Metabolic Panel: Recent Labs  Lab 04/27/21 1416 04/27/21 1818 04/28/21 0341 04/29/21 0402  NA 136  --  138 136  K 3.5  --  3.7 3.4*  CL 99  --  104 100  CO2 27  --  30 29  GLUCOSE 119*  --  97 103*  BUN 39*  --  32* 14  CREATININE 0.66 0.58 0.50 0.41*  CALCIUM 9.7  --  8.9 8.7*  MG  --   --   --  1.7  PHOS  --   --   --  2.9    Liver Function Tests: Recent Labs  Lab 04/27/21 1416 04/28/21 0341 04/29/21 0402  AST 40 27 21  ALT 26 21 19   ALKPHOS 64 46 47  BILITOT 0.8 0.6 1.0  PROT 7.3 5.6* 5.5*  ALBUMIN 4.2 3.2* 3.0*    Coagulation Profile: No results for input(s): INR, PROTIME in the last 168 hours. HbA1C: No results for input(s): HGBA1C in the last 72 hours. CBG: Recent Labs  Lab 04/27/21 1357  GLUCAP 112*     Recent Results (from the past 240 hour(s))  Resp Panel by  RT-PCR (Flu A&B, Covid) Nasopharyngeal Swab     Status: None   Collection Time: 04/27/21  4:41 PM   Specimen: Nasopharyngeal Swab; Nasopharyngeal(NP) swabs in vial transport medium  Result Value Ref Range Status   SARS Coronavirus 2 by RT PCR NEGATIVE NEGATIVE Final    Comment: (NOTE) SARS-CoV-2 target nucleic acids are NOT DETECTED.  The SARS-CoV-2 RNA is generally detectable in upper respiratory specimens during the acute phase of infection. The lowest concentration of SARS-CoV-2 viral copies this assay can detect is 138 copies/mL. A negative result does not preclude SARS-Cov-2 infection and should not be used as the sole basis for treatment or other patient management decisions. A negative result may occur with  improper  specimen collection/handling, submission of specimen other than nasopharyngeal swab, presence of viral mutation(s) within the areas targeted by this assay, and inadequate number of viral copies(<138 copies/mL). A negative result must be combined with clinical observations, patient history, and epidemiological information. The expected result is Negative.  Fact Sheet for Patients:  EntrepreneurPulse.com.au  Fact Sheet for Healthcare Providers:  IncredibleEmployment.be  This test is no t yet approved or cleared by the [place] FDA and  has been authorized for detection and/or diagnosis of SARS-CoV-2 by FDA under an Emergency Use Authorization (EUA). This EUA will remain  in effect (meaning this test can be used) for the duration of the COVID-19 declaration under Section 564(b)(1) of the Act, 21 U.S.C.section 360bbb-3(b)(1), unless the authorization is terminated  or revoked sooner.       Influenza A by PCR NEGATIVE NEGATIVE Final   Influenza B by PCR NEGATIVE NEGATIVE Final    Comment: (NOTE) The Xpert Xpress SARS-CoV-2/FLU/RSV plus assay is intended as an aid in the diagnosis of influenza from Nasopharyngeal swab  specimens and should not be used as a sole basis for treatment. Nasal washings and aspirates are unacceptable for Xpert Xpress SARS-CoV-2/FLU/RSV testing.  Fact Sheet for Patients: EntrepreneurPulse.com.au  Fact Sheet for Healthcare Providers: IncredibleEmployment.be  This test is not yet approved or cleared by the [place] FDA and has been authorized for detection and/or diagnosis of SARS-CoV-2 by FDA under an Emergency Use Authorization (EUA). This EUA will remain in effect (meaning this test can be used) for the duration of the COVID-19 declaration under Section 564(b)(1) of the Act, 21 U.S.C. section 360bbb-3(b)(1), unless the authorization is terminated or revoked.  Performed at [facility], [place] [address], [place], [place] [address]       Radiology Studies: No results found.   Marzetta Board, MD, PhD [facility]  Between 7 am - 7 pm I am available, please contact me via Amion (for emergencies) or Securechat (non urgent messages)  Between 7 pm - 7 am I am not available, please contact night coverage MD/APP via Amion

## 2021-05-01 NOTE — Progress Notes (Signed)
Physical Therapy Treatment Patient Details Name: [name] MRN: [id number] DOB: 09-18-1940 Today's Date: 05/01/2021   History of Present Illness 80 y.o. female with medical history significant of dementia, hypertension, skin cancer, and multiple other medical issues presenting after being found down in the bathroom.    PT Comments    General Comments: AxO x 2 with short term memory deficits and delayed response.  Pt on RA sats 95%.  Assisted OOB to amb to bathroom.  General bed mobility comments: Mod Assist for upper body and increased difficulty self scooting to EOB. General transfer comment: required Max Assist to rise from EOB also present with posterior lean.  Pt present with decreased functional strength B UE's.  Audible crepitur L hip.  Also assisted with a toilet transfers which pt required increased assist from lower level. General Gait Details: limited amb distance of 22 feet to and from bathroom.  Very slow gait with slight forward flex posture.  Increased assist/safety with turns and esp backward gait to toilet.  Poor self correction to midline present with posterior LOB and poor self correction.  HIGH FALL RISK.  RA during activity 92% with HR 80.   Pt will need ST Rehab at SNF prior to safely returning home.     Recommendations for follow up therapy are one component of a multi-disciplinary discharge planning process, led by the attending physician.  Recommendations may be updated based on patient status, additional functional criteria and insurance authorization.  Follow Up Recommendations  Skilled nursing-short term rehab (<3 hours/day)     Assistance Recommended at Discharge    Equipment Recommendations  None recommended by PT    Recommendations for Other Services       Precautions / Restrictions Precautions Precautions: Fall Precaution Comments: bad B shoulders, L hip     Mobility  Bed Mobility Overal bed mobility: Needs Assistance Bed Mobility: Supine to  Sit     Supine to sit: Mod assist     General bed mobility comments: Mod Assist for upper body and increased difficulty self scooting to EOB.    Transfers   Equipment used: Rolling walker (2 wheels)               General transfer comment: required Max Assist to rise from EOB also present with posterior lean.  Pt present with decreased functional strength B UE's.  Audible crepitur L hip.  Also assisted with a toilet transfers which pt required increased assist from lower level.    Ambulation/Gait Ambulation/Gait assistance: Min assist Gait Distance (Feet): 22 Feet Assistive device: Rolling walker (2 wheels) Gait Pattern/deviations: Step-to pattern;Decreased stance time - left;Trunk flexed Gait velocity: decreased   General Gait Details: limited amb distance of 22 feet to and from bathroom.  Very slow gait with slight forward flex posture.  Increased assist/safety with turns and esp backward gait to toilet.  Poor self correction to midline present with posterior LOB and poor self correction.  HIGH FALL RISK.   Stairs             Wheelchair Mobility    Modified Rankin (Stroke Patients Only)       Balance                                            Cognition Arousal/Alertness: Awake/alert   Overall Cognitive Status: Impaired/Different from baseline  General Comments: AxO x 2 with short term memory deficits and delayed responce        Exercises      General Comments        Pertinent Vitals/Pain Pain Assessment: Faces Faces Pain Scale: Hurts little more Pain Location: L hip abd buttocks Pain Descriptors / Indicators: Guarding;Grimacing Pain Intervention(s): Monitored during session;Repositioned    Home Living                          Prior Function            PT Goals (current goals can now be found in the care plan section) Progress towards PT goals: Progressing toward  goals    Frequency    Min 2X/week      PT Plan Current plan remains appropriate    Co-evaluation              AM-PAC PT "6 Clicks" Mobility   Outcome Measure  Help needed turning from your back to your side while in a flat bed without using bedrails?: A Lot Help needed moving from lying on your back to sitting on the side of a flat bed without using bedrails?: A Lot Help needed moving to and from a bed to a chair (including a wheelchair)?: Total Help needed standing up from a chair using your arms (e.g., wheelchair or bedside chair)?: Total Help needed to walk in hospital room?: Total Help needed climbing 3-5 steps with a railing? : Total 6 Click Score: 8    End of Session   Activity Tolerance: Patient limited by fatigue Patient left: in chair;with call bell/phone within reach;with chair alarm set;with family/visitor present Nurse Communication: Mobility status (Reported to NT pt had a large BM in bathoom) PT Visit Diagnosis: Difficulty in walking, not elsewhere classified (R26.2);Pain;History of falling (Z91.81);Muscle weakness (generalized) (M62.81) Pain - Right/Left: Left Pain - part of body: Leg     Time: 2992-4268 PT Time Calculation (min) (ACUTE ONLY): 30 min  Charges:  $Gait Training: 8-22 mins $Therapeutic Activity: 8-22 mins                     {Wladyslawa Disbro  PTA Acute  Rehabilitation Services Pager      [phone] Office      [phone]

## 2021-05-01 NOTE — TOC Progression Note (Signed)
Transition of Care [facility]) - Progression Note    Patient Details  Name: [name] MRN: [id number] Date of Birth: Jul 12, 1940  [facility] CM/SW Contact  Purcell Mouton, RN Phone Number: 05/01/2021, 2:49 PM  Clinical Narrative:    Insurance authorization was started for pt to go to [facility] in the AM.    Expected Discharge Plan: [place] Barriers to Discharge: No Barriers Identified  Expected Discharge Plan and Services Expected Discharge Plan: [place]   Discharge Planning Services: CM Consult   Living arrangements for the past 2 months: [place]                                       Social Determinants of Health (SDOH) Interventions    Readmission Risk Interventions No flowsheet data found.

## 2021-05-02 DIAGNOSIS — J189 Pneumonia, unspecified organism: Secondary | ICD-10-CM | POA: Diagnosis not present

## 2021-05-02 NOTE — Care Management Important Message (Signed)
Medicare IM printed for W/L Social Work to give to the patient. 

## 2021-05-02 NOTE — Discharge Summary (Signed)
Physician Discharge Summary  [name] WRU:[id number] DOB: 1940/07/17 DOA: 04/27/2021  PCP: Orpah Melter, MD  Admit date: 04/27/2021 Discharge date: 05/02/2021  Admitted From: ILF Disposition:  SNF  Recommendations for Outpatient Follow-up:  Follow up with PCP in 1-2 weeks Please obtain BMP/CBC in one week  Home Health: none Equipment/Devices: none  Discharge Condition: stable CODE STATUS: Full code Diet recommendation: regular  HPI: Per admitting MD, [name] is a 80 y.o. female with medical history significant of dementia, hypertension, skin cancer, and multiple other medical issues presenting after being found down in the bathroom. Hx limited due to pt dementia.  Unwitnessed event.  Per son, got phone call that she was found down in bathroom and needed help up.  Sent to ED subsequently.  He notes that part of reason she went to [facility] was that she'd often been found down at home.  Not clear that she'd been falling, instead, seemed like she may have been sitting down and then not able to get up (as no apparent injuries).  At baseline, can get around with walker.  She's seeing neurology for dementia workup, he'd been looking into ALF level of care.  She's been seen in the ED, imaging concerning for pneumonia, also with elevated CK, they requested admission for IVF and additional workup.   Hospital Course / Discharge diagnoses: Principal Problem Bilateral Lower Extremity Weakness  -MRI pelvis without acute fx - severe L hip OA, intramuscular edema within inferior aspects of gluteus maximus muscles and origins of gluteus minimus and medius muscles - myositis vs low grade strain ESR, CRP - for concern for myositis on imaging (possibly related to being found down?). MTI T/L spine without acute findings. MRI brain with findings which could be c/w NPH in appropriate scenario. Per neurology if this represents perhaps compressive neuropathy of L femoral nerve in inguinal  canal region and related to pain.  Improving, very deconditioned and will need SNF   Active problems Found Down  Concern for Mechanical Fall  Elevated CK -Found in bathroom between wall and toilet. Son notes, she's been having episodes where she's been found down and this was part of reason for her going to [facility] (?she chooses to sit or lie down, rather than falls? - never evidence of trauma before - not sure what this was). Unwitnessed event, unclear if fall -> 8th rib fx suggests trauma/fall. CT head/neck - no acute intracranial abnormality, no acute fx or static subluxation of cervical spine. Hip plain films without acute fx or dislocation.  MRI of the pelvis with severe L hip OA, femoral head flattening/articular surface collapse, arthritis related joint effusion - symmetric intramuscular edema within inferior aspects of gluteus maximus muscles and the origins of gluteus minimus and medius muscles - myositis vs low grade muscle strain. Plain films of RLE - femur, knee, tib fib unremarkable  Left Lower Lobe Airspace Opacities  Acute Hypoxic Respiratory Failure She's desatting, especially when asleep ? OSA, will need outpatient sleep study CT chest without consolidation, effusion, edema Right 8th Rib Fracture -Pain control Ascending Thoracic Aneurysm -Needs semi annual imaging and CT surgery referral - son notes she's already following up regarding this Dementia-Delirium precautions HTN - monitor Chronic LBBB  Sepsis ruled out   Discharge Instructions   Allergies as of 05/02/2021       Reactions   Molds & Smuts    Other Other (See Comments)   Dust mites - Upper respiratory congestion   Statins  myalgias        Medication List     TAKE these medications    amLODipine 5 MG tablet Commonly known as: NORVASC Take 1 tablet (5 mg total) by mouth daily.   aspirin 325 MG tablet Take 650 mg by mouth every 6 (six) hours as needed for mild pain.   carvedilol 12.5 MG  tablet Commonly known as: COREG TAKE 1 TABLET BY MOUTH TWICE A DAY WITH FOOD   levocetirizine 5 MG tablet Commonly known as: XYZAL Take 5 mg by mouth daily as needed for allergies.   rosuvastatin 10 MG tablet Commonly known as: CRESTOR Take 10 mg by mouth daily.       Consultations: Neurology   Procedures/Studies:  DG Chest 2 View  Result Date: 04/27/2021 CLINICAL DATA:  Fall.  History of ascending aortic aneurysm. EXAM: CHEST - 2 VIEW COMPARISON:  Chest radiograph 12/06/2020; CT chest 06/11/2020 FINDINGS: New retrocardiac patchy airspace opacities, best appreciated on lateral view overlying the distal thoracic spine. Trace left pleural fluid. No pneumothorax. Heart is normal in size. Stable cardiomediastinal silhouette reflecting known ascending aortic aneurysm. No acute osseous abnormality. Severe osteoarthritis of the glenohumeral joints. IMPRESSION: 1. New retrocardiac left lower lobe airspace opacities, concerning for pneumonia. 2. Trace left pleural fluid. 3. Stable ascending aortic aneurysm. Aortic aneurysm NOS (ICD10-I71.9). 4. No acute osseous abnormality status post fall. Electronically Signed   By: Ileana Roup M.D.   On: 04/27/2021 14:24   DG Knee 1-2 Views Right  Result Date: 04/27/2021 CLINICAL DATA:  Found down in the bathroom today.  Dementia. EXAM: RIGHT KNEE - 1-2 VIEW COMPARISON:  None. FINDINGS: No acute fracture or subluxation. Mild degenerative changes of the LATERAL and patellofemoral compartments. No joint effusion. IMPRESSION: No evidence for acute  abnormality. Electronically Signed   By: Nolon Nations M.D.   On: 04/27/2021 20:07   DG Tibia/Fibula Right  Result Date: 04/27/2021 CLINICAL DATA:  Found down in the bathroom.  RIGHT leg pain. EXAM: RIGHT TIBIA AND FIBULA - 2 VIEW COMPARISON:  None. FINDINGS: There is no evidence of fracture or other focal bone lesions. Soft tissues are unremarkable. IMPRESSION: Negative. Electronically Signed   By: Nolon Nations M.D.   On: 04/27/2021 20:08   CT HEAD WO CONTRAST (5MM)  Result Date: 04/27/2021 CLINICAL DATA:  Head trauma, minor (Age >= 65y) EXAM: CT HEAD WITHOUT CONTRAST CT CERVICAL SPINE WITHOUT CONTRAST TECHNIQUE: Multidetector CT imaging of the head and cervical spine was performed following the standard protocol without intravenous contrast. Multiplanar CT image reconstructions of the cervical spine were also generated. COMPARISON:  January 12, 2019 FINDINGS: CT HEAD FINDINGS Brain: No evidence of acute infarction, hemorrhage, hydrocephalus, extra-axial collection or mass lesion/mass effect. Periventricular white matter hypodensities consistent with sequela of chronic microvascular ischemic disease. Global parenchymal volume loss Vascular: Vascular calcifications. Skull: Normal. Negative for fracture or focal lesion. Sinuses/Orbits: No acute finding. Other: None. CT CERVICAL SPINE FINDINGS Alignment: Normal. Skull base and vertebrae: No acute fracture. No primary bone lesion or focal pathologic process. Soft tissues and spinal canal: No prevertebral fluid or swelling. No visible canal hematoma. Disc levels: Mild intervertebral disc space height loss at C4-5. Bilateral facet arthropathy without high-grade canal narrowing. Upper chest: Negative. Other: None IMPRESSION: 1.  No acute intracranial abnormality. 2.  No acute fracture or static subluxation of the cervical spine. Electronically Signed   By: Valentino Saxon M.D.   On: 04/27/2021 14:01   CT Angio Chest Pulmonary Embolism (PE) W  or WO Contrast  Result Date: 04/27/2021 CLINICAL DATA:  Found down.  Pulmonary embolus suspected. EXAM: CT ANGIOGRAPHY CHEST WITH CONTRAST TECHNIQUE: Multidetector CT imaging of the chest was performed using the standard protocol during bolus administration of intravenous contrast. Multiplanar CT image reconstructions and MIPs were obtained to evaluate the vascular anatomy. CONTRAST:  80m OMNIPAQUE IOHEXOL 350 MG/ML SOLN  COMPARISON:  Two-view chest today. FINDINGS: Cardiovascular: Heart size is normal. There are coronary artery calcifications. There is minimal atherosclerotic calcification of the thoracic aorta. The ascending aorta is aneurysmal, measuring 4.9 centimeters. Pulmonary arteries are well opacified and there is no evidence for acute pulmonary embolus. Mediastinum/Nodes: The visualized portion of the thyroid gland has a normal appearance. Esophagus is unremarkable. No significant mediastinal, hilar, or axillary adenopathy. Lungs/Pleura: There is minimal atelectasis at the lung bases. No consolidations. No pleural effusions or evidence for pulmonary edema. Upper Abdomen: Liver is diffusely low attenuation consistent with hepatic steatosis. Gallbladder is present. Musculoskeletal: Convex RIGHT scoliosis. No acute vertebral fracture. There is an acute fracture of the RIGHT 8th rib. Remote fracture of the RIGHT LATERAL third rib. Review of the MIP images confirms the above findings. IMPRESSION: 1. Technically adequate exam showing no acute pulmonary embolus. 2. Ascending thoracic aneurysm 4.9 centimeters. Ascending thoracic aortic aneurysm. Recommend semi-annual imaging followup by CTA or MRA and referral to cardiothoracic surgery if not already obtained. This recommendation follows 2010 ACCF/AHA/AATS/ACR/ASA/SCA/SCAI/SIR/STS/SVM Guidelines for the Diagnosis and Management of Patients With Thoracic Aortic Disease. Circulation. 2010; 121:: [id number] Aortic aneurysm NOS (ICD10-I71.9) Aortic aneurysm NOS (ICD10-I71.9). 3.  Aortic Atherosclerosis (ICD10-I70.0). 4. Acute fracture of the RIGHT 8th rib.  No pneumothorax. 5. Hepatic steatosis. Electronically Signed   By: ENolon NationsM.D.   On: 04/27/2021 20:35   CT Cervical Spine Wo Contrast  Result Date: 04/27/2021 CLINICAL DATA:  Head trauma, minor (Age >= 65y) EXAM: CT HEAD WITHOUT CONTRAST CT CERVICAL SPINE WITHOUT CONTRAST TECHNIQUE: Multidetector CT imaging of the  head and cervical spine was performed following the standard protocol without intravenous contrast. Multiplanar CT image reconstructions of the cervical spine were also generated. COMPARISON:  January 12, 2019 FINDINGS: CT HEAD FINDINGS Brain: No evidence of acute infarction, hemorrhage, hydrocephalus, extra-axial collection or mass lesion/mass effect. Periventricular white matter hypodensities consistent with sequela of chronic microvascular ischemic disease. Global parenchymal volume loss Vascular: Vascular calcifications. Skull: Normal. Negative for fracture or focal lesion. Sinuses/Orbits: No acute finding. Other: None. CT CERVICAL SPINE FINDINGS Alignment: Normal. Skull base and vertebrae: No acute fracture. No primary bone lesion or focal pathologic process. Soft tissues and spinal canal: No prevertebral fluid or swelling. No visible canal hematoma. Disc levels: Mild intervertebral disc space height loss at C4-5. Bilateral facet arthropathy without high-grade canal narrowing. Upper chest: Negative. Other: None IMPRESSION: 1.  No acute intracranial abnormality. 2.  No acute fracture or static subluxation of the cervical spine. Electronically Signed   By: SValentino SaxonM.D.   On: 04/27/2021 14:01   MR BRAIN WO CONTRAST  Result Date: 04/28/2021 CLINICAL DATA:  Neuro deficit, acute, stroke suspected. EXAM: MRI HEAD WITHOUT CONTRAST TECHNIQUE: Multiplanar, multiecho pulse sequences of the brain and surrounding structures were obtained without intravenous contrast. COMPARISON:  Head CT April 27, 2021; MRI of the brain August 21, 2020. FINDINGS: Brain: No acute infarction, hemorrhage, extra-axial collection or mass lesion. Scattered and confluent foci of T2 hyperintensity are seen within the white matter of the cerebral hemispheres, nonspecific, most likely related to chronic small vessel ischemia. Remote lacunar infarct in  the left corona radiata. Prominence of the anterior frontal sulci and sylvian  fissures with relative preservation of the high convexity sulci and prominence of the supratentorial ventricles, may be seen in the setting of normal pressure hydrocephalus in the appropriate clinical scenario. Moderate parenchymal volume loss, more pronounced in the anterior temporal and frontal regions. Vascular: Normal flow voids. Skull and upper cervical spine: Normal marrow signal. Sinuses/Orbits: Negative. Other: None. IMPRESSION: 1. No acute intracranial abnormality. 2. Moderate chronic microvascular ischemic changes of the white matter. 3. Prominence of the anterior frontal sulci and sylvian fissures with relative preservation of the high convexity sulci and prominence of the supratentorial ventricles, may be seen in the setting of normal pressure hydrocephalus in the appropriate clinical scenario. Electronically Signed   By: Pedro Earls M.D.   On: 04/28/2021 17:10   MR THORACIC SPINE WO CONTRAST  Result Date: 04/29/2021 CLINICAL DATA:  Bilateral weakness EXAM: MRI THORACIC SPINE WITHOUT CONTRAST TECHNIQUE: Multiplanar, multisequence MR imaging of the thoracic spine was performed. No intravenous contrast was administered. COMPARISON:  None. FINDINGS: Alignment: No significant listhesis. Mild endplate retropulsion at T12. Vertebrae: Chronic T12 compression fracture. Vertebral body heights are otherwise maintained. No substantial marrow edema. No suspicious osseous lesion. Cord:  No abnormal signal. Paraspinal and other soft tissues: Unremarkable. Disc levels: Mild multilevel degenerative disc disease with minimal disc bulges or protrusions. Mild facet arthropathy. Small facet joint effusions at mid to lower thoracic levels. No high-grade canal or foraminal narrowing at any level. IMPRESSION: Mild degenerative changes without high-grade stenosis. Chronic T12 compression fracture. Electronically Signed   By: Macy Mis M.D.   On: 04/29/2021 15:12   MR LUMBAR SPINE WO  CONTRAST  Result Date: 04/28/2021 CLINICAL DATA:  Low back pain, increased fracture risk EXAM: MRI LUMBAR SPINE WITHOUT CONTRAST TECHNIQUE: Multiplanar, multisequence MR imaging of the lumbar spine was performed. No intravenous contrast was administered. COMPARISON:  None. FINDINGS: Segmentation: Standard segmentation is assumed. The inferior-most fully formed intervertebral disc is labeled L5-S1. Alignment: Levocurvature of the upper lumbar spine with dextrocurvature of the lower lumbar spine. Slight retrolisthesis of L2 on L3 and L3 on L4. Vertebrae: T12 compression fracture with approximately 40% height loss and degenerative Schmorl's node along the superior endplate. No marrow edema to suggest that this fracture is acute. No marrow edema at other lumbar levels to suggest acute fracture or discitis/osteomyelitis. Scattered benign vertebral venous malformations, largest at L2. No suspicious bone lesions. Conus medullaris and cauda equina: Conus extends to the L1-L2 level. Conus and cauda equina appear normal. Paraspinal and other soft tissues: Unremarkable. Disc levels: T12-L1: Mild disc bulging and facet arthropathy without significant canal or foraminal stenosis. L1-L2: Mild disc bulging and facet arthropathy without significant canal or foraminal stenosis. Mild right subarticular recess narrowing. L2-L3: Right eccentric disc bulge with endplate spurring. Mild right greater than left facet arthropathy. Resulting mild right foraminal stenosis and mild right subarticular recess narrowing without significant canal or left foraminal stenosis. L3-L4: Small broad disc bulge with endplate spurring and superimposed left foraminal disc protrusion. Left greater than right facet arthropathy. Resulting moderate left and mild right foraminal stenosis. No significant canal stenosis. L4-L5: Broad disc bulge with endplate spurring. Left greater than right facet arthropathy. Resulting mild-to-moderate left foraminal stenosis  without significant canal or right foraminal stenosis. Mild bilateral subarticular recess narrowing. L5-S1: Mild disc bulging with severe bilateral facet arthropathy. No significant canal or foraminal stenosis. IMPRESSION: 1. Remote appearing T12 compression fracture with approximately 40% height loss.  No marrow edema to suggest acute fracture. 2. At L3-L4, moderate left and mild right foraminal stenosis. 3. At L4-L5, mild-to-moderate left foraminal stenosis and mild bilateral subarticular recess narrowing. 4. At L5-S1, severe bilateral facet arthropathy without significant stenosis. 5. Levocurvature of the upper lumbar spine with dextrocurvature lower lumbar spine. Electronically Signed   By: Margaretha Sheffield M.D.   On: 04/28/2021 17:28   MR PELVIS WO CONTRAST  Result Date: 04/28/2021 CLINICAL DATA:  Pelvic trauma EXAM: MRI PELVIS WITHOUT CONTRAST TECHNIQUE: Multiplanar multisequence MR imaging of the pelvis was performed. No intravenous contrast was administered. COMPARISON:  Radiograph 04/27/2021 FINDINGS: Urinary Tract: Markedly distended bladder. Left posterolateral bladder diverticulum. Bowel:  Sigmoid diverticulosis.  No bowel obstruction. Vascular/Lymphatic: No pathologically enlarged lymph nodes. No significant vascular abnormality seen. Reproductive:  No mass or other significant abnormality. Other:  None. Musculoskeletal: Prior right hip arthroplasty with associated susceptibility artifact. There is no evidence of acute fracture. There is severe left hip degenerative arthritis with subchondral cyst formation, sclerosis, and flattening of femoral head. Severe superolateral joint space narrowing. Small joint effusion which is likely related to hip arthritis. Likely degenerative anterosuperior labral tearing of the left hip. There is multilevel degenerative disc disease in the lower lumbar spine, partially visualized. There is confluent subchondral fat deposition within the right sacrum, compatible  with prior inflammation. There is symmetric intramuscular edema signal within the inferior aspect of the gluteus maximus muscles bilaterally (coronal STIR image 6). There is also intramuscular edema to a lesser degree at the origins of the gluteus medius and minimus muscles bilaterally (coronal stir images 13 and 15). There is peritendinitis and tendinosis of the proximal hamstrings bilaterally. IMPRESSION: No evidence of acute fracture. Severe left hip osteoarthritis with femoral head flattening/articular surface collapse and likely arthritis related joint effusion. Symmetric extensive intramuscular edema within the inferior aspects of the gluteus maximus muscles and to a much lesser degree the origins of the gluteus minimus and medius muscles, compatible with myositis or low-grade muscle strain. Electronically Signed   By: Maurine Simmering M.D.   On: 04/28/2021 08:13   DG HIP UNILAT WITH PELVIS 2-3 VIEWS LEFT  Result Date: 04/27/2021 CLINICAL DATA:  fall EXAM: DG HIP (WITH OR WITHOUT PELVIS) 2-3V LEFT COMPARISON:  None. FINDINGS: Severe osteopenia. Status post RIGHT hip arthroplasty. Severe degenerative changes of the LEFT hip with osseous remodeling of the acetabulum and femoral head. This limits evaluation for subtle fracture. No definitive acute fracture or dislocation noted within the limitations of this exam. Tubal ligation clips. Sacrum poorly assessed due to overlapping bowel contents. IMPRESSION: No definitive acute fracture or dislocation; however evaluation is limited by marked osteopenia and degenerative changes of the LEFT hip. If persistent concern for nondisplaced hip or pelvic fracture, recommend dedicated pelvic MRI. Electronically Signed   By: Valentino Saxon M.D.   On: 04/27/2021 16:20   DG FEMUR, MIN 2 VIEWS RIGHT  Result Date: 04/27/2021 CLINICAL DATA:  Found down in the bathroom today. Dementia. RIGHT leg pain. EXAM: RIGHT FEMUR 2 VIEWS COMPARISON:  None. FINDINGS: Status post RIGHT  hip arthroplasty. Hardware is intact and located. There is no acute fracture. Mild degenerative changes at the knee. IMPRESSION: No evidence for acute  abnormality.  Postoperative changes. Electronically Signed   By: Nolon Nations M.D.   On: 04/27/2021 20:07     Subjective: - no chest pain, shortness of breath, no abdominal pain, nausea or vomiting.   Discharge Exam: BP (!) 151/83 (BP Location: Right Arm)  Pulse 82   Temp 98.8 F (37.1 C) (Oral)   Resp 16   Ht 5' (1.524 m)   Wt 82.7 kg   SpO2 95%   BMI 35.61 kg/m   General: Pt is alert, awake, not in acute distress Cardiovascular: RRR, S1/S2 +, no rubs, no gallops Respiratory: CTA bilaterally, no wheezing, no rhonchi Abdominal: Soft, NT, ND, bowel sounds + Extremities: no edema, no cyanosis   The results of significant diagnostics from this hospitalization (including imaging, microbiology, ancillary and laboratory) are listed below for reference.     Microbiology: Recent Results (from the past 240 hour(s))  Resp Panel by RT-PCR (Flu A&B, Covid) Nasopharyngeal Swab     Status: None   Collection Time: 04/27/21  4:41 PM   Specimen: Nasopharyngeal Swab; Nasopharyngeal(NP) swabs in vial transport medium  Result Value Ref Range Status   SARS Coronavirus 2 by RT PCR NEGATIVE NEGATIVE Final    Comment: (NOTE) SARS-CoV-2 target nucleic acids are NOT DETECTED.  The SARS-CoV-2 RNA is generally detectable in upper respiratory specimens during the acute phase of infection. The lowest concentration of SARS-CoV-2 viral copies this assay can detect is 138 copies/mL. A negative result does not preclude SARS-Cov-2 infection and should not be used as the sole basis for treatment or other patient management decisions. A negative result may occur with  improper specimen collection/handling, submission of specimen other than nasopharyngeal swab, presence of viral mutation(s) within the areas targeted by this assay, and inadequate number  of viral copies(<138 copies/mL). A negative result must be combined with clinical observations, patient history, and epidemiological information. The expected result is Negative.  Fact Sheet for Patients:  EntrepreneurPulse.com.au  Fact Sheet for Healthcare Providers:  IncredibleEmployment.be  This test is no t yet approved or cleared by the [place] FDA and  has been authorized for detection and/or diagnosis of SARS-CoV-2 by FDA under an Emergency Use Authorization (EUA). This EUA will remain  in effect (meaning this test can be used) for the duration of the COVID-19 declaration under Section 564(b)(1) of the Act, 21 U.S.C.section 360bbb-3(b)(1), unless the authorization is terminated  or revoked sooner.       Influenza A by PCR NEGATIVE NEGATIVE Final   Influenza B by PCR NEGATIVE NEGATIVE Final    Comment: (NOTE) The Xpert Xpress SARS-CoV-2/FLU/RSV plus assay is intended as an aid in the diagnosis of influenza from Nasopharyngeal swab specimens and should not be used as a sole basis for treatment. Nasal washings and aspirates are unacceptable for Xpert Xpress SARS-CoV-2/FLU/RSV testing.  Fact Sheet for Patients: EntrepreneurPulse.com.au  Fact Sheet for Healthcare Providers: IncredibleEmployment.be  This test is not yet approved or cleared by the [place] FDA and has been authorized for detection and/or diagnosis of SARS-CoV-2 by FDA under an Emergency Use Authorization (EUA). This EUA will remain in effect (meaning this test can be used) for the duration of the COVID-19 declaration under Section 564(b)(1) of the Act, 21 U.S.C. section 360bbb-3(b)(1), unless the authorization is terminated or revoked.  Performed at [facility], [place] [address] [place], [place] [address]   Resp Panel by RT-PCR (Flu A&B, Covid) Nasopharyngeal Swab     Status: None   Collection Time:  05/01/21 12:22 PM   Specimen: Nasopharyngeal Swab; Nasopharyngeal(NP) swabs in vial transport medium  Result Value Ref Range Status   SARS Coronavirus 2 by RT PCR NEGATIVE NEGATIVE Final    Comment: (NOTE) SARS-CoV-2 target nucleic acids are NOT DETECTED.  The SARS-CoV-2 RNA is  generally detectable in upper respiratory specimens during the acute phase of infection. The lowest concentration of SARS-CoV-2 viral copies this assay can detect is 138 copies/mL. A negative result does not preclude SARS-Cov-2 infection and should not be used as the sole basis for treatment or other patient management decisions. A negative result may occur with  improper specimen collection/handling, submission of specimen other than nasopharyngeal swab, presence of viral mutation(s) within the areas targeted by this assay, and inadequate number of viral copies(<138 copies/mL). A negative result must be combined with clinical observations, patient history, and epidemiological information. The expected result is Negative.  Fact Sheet for Patients:  EntrepreneurPulse.com.au  Fact Sheet for Healthcare Providers:  IncredibleEmployment.be  This test is no t yet approved or cleared by the [place] FDA and  has been authorized for detection and/or diagnosis of SARS-CoV-2 by FDA under an Emergency Use Authorization (EUA). This EUA will remain  in effect (meaning this test can be used) for the duration of the COVID-19 declaration under Section 564(b)(1) of the Act, 21 U.S.C.section 360bbb-3(b)(1), unless the authorization is terminated  or revoked sooner.       Influenza A by PCR NEGATIVE NEGATIVE Final   Influenza B by PCR NEGATIVE NEGATIVE Final    Comment: (NOTE) The Xpert Xpress SARS-CoV-2/FLU/RSV plus assay is intended as an aid in the diagnosis of influenza from Nasopharyngeal swab specimens and should not be used as a sole basis for treatment. Nasal washings  and aspirates are unacceptable for Xpert Xpress SARS-CoV-2/FLU/RSV testing.  Fact Sheet for Patients: EntrepreneurPulse.com.au  Fact Sheet for Healthcare Providers: IncredibleEmployment.be  This test is not yet approved or cleared by the [place] FDA and has been authorized for detection and/or diagnosis of SARS-CoV-2 by FDA under an Emergency Use Authorization (EUA). This EUA will remain in effect (meaning this test can be used) for the duration of the COVID-19 declaration under Section 564(b)(1) of the Act, 21 U.S.C. section 360bbb-3(b)(1), unless the authorization is terminated or revoked.  Performed at [facility], [place] [address], [place], [place] [address]      Labs: Basic Metabolic Panel: Recent Labs  Lab 04/27/21 1416 04/27/21 1818 04/28/21 0341 04/29/21 0402  NA 136  --  138 136  K 3.5  --  3.7 3.4*  CL 99  --  104 100  CO2 27  --  30 29  GLUCOSE 119*  --  97 103*  BUN 39*  --  32* 14  CREATININE 0.66 0.58 0.50 0.41*  CALCIUM 9.7  --  8.9 8.7*  MG  --   --   --  1.7  PHOS  --   --   --  2.9   Liver Function Tests: Recent Labs  Lab 04/27/21 1416 04/28/21 0341 04/29/21 0402  AST 40 27 21  ALT 26 21 19   ALKPHOS 64 46 47  BILITOT 0.8 0.6 1.0  PROT 7.3 5.6* 5.5*  ALBUMIN 4.2 3.2* 3.0*   CBC: Recent Labs  Lab 04/27/21 1416 04/27/21 1818 04/28/21 0341 04/29/21 0402  WBC 15.6* 10.4 7.2 6.3  NEUTROABS 12.9*  --   --  4.1  HGB 13.6 11.9* 11.1* 10.7*  HCT 40.8 36.5 34.9* 33.8*  MCV 98.1 100.6* 101.5* 103.0*  PLT 268 223 214 195   CBG: Recent Labs  Lab 04/27/21 1357  GLUCAP 112*   Hgb A1c No results for input(s): HGBA1C in the last 72 hours. Lipid Profile No results for input(s): CHOL, HDL, LDLCALC, TRIG, CHOLHDL,  LDLDIRECT in the last 72 hours. Thyroid function studies No results for input(s): TSH, T4TOTAL, T3FREE, THYROIDAB in the last 72 hours.  Invalid input(s):  FREET3 Urinalysis    Component Value Date/Time   COLORURINE AMBER (A) 04/27/2021 1416   APPEARANCEUR HAZY (A) 04/27/2021 1416   LABSPEC 1.027 04/27/2021 1416   PHURINE 5.0 04/27/2021 1416   GLUCOSEU NEGATIVE 04/27/2021 1416   HGBUR NEGATIVE 04/27/2021 1416   [place] 04/27/2021 1416   KETONESUR 20 (A) 04/27/2021 1416   PROTEINUR >=300 (A) 04/27/2021 1416   NITRITE NEGATIVE 04/27/2021 1416   LEUKOCYTESUR NEGATIVE 04/27/2021 1416    FURTHER DISCHARGE INSTRUCTIONS:   Get Medicines reviewed and adjusted: Please take all your medications with you for your next visit with your Primary MD   Laboratory/radiological data: Please request your Primary MD to go over all hospital tests and procedure/radiological results at the follow up, please ask your Primary MD to get all Hospital records sent to his/her office.   In some cases, they will be blood work, cultures and biopsy results pending at the time of your discharge. Please request that your primary care M.D. goes through all the records of your hospital data and follows up on these results.   Also Note the following: If you experience worsening of your admission symptoms, develop shortness of breath, life threatening emergency, suicidal or homicidal thoughts you must seek medical attention immediately by calling 911 or calling your MD immediately  if symptoms less severe.   You must read complete instructions/literature along with all the possible adverse reactions/side effects for all the Medicines you take and that have been prescribed to you. Take any new Medicines after you have completely understood and accpet all the possible adverse reactions/side effects.    Do not drive when taking Pain medications or sleeping medications (Benzodaizepines)   Do not take more than prescribed Pain, Sleep and Anxiety Medications. It is not advisable to combine anxiety,sleep and pain medications without talking with your primary care  practitioner   Special Instructions: If you have smoked or chewed Tobacco  in the last 2 yrs please stop smoking, stop any regular Alcohol  and or any Recreational drug use.   Wear Seat belts while driving.   Please note: You were cared for by a hospitalist during your hospital stay. Once you are discharged, your primary care physician will handle any further medical issues. Please note that NO REFILLS for any discharge medications will be authorized once you are discharged, as it is imperative that you return to your primary care physician (or establish a relationship with a primary care physician if you do not have one) for your post hospital discharge needs so that they can reassess your need for medications and monitor your lab values.  Time coordinating discharge: 40 minutes  SIGNED:  Marzetta Board, MD, PhD 05/02/2021, 7:31 AM

## 2021-05-02 NOTE — TOC Progression Note (Signed)
Transition of Care [facility]) - Progression Note    Patient Details  Name: [name] MRN: [id number] Date of Birth: 1941-05-02  Transition of Care [facility]) CM/SW Contact  Purcell Mouton, RN Phone Number: 05/02/2021, 10:32 AM  Clinical Narrative:    [name] was called, pt's son and RN are aware.    Expected Discharge Plan: [place] Barriers to Discharge: No Barriers Identified  Expected Discharge Plan and Services Expected Discharge Plan: [place]   Discharge Planning Services: CM Consult   Living arrangements for the past 2 months: [place] Expected Discharge Date: 05/02/21                                     Social Determinants of Health (SDOH) Interventions    Readmission Risk Interventions No flowsheet data found.

## 2021-05-02 NOTE — Progress Notes (Signed)
Pt d/c'd to [facility], Instructions reviewed with son, and report called to [place]. Pt is to report to Room 119. SRP, RN

## 2021-05-02 NOTE — Plan of Care (Signed)
  Problem: Education: Goal: Knowledge of General Education information will improve Description: Including pain rating scale, medication(s)/side effects and non-pharmacologic comfort measures Outcome: Progressing   Problem: Health Behavior/Discharge Planning: Goal: Ability to manage health-related needs will improve Outcome: Progressing   Problem: Activity: Goal: Risk for activity intolerance will decrease Outcome: Progressing   Problem: Elimination: Goal: Will not experience complications related to bowel motility Outcome: Progressing   

## 2021-05-02 NOTE — TOC Progression Note (Signed)
Transition of Care [facility]) - Progression Note    Patient Details  Name: [name] MRN: [id number] Date of Birth: 02/07/1941  [facility]) CM/SW Contact  Purcell Mouton, RN Phone Number: 05/02/2021, 10:35 AM  Clinical Narrative:     Rosalie Gums #[id number].   Expected Discharge Plan: [place] Barriers to Discharge: No Barriers Identified  Expected Discharge Plan and Services Expected Discharge Plan: [place]   Discharge Planning Services: CM Consult   Living arrangements for the past 2 months: [place] Expected Discharge Date: 05/02/21                                     Social Determinants of Health (SDOH) Interventions    Readmission Risk Interventions No flowsheet data found.

## 2021-05-10 ENCOUNTER — Other Ambulatory Visit: Payer: Self-pay | Admitting: Cardiology

## 2021-06-17 DIAGNOSIS — R413 Other amnesia: Secondary | ICD-10-CM | POA: Diagnosis not present

## 2021-07-08 NOTE — Progress Notes (Signed)
° °  CC: memory loss  Follow-up Visit  Last visit: 04/03/21  Brief HPI: 81 year old female with a history of HTN, HLD, anxiety, depression who follows in clinic for memory loss.   MMSE 23 at her last visit. TSH and B12 were wnl, and she was referred to neuropsych for further evaluation.  Interval History: No new concerns since her last visit. She continues to live in an independent living facility with a caregiver who visits every morning. Son visits her multiple times per week and often brings her food.  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 She was recommended to have monitored meals (skilled nursing or assisted living) and possibly 24 hour supervision.  Physical Exam:   Vital Signs: BP 122/84    Pulse 70    SpO2 94%  GENERAL:  well appearing, in no acute distress, alert  SKIN:  Color, texture, turgor normal. No rashes or lesions HEAD:  Normocephalic/atraumatic. RESP: normal respiratory effort MSK:  No gross joint deformities.   NEUROLOGICAL: Mental Status: Alert, oriented to person, place and time, Follows commands, and Speech fluent and appropriate. Cranial Nerves: PERRL, face symmetric, no dysarthria, hearing grossly intact Motor: moves all extremities equally  IMPRESSION: 81 year old female with a history of HTN, HLD, anxiety, and depression who presents for follow up of memory loss. 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 Discussed these findings and recommendations for monitored meals and increased supervision. Son feels she is currently doing well at her independent living facility with him helping manage finances and medications. Offered to start Donepezil, which patient declined at this time. She and her son will discuss this further and let me know if she decides she would like to start it. Dementia resources for [facility] provided.  PLAN: -Discussed safety measures  including monitoring meals and medication management. Son does not have safety concerns with her at an independent living facility at this time -Discussed modification of vascular risk factors including healthy eating and taking blood pressure and cholesterol medications as directed -Dementia resource packet for [facility] provided -Patient would prefer not to start an acetylcholinesterase inhibitor at this time  Follow-up: 1 year  I spent a total of 29 minutes on the date of the service.   Sarah Harold, MD 07/09/21 11:12 AM

## 2021-07-09 ENCOUNTER — Ambulatory Visit: Payer: Medicare PPO | Admitting: Psychiatry

## 2021-07-09 ENCOUNTER — Other Ambulatory Visit: Payer: Self-pay

## 2021-07-09 ENCOUNTER — Encounter: Payer: Self-pay | Admitting: Psychiatry

## 2021-07-09 VITALS — BP 122/84 | HR 70

## 2021-07-09 DIAGNOSIS — G309 Alzheimer's disease, unspecified: Secondary | ICD-10-CM | POA: Diagnosis not present

## 2021-07-09 DIAGNOSIS — F0280 Dementia in other diseases classified elsewhere without behavioral disturbance: Secondary | ICD-10-CM | POA: Diagnosis not present

## 2021-07-09 DIAGNOSIS — F01C3 Vascular dementia, severe, with mood disturbance: Secondary | ICD-10-CM | POA: Diagnosis not present

## 2021-07-09 DIAGNOSIS — F0150 Vascular dementia without behavioral disturbance: Secondary | ICD-10-CM | POA: Insufficient documentation

## 2021-07-09 NOTE — Patient Instructions (Addendum)
Consider starting Donepezil (aricept) to help with memory loss  DEMENTIA OVERVIEW "Dementia" is a general term for when a person has developed difficulties with reasoning, judgment, and memory. People who have dementia usually have some memory loss as well as difficulty in at least one other area, such as: ?Speaking or writing coherently (or understanding what is said or written) ?Recognizing familiar surroundings ?Planning and carrying out complex or multi-step tasks In order to be considered dementia these issues must be severe enough to interfere with a person's independence and daily activities. Dementia can be caused by several diseases that affect the brain. The most common cause is Alzheimer disease. Alzheimer disease is present in approximately 61 to 27 percent of all cases of dementia; other degenerative and/or vascular diseases may be present as well, particularly as a person gets older.   DEMENTIA RISK FACTORS There is no way to predict with certainty who will develop dementia. Each form of dementia has its own risk factors, but most forms have several risk factors in common. Age -- The biggest risk factor for dementia is age: dementia is rare in people younger than 21 years and becomes very common in people older than 42. For example, dementia affects approximately one in six people between 69 and 82 years old, one in three above 71 years, and almost half of people over age 46.  Family history -- Some forms of dementia have a genetic component, meaning that they tend to run in families. Having a close family member with Alzheimer disease increases your chances of developing it. People with a first-degree relative (parent or sibling) with Alzheimer disease have a greater chance of developing the disorder. The risk is probably highest if the family member developed Alzheimer disease at a younger age (less than 73 years old) and is lower if the family member did not get Alzheimer disease until  late in life. However, families that have a very strong genetic tendency toward Alzheimer disease are uncommon.  Other factors -- Studies indicate that high blood pressure, smoking, and diabetes may be risk factors for dementia. Experts are still not sure how treatment for these problems might influence your risk of developing dementia beyond their benefit of reducing stroke risk. Lifestyle factors have also been implicated in dementia. For instance, people who remain physically active, socially connected, and mentally engaged seem less likely to develop dementia than people who do not. These activities may produce more cognitive (mental) reserve or resilience, delaying the emergence of symptoms until an older age.  DEMENTIA SYMPTOMS Each form of dementia can cause difficulty with memory, language, reasoning, and judgment, but the symptoms are often very different from person to person. Symptoms also change over time. The differences between one form of dementia and another may only be recognizable to skilled health care providers who have experience working with people with dementia. Sometimes family members notice changes but mistakenly attribute them to aging.  Is memory loss normal? -- Many people worry that memory problems are related to early Alzheimer disease. However, some problems are normal and just related to aging, and do not signify a progressive dementia. Normal age-related changes often cause minor difficulties with immediate memory, for example, remembering a phone number or a set of directions for a short time. Temporary difficulty recalling proper names, even very familiar ones, is also common with aging. As people age normally, it is common to complain of less efficient and slower processing and learning of new information. Memory changes due to normal  aging are usually mild and do not worsen greatly over time, nor should they interfere with a person's day-to-day functioning.  Early  changes -- The earliest symptoms of Alzheimer disease are gradual and often subtle. Many people and their families first notice difficulty recalling recent events or information. This often emerges as a tendency to repeat stories or questions or to request or require repetition of material to be able to remember. If you find yourself telling an older family member or friend "I told you that earlier" or "You have told me that more than once," you might begin to suspect Alzheimer disease. Other changes can include one or more of the following: ?Difficulties with language (eg, not being able to find the right words for things) ?Difficulty with concentration and reasoning ?Problems with complex tasks like paying bills, cooking, or balancing a checkbook ?Getting lost in a familiar place  Late changes -- As Alzheimer disease progresses, a person's ability to think clearly continues to decline, and any or all of the changes listed above may be more disruptive. In addition, personality and behavioral symptoms can become quite troublesome. These can include: ?Increased anger or hostility, sometimes aggressive behavior; alternatively, some people become depressed or exhibit little interest in their surroundings (called "apathy") ?Sleep problems ?Hallucinations and/or delusions ?Disorientation ?Needing help with basic tasks (such as eating, bathing, and dressing) ?Incontinence (difficulty controlling the bladder and/or bowels)  The number of symptoms, the functions that are impaired, and the speed with which symptoms progress can vary widely from one person to the next. In some people, severe dementia occurs within five years of the diagnosis; for others, the progression can take more than 10 years. Most people with Alzheimer disease do not die from the disease itself, but rather from a secondary illness such as pneumonia, bladder infection, or complications of a fall.  DEMENTIA DIAGNOSIS To diagnose dementia  and identify the type of dementia, health care providers typically rely on the information they can gather by interacting with the person and speaking with their family members. The provider will typically perform memory and other cognitive (thinking) tests to assess the person's degree of difficulty with different types of problems. The results of these tests can then be monitored over time to observe whether functioning stays the same or declines.  Blood tests are usually done to find out if a chemical or hormonal imbalance or vitamin deficiency is contributing to the person's difficulties. Brain scans (usually MRI) are often performed in people with dementia to look for other problems. Sometimes the MRI can also help health care providers identify the type of dementia, since different types can have characteristic brain changes.  SAFETY AND LIFESTYLE ISSUES FOR PEOPLE WITH DEMENTIA A major issue for caregivers is making sure the person with dementia stays safe. Because many people with dementia do not realize that their mental functioning is impaired, they try to continue their day-to-day activities as usual. This can lead to physical danger, and caregivers must help to avoid situations that can threaten the safety of the person or others. The following information applies specifically to people with Alzheimer disease, but much of it is also relevant to people with other forms of dementia.  Medications -- People with Alzheimer disease often have trouble remembering to take medications they are prescribed for other conditions, or they become confused about which medications to take. They are also at increased risk for potentially dangerous side effects from certain medications. Sedatives and certain other drugs (such as some  antihistamines and antidepressants) may carry risk of increasing cognitive impairment. It is important to develop a plan for medication monitoring and safety. People with dementia  often need help taking their medications. It's a good idea to throw away old pill bottles and other medications that are no longer needed.  Driving -- Driving is often one of the first safety issues that arises in people with Alzheimer disease. In people with Alzheimer disease, the risk of having a car accident is significantly increased, especially as the disease progresses. It is best to discuss the issue of driving early, before the symptoms become advanced. Over time, everyone living long enough with dementia will reach a point where driving is too dangerous. Losing the ability to drive can be hard to accept because it represents independence for many people. It can also be challenging if the person does not completely appreciate their impairments in mental functioning or reaction time. In particular, driving at night may carry extra risk. Many people with mild but worrisome impairments will insist that they can safely drive locally or in the daytime. That may be true for the present time; however, people may forget that they have agreed to limitations. In addition, their ability to drive safely, even with restrictions, will deteriorate over time. A roadside driving test is often recommended if there is disagreement or uncertainty about a person's ability to drive. However, if a person with newly diagnosed, mild Alzheimer disease is deemed still able to drive, they will need to be reassessed every six months, with the understanding that driving will eventually no longer be possible. There may be important insurance implications of continued driving when medical records document advice to stop driving.  Cooking -- Cooking is another area that can lead to serious safety concerns and may require help or supervision. Symptoms such as distractibility, forgetfulness, and difficulty following directions can lead to burns, fires, or other injuries. The use of gas cooking appliance raises a particular concern. A  family member may have to ask the utility company to disconnect gas stoves if there is potential for accident or injury. Newer induction electric stoves do not change color when on, and may carry an inadvertent burn risk if a person forgets what they are doing.  Wandering -- As dementia progresses, some people with Alzheimer disease begin to wander. Because restlessness, distractibility, and memory problems are common, a person who wanders may easily become lost. Identification bracelets can help ensure that a lost wanderer gets home. The [employer] provides a "Wandering Support" program that provides ID tags and 24-hour assistance to patients who are preregistered for this program. There are many "locator" applications that allow the person with dementia to wear or carry a GPS device that a family member can track with their cell phone. Regular exercise may decrease the restlessness that can lead to wandering. Exercise is also just good practice to maintain strength, good sleep, and overall health. If wandering continues, wearable alarm systems are available that alert caretakers when the person leaves the home.  Falls -- Falls with secondary injuries are one of the most important causes of additional disability in people with all types of dementia, including Alzheimer disease. Commonly used medications can increase risk of falls and injuries. Hip fracture is a particular concern in older people, as it can lead to serious complications and sometimes even death. To reduce the risk of falls, potential tripping hazards such as loose electrical cords, slippery rugs, and clutter should be removed. Inadvertent hoarding may  develop and pose a safety risk. If the person lives alone, family members or elder services should perform safety inspections of the living space periodically. Medication lists should also be reviewed with your doctor to identify those that might increase the risk of falling. Regular  exercise, especially early in the course of dementia, and use of assistive devices like canes can also help with balance.  DEMENTIA TREATMENT Medications for Alzheimer disease -- Many people with Alzheimer disease will have the option of trying a medication. A trial of medication is usually begun for a period of a few to several weeks while the person is monitored for side effects and response. A health care provider should periodically review all medications to see if they are providing any benefit. It is important to have realistic expectations about the potential benefits of medication therapy in Alzheimer disease. None of these medications cure the disease, and the reality is that over time the person will continue to worsen. When medication does have an effect, the goal is not to stop progression of the disease, but to improve quality of life for the person and their family to the extent possible.  Treatment to slow or delay progression -- There is currently no cure for Alzheimer disease. However, experts are studying treatments in the hope of finding a way to slow the progression of the mental and functional decline, along with scientific efforts to prevent or delay onset.  Treatment of memory problems -- There are several medicines currently available for treating the memory problems associated with Alzheimer disease; they are also used in people with other forms of dementia.  ?Donepezil (brand name: Aricept) This medications allow more of a chemical called acetylcholine to be active in the brain, making up for drops in acetylcholine levels that happen in Alzheimer disease. It can cause side effects such as nausea, vomiting, and diarrhea in some people. It also may cause weight loss in many people. When taken at bedtime, cholinesterase inhibitors can cause very vivid dreams. If there is no improvement in symptoms or side effects are bothersome, the medication should be stopped. Sometimes the  person's symptoms will worsen after treatment is stopped; if this happens, the medication may be started again.  Treatment of behavioral symptoms -- The behavioral symptoms of Alzheimer disease are often more troubling than the cognitive (mental) symptoms. Even in mild cases, agitation, anxiety, and irritability can occur, and generally worsen as Alzheimer disease advances. This can be stressful for the person as well as for their family and caregivers. A combination of medications and behavioral therapy may be helpful. Non-medication therapies are preferred, as virtually all medications used for behavioral symptoms can increase confusion and many are associated with serious side effects and even an increased risk of death.  Depression -- Depression is common, especially in the early phases of dementia. It may be treated with behavioral therapy and/or with medications. The key is to recognize that depression may be playing a role in the person's symptoms. If depression is causing distress, it is worth treating. Potentially helpful medicines include a group of medicines known as selective serotonin reuptake inhibitors, or SSRIs, which are usually preferred over other choices in patients with dementia. Widely used SSRIs include fluoxetine (brand name: Prozac), sertraline (brand name: Zoloft), paroxetine (sample brand names: Brisdelle, Paxil), citalopram (brand name: Celexa), and escitalopram (brand names: Lexapro, Cipralex).   A variety of behavioral therapies are often helpful, do not have the side effects often seen with medications, and may be  recommended for depression. Behavioral therapy involves changing the person's environment (eg, regular exercise, avoiding triggers that cause sadness, socializing with others, engaging in pleasant activities that a person enjoys).  Agitation and aggression -- One of the most difficult issues for caregivers and people with Alzheimer disease is aggressive behavior.  Fortunately, this behavior is not common. However, many family members are reluctant to report aggressive behavior. In some cases, the behavior becomes physically abusive as dementia progresses. Agitation and aggression can be caused by a number of factors, including: ?Confusion, misunderstanding, or disorientation (doctors use the term "delirium" as a general term to describe a state of confusion in which a person does not think or behave normally) ?Frightening or paranoid delusions or hallucinations ?Depression or anxiety ?Sleep disorders, such as reduced sleep or altered sleep/wake cycles ?Certain medical conditions that can cause delirium, such as urinary tract infection or pneumonia ?Being in physical pain or discomfort ?Side effects of certain medications Delusions (ie, believing something that is not real or true) are common in patients with dementia, occurring in up to 30 percent of those with advanced disease. Paranoid delusions are particularly distressing to both the patients and the caregivers: these often include beliefs that someone has invaded the house, that family members have been replaced by impostors, that spouses have been unfaithful, or that personal possessions have been stolen.   Family members should discuss any concerns about aggressive behavior with a health care provider and arrange for help if necessary. The best treatment for these symptoms depends upon what triggers them. As an example, a person who becomes aggressive during periods of confusion might best be treated by talking through the problem, while someone who becomes aggressive during delusions might require medication. Often, behavior improves once an underlying medical condition is treated. Caregivers can learn strategies to help lessen the number of triggers and confrontations.   Sleep problems -- Sleep disorders can be treated with either medicine or behavior changes or both: for example, limiting daytime naps,  increasing physical activity, avoiding caffeine and alcohol in the evening. In some people, medication to help with sleep may be recommended, although these medications almost always have side effects (eg, worsened confusion and increased risk of falls). Maintaining daily rhythms, using artificial lighting when needed during the day, and avoiding bright light exposures during the night may help maintain normal wake-sleep cycles.   COPING WITH DEMENTIA Being diagnosed with any form of dementia can be distressing and overwhelming for the person affected as well as their loved ones. For people with dementia -- It is important for people with early dementia to care for their physical and mental health. This means getting regular checkups, taking medicines if needed, eating a healthy diet, exercising regularly, getting enough sleep, and avoiding activities that may be risky. It is often helpful to talk to others through support groups or a counselor or social worker to discuss any feelings of anxiety, frustration, anger, loneliness, or depression. All of these feelings are normal, and dealing with these feelings can help you to feel more in control of your life and health. It can also help to talk to other people who are going through a similar experience. Another issue to consider is how to tell your family and friends about your diagnosis. Explaining the disease can help others to understand what to expect and how they can help, now and in the future. This can be especially helpful for children and grandchildren, who may not be familiar with the condition. While  many people are able to live alone in the early stages of dementia, you may need help with tasks such as housekeeping, cooking, transportation, and paying bills. If possible, ask a friend or family member for help making plans to deal with these and other issues as dementia progresses. Occupational therapists, and sometimes speech pathologists, can help  to set up your home to minimize confusion and keep you independent for as long as possible. It's also important to establish Power of Attorney and Health Care Proxy statuses early, before a financial or health care crisis happens. This involves completing paperwork to determine who can make decisions on your behalf if needed. In addition, you should discuss your preferences regarding issues that are likely to become important as your dementia worsens, including: ?Is health insurance available, and what does it cover? ?Where will I live? ?Who will make health care and end-of-life decisions if I can't make them for myself? ?Who will pay for care? A number of resources are available to assist in this type of planning.   For caregivers -- Dementia can also impose an enormous burden on families and other caregivers. People with dementia become less able to care for themselves as the condition progresses. If you are caring for someone with dementia, the following may help: ?Make a daily plan and prepare to be flexible if needed. ?Try to be patient when responding to repetitive questions, behaviors, or statements. ?Try not to argue or confront the person with dementia when they express mistaken ideas or facts. Change the subject or gently remind the person of an inaccuracy. Arguing or trying to convince a person of "the truth" is a natural reaction but it can be frustrating to all and can trigger unwanted behavior and feelings. ?Use memory aids such as writing out a list of daily activities, phone numbers, and instructions for usual tasks (ie, the telephone, microwave, etc). It may help if these are posted and easily visible so that the person need not remember to look for the aids. ?Establish calm and consistent nighttime routines to manage behavioral problems, which are often worst at night. Leave a night light on in the person's bedroom. ?Avoid major changes to the home environment (for example,  rearranging furniture). ?Employ safety measures in the home, such as putting locks on medicine cabinets, keeping furniture in the same place to prevent falls, reducing clutter, removing electrical appliances from the bathroom, installing grab bars in the bathroom, and setting the water heater below 120F. ?Help the person with personal care tasks as needed. It is not necessary to bathe every day, although a health care provider should be notified if the person develops sores in the mouth or genitals related to hygiene problems (eg, ill-fitting dentures, urine leakage). ?Speak slowly, present only one idea at a time, and be patient when waiting for responses. ?Encourage physical activity and exercise. Even a daily walk can help prevent physical decline and improve behavioral problems. ?Consider respite care. Respite care can provide a needed break and give you a chance to recharge. This is offered in many communities in the form of in-home care or adult day care. Caregiving can be an all-consuming experience, and it's essential to take time for yourself, take care of your own medical problems, and arrange for breaks when you need them. ?See if your area has a support group for people caring for loved ones with dementia. It can help to talk with other people who understand what you are going through.

## 2021-07-11 ENCOUNTER — Other Ambulatory Visit: Payer: Self-pay | Admitting: Cardiology

## 2021-07-16 DIAGNOSIS — R413 Other amnesia: Secondary | ICD-10-CM | POA: Diagnosis not present

## 2021-07-26 ENCOUNTER — Other Ambulatory Visit: Payer: Self-pay | Admitting: Cardiology

## 2021-07-27 ENCOUNTER — Other Ambulatory Visit: Payer: Self-pay

## 2021-07-27 ENCOUNTER — Inpatient Hospital Stay (HOSPITAL_COMMUNITY): Payer: Medicare PPO

## 2021-07-27 ENCOUNTER — Encounter (HOSPITAL_COMMUNITY): Payer: Self-pay

## 2021-07-27 ENCOUNTER — Emergency Department (HOSPITAL_COMMUNITY): Payer: Medicare PPO

## 2021-07-27 ENCOUNTER — Inpatient Hospital Stay (HOSPITAL_COMMUNITY): Payer: Medicare PPO | Admitting: Anesthesiology

## 2021-07-27 ENCOUNTER — Encounter (HOSPITAL_COMMUNITY)
Admission: EM | Disposition: A | Discharge status: Skilled Nursing Facility | Payer: Self-pay | Source: Home / Self Care | Attending: Internal Medicine

## 2021-07-27 ENCOUNTER — Inpatient Hospital Stay (HOSPITAL_COMMUNITY)
Admission: EM | Admit: 2021-07-27 | Discharge: 2021-08-07 | DRG: 493 | Disposition: A | Discharge status: Skilled Nursing Facility | Payer: Medicare PPO | Attending: Internal Medicine | Admitting: Internal Medicine

## 2021-07-27 DIAGNOSIS — M6282 Rhabdomyolysis: Secondary | ICD-10-CM | POA: Diagnosis not present

## 2021-07-27 DIAGNOSIS — E785 Hyperlipidemia, unspecified: Secondary | ICD-10-CM | POA: Diagnosis not present

## 2021-07-27 DIAGNOSIS — Z888 Allergy status to other drugs, medicaments and biological substances status: Secondary | ICD-10-CM

## 2021-07-27 DIAGNOSIS — E875 Hyperkalemia: Secondary | ICD-10-CM | POA: Diagnosis present

## 2021-07-27 DIAGNOSIS — I96 Gangrene, not elsewhere classified: Secondary | ICD-10-CM | POA: Diagnosis present

## 2021-07-27 DIAGNOSIS — F418 Other specified anxiety disorders: Secondary | ICD-10-CM | POA: Diagnosis not present

## 2021-07-27 DIAGNOSIS — S42325A Nondisplaced transverse fracture of shaft of humerus, left arm, initial encounter for closed fracture: Secondary | ICD-10-CM | POA: Diagnosis not present

## 2021-07-27 DIAGNOSIS — S42302D Unspecified fracture of shaft of humerus, left arm, subsequent encounter for fracture with routine healing: Secondary | ICD-10-CM | POA: Diagnosis not present

## 2021-07-27 DIAGNOSIS — Z66 Do not resuscitate: Secondary | ICD-10-CM | POA: Diagnosis not present

## 2021-07-27 DIAGNOSIS — Z85828 Personal history of other malignant neoplasm of skin: Secondary | ICD-10-CM

## 2021-07-27 DIAGNOSIS — D649 Anemia, unspecified: Secondary | ICD-10-CM | POA: Diagnosis present

## 2021-07-27 DIAGNOSIS — D62 Acute posthemorrhagic anemia: Secondary | ICD-10-CM | POA: Diagnosis not present

## 2021-07-27 DIAGNOSIS — D72829 Elevated white blood cell count, unspecified: Secondary | ICD-10-CM | POA: Diagnosis not present

## 2021-07-27 DIAGNOSIS — E669 Obesity, unspecified: Secondary | ICD-10-CM | POA: Diagnosis present

## 2021-07-27 DIAGNOSIS — Z8249 Family history of ischemic heart disease and other diseases of the circulatory system: Secondary | ICD-10-CM

## 2021-07-27 DIAGNOSIS — M1990 Unspecified osteoarthritis, unspecified site: Secondary | ICD-10-CM | POA: Diagnosis present

## 2021-07-27 DIAGNOSIS — G540 Brachial plexus disorders: Secondary | ICD-10-CM | POA: Diagnosis present

## 2021-07-27 DIAGNOSIS — S42202A Unspecified fracture of upper end of left humerus, initial encounter for closed fracture: Secondary | ICD-10-CM | POA: Diagnosis not present

## 2021-07-27 DIAGNOSIS — Z043 Encounter for examination and observation following other accident: Secondary | ICD-10-CM | POA: Diagnosis not present

## 2021-07-27 DIAGNOSIS — S40812A Abrasion of left upper arm, initial encounter: Secondary | ICD-10-CM | POA: Diagnosis present

## 2021-07-27 DIAGNOSIS — Z6834 Body mass index (BMI) 34.0-34.9, adult: Secondary | ICD-10-CM

## 2021-07-27 DIAGNOSIS — E8770 Fluid overload, unspecified: Secondary | ICD-10-CM | POA: Diagnosis not present

## 2021-07-27 DIAGNOSIS — S5782XA Crushing injury of left forearm, initial encounter: Secondary | ICD-10-CM | POA: Diagnosis not present

## 2021-07-27 DIAGNOSIS — M19012 Primary osteoarthritis, left shoulder: Secondary | ICD-10-CM | POA: Diagnosis present

## 2021-07-27 DIAGNOSIS — S51012A Laceration without foreign body of left elbow, initial encounter: Secondary | ICD-10-CM | POA: Diagnosis not present

## 2021-07-27 DIAGNOSIS — F419 Anxiety disorder, unspecified: Secondary | ICD-10-CM | POA: Diagnosis present

## 2021-07-27 DIAGNOSIS — T79A0XA Compartment syndrome, unspecified, initial encounter: Secondary | ICD-10-CM | POA: Diagnosis present

## 2021-07-27 DIAGNOSIS — T796XXA Traumatic ischemia of muscle, initial encounter: Secondary | ICD-10-CM | POA: Diagnosis present

## 2021-07-27 DIAGNOSIS — N179 Acute kidney failure, unspecified: Secondary | ICD-10-CM | POA: Diagnosis not present

## 2021-07-27 DIAGNOSIS — Z9109 Other allergy status, other than to drugs and biological substances: Secondary | ICD-10-CM

## 2021-07-27 DIAGNOSIS — T79A12A Traumatic compartment syndrome of left upper extremity, initial encounter: Secondary | ICD-10-CM | POA: Diagnosis not present

## 2021-07-27 DIAGNOSIS — Z20822 Contact with and (suspected) exposure to covid-19: Secondary | ICD-10-CM | POA: Diagnosis present

## 2021-07-27 DIAGNOSIS — I7121 Aneurysm of the ascending aorta, without rupture: Secondary | ICD-10-CM | POA: Diagnosis not present

## 2021-07-27 DIAGNOSIS — R52 Pain, unspecified: Secondary | ICD-10-CM

## 2021-07-27 DIAGNOSIS — S42302A Unspecified fracture of shaft of humerus, left arm, initial encounter for closed fracture: Secondary | ICD-10-CM | POA: Diagnosis not present

## 2021-07-27 DIAGNOSIS — S42322A Displaced transverse fracture of shaft of humerus, left arm, initial encounter for closed fracture: Secondary | ICD-10-CM | POA: Diagnosis not present

## 2021-07-27 DIAGNOSIS — E871 Hypo-osmolality and hyponatremia: Secondary | ICD-10-CM | POA: Diagnosis not present

## 2021-07-27 DIAGNOSIS — R0789 Other chest pain: Secondary | ICD-10-CM | POA: Diagnosis present

## 2021-07-27 DIAGNOSIS — S42352A Displaced comminuted fracture of shaft of humerus, left arm, initial encounter for closed fracture: Secondary | ICD-10-CM | POA: Diagnosis not present

## 2021-07-27 DIAGNOSIS — S42309A Unspecified fracture of shaft of humerus, unspecified arm, initial encounter for closed fracture: Secondary | ICD-10-CM

## 2021-07-27 DIAGNOSIS — M19011 Primary osteoarthritis, right shoulder: Secondary | ICD-10-CM | POA: Diagnosis not present

## 2021-07-27 DIAGNOSIS — Z8261 Family history of arthritis: Secondary | ICD-10-CM

## 2021-07-27 DIAGNOSIS — Z7401 Bed confinement status: Secondary | ICD-10-CM | POA: Diagnosis not present

## 2021-07-27 DIAGNOSIS — I499 Cardiac arrhythmia, unspecified: Secondary | ICD-10-CM | POA: Diagnosis not present

## 2021-07-27 DIAGNOSIS — M810 Age-related osteoporosis without current pathological fracture: Secondary | ICD-10-CM | POA: Diagnosis present

## 2021-07-27 DIAGNOSIS — Z4789 Encounter for other orthopedic aftercare: Secondary | ICD-10-CM | POA: Diagnosis not present

## 2021-07-27 DIAGNOSIS — I10 Essential (primary) hypertension: Secondary | ICD-10-CM | POA: Diagnosis not present

## 2021-07-27 DIAGNOSIS — F0280 Dementia in other diseases classified elsewhere without behavioral disturbance: Secondary | ICD-10-CM | POA: Diagnosis present

## 2021-07-27 DIAGNOSIS — J309 Allergic rhinitis, unspecified: Secondary | ICD-10-CM | POA: Diagnosis present

## 2021-07-27 DIAGNOSIS — G309 Alzheimer's disease, unspecified: Secondary | ICD-10-CM | POA: Diagnosis present

## 2021-07-27 DIAGNOSIS — Z79899 Other long term (current) drug therapy: Secondary | ICD-10-CM

## 2021-07-27 DIAGNOSIS — W1830XA Fall on same level, unspecified, initial encounter: Secondary | ICD-10-CM | POA: Diagnosis present

## 2021-07-27 DIAGNOSIS — Y92003 Bedroom of unspecified non-institutional (private) residence as the place of occurrence of the external cause: Secondary | ICD-10-CM | POA: Diagnosis not present

## 2021-07-27 DIAGNOSIS — F02C0 Dementia in other diseases classified elsewhere, severe, without behavioral disturbance, psychotic disturbance, mood disturbance, and anxiety: Secondary | ICD-10-CM | POA: Diagnosis present

## 2021-07-27 DIAGNOSIS — R9431 Abnormal electrocardiogram [ECG] [EKG]: Secondary | ICD-10-CM | POA: Diagnosis not present

## 2021-07-27 DIAGNOSIS — K921 Melena: Secondary | ICD-10-CM | POA: Diagnosis not present

## 2021-07-27 DIAGNOSIS — R0689 Other abnormalities of breathing: Secondary | ICD-10-CM | POA: Diagnosis not present

## 2021-07-27 DIAGNOSIS — T796XXD Traumatic ischemia of muscle, subsequent encounter: Secondary | ICD-10-CM | POA: Diagnosis not present

## 2021-07-27 DIAGNOSIS — S40022A Contusion of left upper arm, initial encounter: Secondary | ICD-10-CM | POA: Diagnosis not present

## 2021-07-27 DIAGNOSIS — R6889 Other general symptoms and signs: Secondary | ICD-10-CM | POA: Diagnosis not present

## 2021-07-27 DIAGNOSIS — R4182 Altered mental status, unspecified: Secondary | ICD-10-CM | POA: Diagnosis not present

## 2021-07-27 DIAGNOSIS — T148XXA Other injury of unspecified body region, initial encounter: Secondary | ICD-10-CM

## 2021-07-27 DIAGNOSIS — D509 Iron deficiency anemia, unspecified: Secondary | ICD-10-CM | POA: Diagnosis not present

## 2021-07-27 DIAGNOSIS — G43909 Migraine, unspecified, not intractable, without status migrainosus: Secondary | ICD-10-CM | POA: Diagnosis present

## 2021-07-27 DIAGNOSIS — K219 Gastro-esophageal reflux disease without esophagitis: Secondary | ICD-10-CM | POA: Diagnosis present

## 2021-07-27 DIAGNOSIS — M1612 Unilateral primary osteoarthritis, left hip: Secondary | ICD-10-CM | POA: Diagnosis not present

## 2021-07-27 DIAGNOSIS — S42202P Unspecified fracture of upper end of left humerus, subsequent encounter for fracture with malunion: Secondary | ICD-10-CM | POA: Diagnosis not present

## 2021-07-27 DIAGNOSIS — S42392A Other fracture of shaft of left humerus, initial encounter for closed fracture: Secondary | ICD-10-CM | POA: Diagnosis not present

## 2021-07-27 DIAGNOSIS — Z96641 Presence of right artificial hip joint: Secondary | ICD-10-CM | POA: Diagnosis present

## 2021-07-27 DIAGNOSIS — N2889 Other specified disorders of kidney and ureter: Secondary | ICD-10-CM | POA: Diagnosis not present

## 2021-07-27 DIAGNOSIS — S5012XA Contusion of left forearm, initial encounter: Secondary | ICD-10-CM

## 2021-07-27 DIAGNOSIS — Z743 Need for continuous supervision: Secondary | ICD-10-CM | POA: Diagnosis not present

## 2021-07-27 DIAGNOSIS — S42202D Unspecified fracture of upper end of left humerus, subsequent encounter for fracture with routine healing: Secondary | ICD-10-CM | POA: Diagnosis not present

## 2021-07-27 DIAGNOSIS — Z419 Encounter for procedure for purposes other than remedying health state, unspecified: Secondary | ICD-10-CM

## 2021-07-27 HISTORY — PX: EXTERNAL FIXATION ARM: SHX1552

## 2021-07-27 HISTORY — PX: APPLICATION OF WOUND VAC: SHX5189

## 2021-07-27 HISTORY — PX: FASCIOTOMY: SHX132

## 2021-07-27 LAB — CBC WITH DIFFERENTIAL/PLATELET
Abs Immature Granulocytes: 0.12 10*3/uL — ABNORMAL HIGH (ref 0.00–0.07)
Basophils Absolute: 0 10*3/uL (ref 0.0–0.1)
Basophils Relative: 0 %
Eosinophils Absolute: 0 10*3/uL (ref 0.0–0.5)
Eosinophils Relative: 0 %
HCT: 24.4 % — ABNORMAL LOW (ref 36.0–46.0)
Hemoglobin: 7.6 g/dL — ABNORMAL LOW (ref 12.0–15.0)
Immature Granulocytes: 1 %
Lymphocytes Relative: 5 %
Lymphs Abs: 1 10*3/uL (ref 0.7–4.0)
MCH: 31.4 pg (ref 26.0–34.0)
MCHC: 31.1 g/dL (ref 30.0–36.0)
MCV: 100.8 fL — ABNORMAL HIGH (ref 80.0–100.0)
Monocytes Absolute: 1.3 10*3/uL — ABNORMAL HIGH (ref 0.1–1.0)
Monocytes Relative: 7 %
Neutro Abs: 18 10*3/uL — ABNORMAL HIGH (ref 1.7–7.7)
Neutrophils Relative %: 87 %
Platelets: 523 10*3/uL — ABNORMAL HIGH (ref 150–400)
RBC: 2.42 MIL/uL — ABNORMAL LOW (ref 3.87–5.11)
RDW: 18.3 % — ABNORMAL HIGH (ref 11.5–15.5)
WBC: 20.5 10*3/uL — ABNORMAL HIGH (ref 4.0–10.5)
nRBC: 0.8 % — ABNORMAL HIGH (ref 0.0–0.2)

## 2021-07-27 LAB — CK: Total CK: 23294 U/L — ABNORMAL HIGH (ref 38–234)

## 2021-07-27 LAB — BASIC METABOLIC PANEL
Anion gap: 8 (ref 5–15)
BUN: 30 mg/dL — ABNORMAL HIGH (ref 8–23)
CO2: 20 mmol/L — ABNORMAL LOW (ref 22–32)
Calcium: 8.2 mg/dL — ABNORMAL LOW (ref 8.9–10.3)
Chloride: 106 mmol/L (ref 98–111)
Creatinine, Ser: 1.1 mg/dL — ABNORMAL HIGH (ref 0.44–1.00)
GFR, Estimated: 51 mL/min — ABNORMAL LOW (ref >60)
Glucose, Bld: 155 mg/dL — ABNORMAL HIGH (ref 70–99)
Potassium: 4.6 mmol/L (ref 3.5–5.1)
Sodium: 134 mmol/L — ABNORMAL LOW (ref 135–145)

## 2021-07-27 LAB — RESP PANEL BY RT-PCR (FLU A&B, COVID) ARPGX2
Influenza A by PCR: NEGATIVE
Influenza B by PCR: NEGATIVE
SARS Coronavirus 2 by RT PCR: NEGATIVE

## 2021-07-27 LAB — ABO/RH: ABO/RH(D): O POS

## 2021-07-27 LAB — BRAIN NATRIURETIC PEPTIDE: B Natriuretic Peptide: 951.9 pg/mL — ABNORMAL HIGH (ref 0.0–100.0)

## 2021-07-27 SURGERY — APPLICATION, WOUND VAC
Anesthesia: General | Site: Arm Upper | Laterality: Left

## 2021-07-27 MED ORDER — ACETAMINOPHEN 10 MG/ML IV SOLN: 1000.0000 mg | Freq: Once | INTRAVENOUS | Status: DC | PRN

## 2021-07-27 MED ORDER — SODIUM CHLORIDE 0.9 % IV BOLUS
1000.0000 mL | Freq: Once | INTRAVENOUS | Status: AC
  Administered 2021-07-27: 1000 mL via INTRAVENOUS

## 2021-07-27 MED ORDER — ONDANSETRON HCL 4 MG/2ML IJ SOLN
INTRAMUSCULAR | Status: DC | PRN
  Administered 2021-07-27: 4 mg via INTRAVENOUS

## 2021-07-27 MED ORDER — CEFAZOLIN SODIUM-DEXTROSE 2-4 GM/100ML-% IV SOLN
INTRAVENOUS | Status: AC
  Filled 2021-07-27: qty 100

## 2021-07-27 MED ORDER — LIDOCAINE HCL (CARDIAC) PF 100 MG/5ML IV SOSY
PREFILLED_SYRINGE | INTRAVENOUS | Status: DC | PRN
  Administered 2021-07-27: 60 mg via INTRAVENOUS

## 2021-07-27 MED ORDER — DEXAMETHASONE SODIUM PHOSPHATE 10 MG/ML IJ SOLN
INTRAMUSCULAR | Status: DC | PRN
  Administered 2021-07-27: 10 mg via INTRAVENOUS

## 2021-07-27 MED ORDER — LIDOCAINE HCL (PF) 1 % IJ SOLN
INTRAMUSCULAR | Status: AC
  Filled 2021-07-27: qty 30

## 2021-07-27 MED ORDER — PHENYLEPHRINE HCL-NACL 20-0.9 MG/250ML-% IV SOLN
INTRAVENOUS | Status: DC | PRN
Start: 2021-07-27 — End: 2021-07-27
  Administered 2021-07-27: 100 ug/min via INTRAVENOUS

## 2021-07-27 MED ORDER — SODIUM CHLORIDE 0.9 % IV BOLUS
500.0000 mL | Freq: Once | INTRAVENOUS | Status: AC
  Administered 2021-07-27: 500 mL via INTRAVENOUS

## 2021-07-27 MED ORDER — SODIUM CHLORIDE 0.9 % IV BOLUS: 500.0000 mL | Freq: Once | INTRAVENOUS | Status: DC

## 2021-07-27 MED ORDER — CEFAZOLIN SODIUM-DEXTROSE 2-4 GM/100ML-% IV SOLN: 2.0000 g | INTRAVENOUS | Status: DC

## 2021-07-27 MED ORDER — VANCOMYCIN HCL 1000 MG IV SOLR
INTRAVENOUS | Status: AC
  Filled 2021-07-27: qty 20

## 2021-07-27 MED ORDER — FENTANYL CITRATE (PF) 250 MCG/5ML IJ SOLN
INTRAMUSCULAR | Status: DC | PRN
  Administered 2021-07-27: 100 ug via INTRAVENOUS

## 2021-07-27 MED ORDER — ROCURONIUM 10MG/ML (10ML) SYRINGE FOR MEDFUSION PUMP - OPTIME
INTRAVENOUS | Status: DC | PRN
Start: 2021-07-27 — End: 2021-07-27
  Administered 2021-07-27: 50 mg via INTRAVENOUS

## 2021-07-27 MED ORDER — SODIUM CHLORIDE 0.9 % IV SOLN: INTRAVENOUS | Status: DC

## 2021-07-27 MED ORDER — MORPHINE SULFATE (PF) 2 MG/ML IV SOLN: 1.0000 mg | INTRAVENOUS | Status: DC | PRN

## 2021-07-27 MED ORDER — PROPOFOL 10 MG/ML IV BOLUS
INTRAVENOUS | Status: AC
  Filled 2021-07-27: qty 20

## 2021-07-27 MED ORDER — VANCOMYCIN HCL 1000 MG IV SOLR
INTRAVENOUS | Status: DC | PRN
  Administered 2021-07-27: 1000 mg

## 2021-07-27 MED ORDER — EPHEDRINE SULFATE (PRESSORS) 50 MG/ML IJ SOLN
INTRAMUSCULAR | Status: DC | PRN
  Administered 2021-07-27: 15 mg via INTRAVENOUS

## 2021-07-27 MED ORDER — FENTANYL CITRATE (PF) 250 MCG/5ML IJ SOLN
INTRAMUSCULAR | Status: AC
  Filled 2021-07-27: qty 5

## 2021-07-27 MED ORDER — PHENYLEPHRINE HCL (PRESSORS) 10 MG/ML IV SOLN
INTRAVENOUS | Status: AC
  Filled 2021-07-27: qty 1

## 2021-07-27 MED ORDER — ONDANSETRON HCL 4 MG/2ML IJ SOLN
4.0000 mg | Freq: Four times a day (QID) | INTRAMUSCULAR | Status: DC | PRN
  Administered 2021-07-30: 16:00:00 4 mg via INTRAVENOUS
  Filled 2021-07-27: qty 2

## 2021-07-27 MED ORDER — SUGAMMADEX SODIUM 200 MG/2ML IV SOLN
INTRAVENOUS | Status: DC | PRN
  Administered 2021-07-27: 400 mg via INTRAVENOUS

## 2021-07-27 MED ORDER — LACTATED RINGERS IV SOLN
INTRAVENOUS | Status: DC | PRN
Start: 2021-07-27 — End: 2021-07-27

## 2021-07-27 MED ORDER — LIDOCAINE HCL 1 % IJ SOLN
10.0000 mL | Freq: Once | INTRAMUSCULAR | Status: DC
  Filled 2021-07-27: qty 10

## 2021-07-27 MED ORDER — ACETAMINOPHEN 500 MG PO TABS: 1000.0000 mg | ORAL_TABLET | Freq: Once | ORAL | Status: DC

## 2021-07-27 MED ORDER — ALBUMIN HUMAN 5 % IV SOLN
INTRAVENOUS | Status: DC | PRN
Start: 2021-07-27 — End: 2021-07-27

## 2021-07-27 MED ORDER — SUCCINYLCHOLINE 20MG/ML (10ML) SYRINGE FOR MEDFUSION PUMP - OPTIME
INTRAMUSCULAR | Status: DC | PRN
  Administered 2021-07-27: 100 mg via INTRAVENOUS

## 2021-07-27 MED ORDER — 0.9 % SODIUM CHLORIDE (POUR BTL) OPTIME
TOPICAL | Status: DC | PRN
  Administered 2021-07-27: 1000 mL

## 2021-07-27 MED ORDER — FENTANYL CITRATE (PF) 100 MCG/2ML IJ SOLN: 25.0000 ug | INTRAMUSCULAR | Status: DC | PRN

## 2021-07-27 MED ORDER — PROPOFOL 10 MG/ML IV BOLUS
INTRAVENOUS | Status: DC | PRN
Start: 2021-07-27 — End: 2021-07-27
  Administered 2021-07-27: 120 mg via INTRAVENOUS

## 2021-07-27 MED ORDER — PHENYLEPHRINE HCL (PRESSORS) 10 MG/ML IV SOLN
INTRAVENOUS | Status: DC | PRN
  Administered 2021-07-27 (×3): 120 ug via INTRAVENOUS

## 2021-07-27 MED ORDER — ONDANSETRON HCL 4 MG PO TABS: 4.0000 mg | ORAL_TABLET | Freq: Four times a day (QID) | ORAL | Status: DC | PRN

## 2021-07-27 SURGICAL SUPPLY — 96 items
APL PRP STRL LF DISP 70% ISPRP (MISCELLANEOUS) ×3
BAG COUNTER SPONGE SURGICOUNT (BAG) ×5 IMPLANT
BAG SPNG CNTER NS LX DISP (BAG) ×6
BANDAGE ESMARK 6X9 LF (GAUZE/BANDAGES/DRESSINGS) ×3 IMPLANT
BLADE SURG 10 STRL SS (BLADE) ×4 IMPLANT
BLADE SURG 15 STRL LF DISP TIS (BLADE) ×3 IMPLANT
BLADE SURG 15 STRL SS (BLADE) ×4
BNDG CMPR 9X6 STRL LF SNTH (GAUZE/BANDAGES/DRESSINGS)
BNDG COHESIVE 4X5 TAN STRL (GAUZE/BANDAGES/DRESSINGS) ×4 IMPLANT
BNDG ELASTIC 4X5.8 VLCR STR LF (GAUZE/BANDAGES/DRESSINGS) IMPLANT
BNDG ELASTIC 6X5.8 VLCR STR LF (GAUZE/BANDAGES/DRESSINGS) ×1 IMPLANT
BNDG ESMARK 6X9 LF (GAUZE/BANDAGES/DRESSINGS)
BNDG GAUZE ELAST 4 BULKY (GAUZE/BANDAGES/DRESSINGS) ×4 IMPLANT
CANISTER WOUNDNEG PRESSURE 500 (CANNISTER) ×1 IMPLANT
CHLORAPREP W/TINT 26 (MISCELLANEOUS) ×4 IMPLANT
CONNECTOR Y WND VAC (MISCELLANEOUS) IMPLANT
COVER MAYO STAND STRL (DRAPES) ×3 IMPLANT
COVER SURGICAL LIGHT HANDLE (MISCELLANEOUS) ×4 IMPLANT
CUFF TOURN SGL QUICK 34 (TOURNIQUET CUFF)
CUFF TRNQT CYL 34X4.125X (TOURNIQUET CUFF) IMPLANT
DOPPLER CAUTERY SUPPRESSOR (INSTRUMENTS) ×1 IMPLANT
DRAPE C-ARM 42X72 X-RAY (DRAPES) ×4 IMPLANT
DRAPE C-ARMOR (DRAPES) ×1 IMPLANT
DRAPE IMP U-DRAPE 54X76 (DRAPES) ×8 IMPLANT
DRAPE INCISE IOBAN 66X45 STRL (DRAPES) ×5 IMPLANT
DRSG PAD ABDOMINAL 8X10 ST (GAUZE/BANDAGES/DRESSINGS) ×16 IMPLANT
DRSG VAC ATS LRG SENSATRAC (GAUZE/BANDAGES/DRESSINGS) ×1 IMPLANT
ELECT REM PT RETURN 9FT ADLT (ELECTROSURGICAL) ×4
ELECTRODE REM PT RTRN 9FT ADLT (ELECTROSURGICAL) ×3 IMPLANT
GAUZE SPONGE 4X4 12PLY STRL (GAUZE/BANDAGES/DRESSINGS) ×4 IMPLANT
GAUZE SPONGE 4X4 12PLY STRL LF (GAUZE/BANDAGES/DRESSINGS) ×1 IMPLANT
GAUZE XEROFORM 5X9 LF (GAUZE/BANDAGES/DRESSINGS) ×1 IMPLANT
GEL ULTRASOUND 8.5O AQUASONIC (MISCELLANEOUS) ×1 IMPLANT
GLOVE BIOGEL PI IND STRL 7.5 (GLOVE) IMPLANT
GLOVE BIOGEL PI INDICATOR 7.5 (GLOVE) ×2
GLOVE EUDERMIC 6.5 POWDERFREE (GLOVE) ×1 IMPLANT
GLOVE OPTIFIT SS 6.0 STRL BRWN (GLOVE) ×1 IMPLANT
GLOVE OPTIFIT SS 7.5 STRL LX (GLOVE) ×2 IMPLANT
GLOVE SRG 8 PF TXTR STRL LF DI (GLOVE) ×3 IMPLANT
GLOVE SURG ENC MOIS LTX SZ7.5 (GLOVE) ×4 IMPLANT
GLOVE SURG ENC MOIS LTX SZ8 (GLOVE) ×12 IMPLANT
GLOVE SURG SYN 7.5  E (GLOVE) ×4
GLOVE SURG SYN 7.5 E (GLOVE) ×3 IMPLANT
GLOVE SURG SYN 7.5 PF PI (GLOVE) ×3 IMPLANT
GLOVE SURG UNDER POLY LF SZ7.5 (GLOVE) ×4 IMPLANT
GLOVE SURG UNDER POLY LF SZ8 (GLOVE) ×4
GOWN STRL REUS W/ TWL LRG LVL3 (GOWN DISPOSABLE) ×6 IMPLANT
GOWN STRL REUS W/ TWL XL LVL3 (GOWN DISPOSABLE) ×6 IMPLANT
GOWN STRL REUS W/TWL LRG LVL3 (GOWN DISPOSABLE) ×8
GOWN STRL REUS W/TWL XL LVL3 (GOWN DISPOSABLE) ×8
IMMOBILIZER KNEE 22 UNIV (SOFTGOODS) IMPLANT
KIT BASIN OR (CUSTOM PROCEDURE TRAY) ×4 IMPLANT
KIT TURNOVER KIT B (KITS) ×4 IMPLANT
MANIFOLD NEPTUNE II (INSTRUMENTS) ×4 IMPLANT
NDL SUT 6 .5 CRC .975X.05 MAYO (NEEDLE) IMPLANT
NEEDLE MAYO TAPER (NEEDLE)
NS IRRIG 1000ML POUR BTL (IV SOLUTION) ×4 IMPLANT
PACK ORTHO EXTREMITY (CUSTOM PROCEDURE TRAY) ×4 IMPLANT
PAD ARMBOARD 7.5X6 YLW CONV (MISCELLANEOUS) ×8 IMPLANT
PAD NEG PRESSURE SENSATRAC (MISCELLANEOUS) ×1 IMPLANT
PADDING CAST SYN 6 (CAST SUPPLIES) ×1
PADDING CAST SYNTHETIC 4 (CAST SUPPLIES) ×1
PADDING CAST SYNTHETIC 4X4 STR (CAST SUPPLIES) IMPLANT
PADDING CAST SYNTHETIC 6X4 NS (CAST SUPPLIES) IMPLANT
PIN APEX 120 EXFIX (EXFIX) ×2 IMPLANT
PIN CLAMP 5H DIA 4X5X6M (EXFIX) ×2 IMPLANT
PIN HALF APEX 4X150MM (PIN) ×2 IMPLANT
POST 30DEG ANGLED DIA 11M (EXFIX) ×2 IMPLANT
POST STRAIGHT DIA 11M (EXFIX) ×2 IMPLANT
ROD HOFFMANN3 CONNECT 11X200 (EXFIX) ×2 IMPLANT
ROD TO ROD COUPLING EXFIX (EXFIX) ×4 IMPLANT
SET MONITOR QUICK PRESSURE (MISCELLANEOUS) ×1 IMPLANT
SPONGE T-LAP 18X18 ~~LOC~~+RFID (SPONGE) ×4 IMPLANT
STOCKINETTE IMPERVIOUS LG (DRAPES) ×4 IMPLANT
SUCTION FRAZIER HANDLE 10FR (MISCELLANEOUS) ×4
SUCTION TUBE FRAZIER 10FR DISP (MISCELLANEOUS) ×3 IMPLANT
SUT ETHILON 3 0 PS 1 (SUTURE) ×6 IMPLANT
SUT FIBERWIRE #2 38 T-5 BLUE (SUTURE)
SUT FIBERWIRE 2-0 18 17.9 3/8 (SUTURE)
SUT MNCRL AB 4-0 PS2 18 (SUTURE) IMPLANT
SUT MON AB 2-0 CT1 36 (SUTURE) ×3 IMPLANT
SUT SILK 2 0 TIES 10X30 (SUTURE) ×1 IMPLANT
SUT VIC AB 0 CT1 27 (SUTURE)
SUT VIC AB 0 CT1 27XBRD ANBCTR (SUTURE) ×6 IMPLANT
SUTURE FIBERWR #2 38 T-5 BLUE (SUTURE) IMPLANT
SUTURE FIBERWR 2-0 18 17.9 3/8 (SUTURE) ×3 IMPLANT
TOWEL GREEN STERILE (TOWEL DISPOSABLE) ×8 IMPLANT
TOWEL GREEN STERILE FF (TOWEL DISPOSABLE) ×4 IMPLANT
TOWEL OR NON WOVEN STRL DISP B (DISPOSABLE) ×4 IMPLANT
TRAY FOLEY W/BAG SLVR 14FR (SET/KITS/TRAYS/PACK) IMPLANT
TRAY FOLEY W/BAG SLVR 16FR (SET/KITS/TRAYS/PACK) ×4
TRAY FOLEY W/BAG SLVR 16FR ST (SET/KITS/TRAYS/PACK) IMPLANT
TUBE CONNECTING 12X1/4 (SUCTIONS) ×5 IMPLANT
WATER STERILE IRR 1000ML POUR (IV SOLUTION) ×8 IMPLANT
WND VAC CONN Y (MISCELLANEOUS) ×1
YANKAUER SUCT BULB TIP NO VENT (SUCTIONS) ×5 IMPLANT
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
## 2021-07-27 NOTE — Assessment & Plan Note (Addendum)
Now status post intramedullary nailing of the left humerus on 1/31.  Needs ongoing rehab.

## 2021-07-27 NOTE — Transfer of Care (Signed)
Immediate Anesthesia Transfer of Care Note  Patient: [name]  Procedure(s) Performed: FASCIOTOMY LEFT FOREARM (Left: Arm Lower) EXTERNAL FIXATION LEFT HUMERUS (Left: Arm Upper) APPLICATION OF WOUND VAC (Left)  Patient Location: PACU  Anesthesia Type:General  Level of Consciousness: awake and sedated  Airway & Oxygen Therapy: Patient connected to nasal cannula oxygen  Post-op Assessment: Report given to RN and Post -op Vital signs reviewed and stable  Post vital signs: Reviewed and stable  Last Vitals:  Vitals Value Taken Time  BP 112/58 07/27/21 2242  Temp 98.4   Pulse 25 07/27/21 2245  Resp 21 07/27/21 2245  SpO2 73 % 07/27/21 2245  Vitals shown include unvalidated device data.  Last Pain:  Vitals:   07/27/21 1630  TempSrc: Oral  PainSc: 0-No pain         Complications: No notable events documented.

## 2021-07-27 NOTE — Progress Notes (Signed)
Orthopedic Tech Progress Note Patient Details:  [name] [birth year] [id number]  Dr. Eulis Foster spoke with me beforehand to show me a wound on her arm in the left antecubital region. He asked for it to be dressed with an oil-emulsion type gauze, 4x4 gauze, and Kerlix before applying the splint. We also agreed a coaptation splint was better suited for the pt than a posterior long arm splint.  Well-padded coaptation splint applied in best obtainable fashion given pt body habitus and placed in sling afterwards.  Ortho Devices Type of Ortho Device: Sling immobilizer, Coapt, Cotton web roll Ortho Device/Splint Location: LUE Ortho Device/Splint Interventions: Ordered, Application, Adjustment   Post Interventions Patient Tolerated: Well Instructions Provided: Care of device, Adjustment of device  [name] Jeri Modena 07/27/2021, 2:46 PM

## 2021-07-27 NOTE — Progress Notes (Signed)
Pacu RN Report to floor given  Gave report to  [employer]. Room: 5N25Discussed surgery, meds given in OR and Pacu, VS, IV fluids given, EBL, urine output, pain and other pertinent information. Also discussed if pt had any family or friends here or belongings with them.   Discussed wound vac, external fixator and pt has Rhabdo. Son has gone home, will be back tomorrow.   Pt exits my care.

## 2021-07-27 NOTE — Op Note (Signed)
07/27/2021  7:39 PM  PATIENT:  [name]    PRE-OPERATIVE DIAGNOSIS: Left humerus fracture with left forearm compartment syndrome  POST-OPERATIVE DIAGNOSIS:  Same  PROCEDURE:  FASCIOTOMY LEFT FOREARM, EXTERNAL FIXATION LEFT HUMERUS, application of wound VAC  SURGEON:  Radiance Deady A Crissie Aloi, MD  PHYSICIAN ASSISTANT: Merlene Pulling, PA-C, present and scrubbed throughout the case, critical for completion in a timely fashion, and for retraction, instrumentation, and closure.  ANESTHESIA:   General  PREOPERATIVE INDICATIONS:  [name] is a  81 y.o. female who presented to the emergency room today after a fall with her left arm stuck in a drawer for a prolonged period of time.  Was found to have a midshaft humerus fracture.  Given the prolonged period of the arm being stuck the forearm was notably swollen and given the patient's underlying dementia compartments of the forearm were checked with the Stryker device and found to be 65 mmHg.  Given the markedly elevated compartment pressures emergent forearm fasciotomies was indicated.  To stabilize the fracture and allow for arm elevation wound care and compartment checks external fixation of the humerus fracture was also recommended.  The risks benefits and alternatives were discussed with the patient preoperatively including but not limited to the risks of infection, bleeding, nerve injury, cardiopulmonary complications, the need for revision surgery, among others, and the patient and her son were willing to proceed.  ESTIMATED BLOOD LOSS: 200cc  OPERATIVE IMPLANTS: Stryker external fixator, 4.40mm pins x4  OPERATIVE FINDINGS: Muscle in all forearm compartments was pink, perfused and bleeding, but not contractile.  Skin incisions were partially closed.  Dopplerable radial pulse confirmed at the end of the case.  Closed reduction application of upper arm external fixator  OPERATIVE PROCEDURE:  The patient was brought to the emergency room and  placed supine on the operating room table.  General anesthesia was induced.  The arm was prepped and draped in a sterile fashion.  2 g of Ancef were given.  A timeout was called.  We first turned our attention to the volar aspect of the forearm.  A curvilinear incision was made starting radial at the upper arm to allow for access of the mobile wad and then curving ulnarly distally. Fascia of the volar forearm and mobile were carefully released.  The underlying muscle was found to be significantly swollen swollen, pinkish in color, bleeding, but unfortunately not contractile when assessed with cautery.  We next turned our attention of the dorsal compartment a longitudinal incision was made over the mid dorsal forearm.  Fascia of the dorsal forearm was carefully released.  The underlying muscle was found to be significantly swollen swollen, pinkish in color, bleeding, but unfortunately not contractile when assessed with cautery.  We next turned our attention to application of external fixator.  Using 4.0 millimeter pins.  2 pins were placed percutaneously proximally into the anterior lateral aspect of the humerus.  Next we turned our attention distally.  A small 3 cm incision was made over the lateral aspect of the humerus.  Care was taken to spread bluntly to the lateral humerus to avoid injury to the radial nerve.  Through the soft tissue sleeve 2 pins were drilled laterally to medially.  Fluoroscopy confirmed that all pins were bicortical.  The Ex-Fix bars were then assembled and under fluoroscopy the humerus fracture was reduced and the Ex-Fix was tightened.  AP and lateral x-rays confirmed acceptable reduction of the humerus fracture.  The wounds were then thoroughly irrigated.  1  g of vancomycin powder was placed in the wounds. The wounds were partially closed with interrupted 3-0 nylon.  Due to tightness of the forearm and approximately 7 x 4 cm area was left open on the dorsal forearm and a 3 x 5 cm  area on the distal volar forearm was left open.  The remainder of the skin was able to be closed without significant tension.  A wound VAC was applied over the open areas.  Sterile dressing was applied to the pin sites and the remainder of the arm.  The patient was awoken from anesthesia and transferred to the recovery room in stable condition.   Post op recs: WB: Nonweightbearing left upper extremity Abx: ancef x23 hours post op Imaging: PACU xrays Dressing: Wound VAC forearm 125 mmHg continuous DVT prophylaxis: lovenox starting POD1 x4 weeks Disposition: Plan to return to the OR later this week for repeat debridement of the forearm with repeat application of wound VAC or wound closure. Address: [address], [place], [place] [address]  Office Phone: [phone]  Charlies Constable, MD Orthopaedic Surgery

## 2021-07-27 NOTE — Assessment & Plan Note (Addendum)
Resume carvedilol at a lower dose.  Hold other antihypertensives

## 2021-07-27 NOTE — TOC Initial Note (Addendum)
Transition of Care [facility]) - Initial/Assessment Note    Patient Details  Name: [name] MRN: [id number] Date of Birth: 19-Dec-1940  Transition of Care [facility]) CM/SW Contact:    Verdell Carmine, RN Phone Number: 07/27/2021, 12:27 PM  Clinical Narrative:                  Patient from [place].  She was found hanging by his arm from the dresser accidental . , unsure how long the patient was there. Had recent work up for memory care at that time they recommended assisted living, son felt she was alright in [place]. Radiographs pending but had obvious deformity. Consult TOC if needed further   Barriers to Discharge: Continued Medical Work up   Patient Goals and CMS Choice        Expected Discharge Plan and Services                                                Prior Living Arrangements/Services                       Activities of Daily Living      Permission Sought/Granted                  Emotional Assessment              Admission diagnosis:  Arm fracture Patient Active Problem List   Diagnosis Date Noted   Alzheimer disease ([place]) 07/09/2021   Vascular dementia ([place]) 07/09/2021   Fall    Elevated CK 04/28/2021   Traumatic rhabdomyolysis ([place])    Community acquired pneumonia 04/27/2021   GERD (gastroesophageal reflux disease) 08/19/2020   Hypokalemia 08/19/2020   Urinary frequency 08/19/2020   Weakness 08/19/2020   Dyspnea 08/19/2020   Troponin I above reference range 08/19/2020   Skin cancer 06/09/2017   Essential hypertension 08/26/2015   PCP:  Orpah Melter, MD Pharmacy:   [id number] [place] [place] [address] Phone: [phone] Fax: [phone]  [id number] - [address] [place] [address] Phone: [phone] Fax: [phone]     Social Determinants of Health (SDOH)  Interventions    Readmission Risk Interventions No flowsheet data found.

## 2021-07-27 NOTE — Assessment & Plan Note (Addendum)
Continue to monitor in the outpatient setting.

## 2021-07-27 NOTE — Assessment & Plan Note (Addendum)
At baseline, delirium precautions.

## 2021-07-27 NOTE — Assessment & Plan Note (Addendum)
CK on arrival 23,294.  Patient was aggressively hydrated.  CK started trending down.  Electrolytes have been stable.

## 2021-07-27 NOTE — ED Provider Notes (Signed)
[facility] EMERGENCY DEPARTMENT Provider Note   CSN: [id number] Arrival date & time: 07/27/21  1118     History  Chief Complaint  Patient presents with   Arm Injury    Clarise Chacko is a 81 y.o. female.  HPI Patient with severe dementia and memory loss, lives independently with caregiver assistance, was found this morning entangled in a dresser, apparently with humerus fracture.  It is not clear if she was standing when found her on the floor.  During transport EMS treated with fentanyl and reportedly straightened out her humerus before transport.    Home Medications Prior to Admission medications   Medication Sig Start Date End Date Taking? Authorizing Provider  amLODipine (NORVASC) 5 MG tablet Take 1 tablet (5 mg total) by mouth daily. 08/28/20  Yes Caren Griffins, MD  aspirin 325 MG tablet Take 650 mg by mouth every 6 (six) hours as needed for mild pain.   Yes [provider]  carvedilol (COREG) 12.5 MG tablet TAKE 1 TABLET BY MOUTH TWICE A DAY WITH FOOD Patient taking differently: Take 12.5 mg by mouth 2 (two) times daily with a meal. 10/17/18  Yes McGowen, Adrian Blackwater, MD  levocetirizine (XYZAL) 5 MG tablet Take 5 mg by mouth daily as needed for allergies.   Yes [provider]  rosuvastatin (CRESTOR) 10 MG tablet TAKE 1 TABLET BY MOUTH EVERY DAY Patient taking differently: Take 10 mg by mouth daily. 07/11/21  Yes Donato Heinz, MD      Allergies    Molds & smuts, Other, and Statins    Review of Systems   Review of Systems  Physical Exam Updated Vital Signs BP 115/67    Pulse (!) 101    Temp (!) 97.5 F (36.4 C) (Oral)    Resp (!) 35    SpO2 95%  Physical Exam Vitals and nursing note reviewed.  Constitutional:      General: She is not in acute distress.    Appearance: She is well-developed. She is obese. She is not ill-appearing, toxic-appearing or diaphoretic.  HENT:     Head: Normocephalic and atraumatic.     Right  Ear: External ear normal.     Left Ear: External ear normal.  Eyes:     Conjunctiva/sclera: Conjunctivae normal.     Pupils: Pupils are equal, round, and reactive to light.  Neck:     Trachea: Phonation normal.  Cardiovascular:     Rate and Rhythm: Normal rate and regular rhythm.     Heart sounds: Normal heart sounds.  Pulmonary:     Effort: Pulmonary effort is normal. No respiratory distress.     Breath sounds: Normal breath sounds. No stridor.  Chest:     Chest wall: No tenderness.  Abdominal:     General: There is no distension.     Palpations: Abdomen is soft.     Tenderness: There is no abdominal tenderness. There is no guarding.  Musculoskeletal:     Cervical back: Normal range of motion and neck supple.     Comments: She is holding left arm with elbow straight.  Midshaft humerus disruption, freely mobile.  Proximal ulnar contusion with swelling and compression type injury.  Neurovascular intact distally in the left wrist and fingers of the left hand.  See representative photos.  Tenderness left hip and pelvis with passive range of motion of the left leg.  Right leg moves easily.  Skin:    General: Skin is warm and  dry.  Neurological:     Mental Status: She is alert.     Cranial Nerves: No cranial nerve deficit.     Sensory: No sensory deficit.     Motor: No abnormal muscle tone.     Coordination: Coordination normal.  Psychiatric:        Mood and Affect: Mood normal.        Behavior: Behavior normal.    ED Results / Procedures / Treatments   Labs (all labs ordered are listed, but only abnormal results are displayed) Labs Reviewed  BRAIN NATRIURETIC PEPTIDE - Abnormal; Notable for the following components:      Result Value   B Natriuretic Peptide 951.9 (*)    All other components within normal limits  CBC WITH DIFFERENTIAL/PLATELET - Abnormal; Notable for the following components:   WBC 20.5 (*)    RBC 2.42 (*)    Hemoglobin 7.6 (*)    HCT 24.4 (*)    MCV 100.8  (*)    RDW 18.3 (*)    Platelets 523 (*)    nRBC 0.8 (*)    Neutro Abs 18.0 (*)    Monocytes Absolute 1.3 (*)    Abs Immature Granulocytes 0.12 (*)    All other components within normal limits  CK - Abnormal; Notable for the following components:   Total CK 23,294 (*)    All other components within normal limits  BASIC METABOLIC PANEL - Abnormal; Notable for the following components:   Sodium 134 (*)    CO2 20 (*)    Glucose, Bld 155 (*)    BUN 30 (*)    Creatinine, Ser 1.10 (*)    Calcium 8.2 (*)    GFR, Estimated 51 (*)    All other components within normal limits  RESP PANEL BY RT-PCR (FLU A&B, COVID) ARPGX2  URINALYSIS, ROUTINE W REFLEX MICROSCOPIC  MYOGLOBIN, URINE    EKG EKG Interpretation  Date/Time:  Sunday July 27 2021 11:31:59 EST Ventricular Rate:  97 PR Interval:  160 QRS Duration: 140 QT Interval:  405 QTC Calculation: 515 R Axis:   -14 Text Interpretation: Sinus rhythm Left bundle branch block since last tracing no significant change Confirmed by Daleen Bo [phone]) on 07/27/2021 12:54:42 PM  Radiology DG Chest 1 View  Result Date: 07/27/2021 CLINICAL DATA:  Unwitnessed fall EXAM: CHEST  1 VIEW COMPARISON:  04/27/2021 FINDINGS: Heart size and vascularity normal. Lungs clear without infiltrate or effusion. Moderate to advanced degenerative change in both shoulders. IMPRESSION: No active disease. Electronically Signed   By: Franchot Gallo M.D.   On: 07/27/2021 12:56   DG Elbow Complete Left  Result Date: 07/27/2021 CLINICAL DATA:  Fall.  Deformity EXAM: LEFT ELBOW - COMPLETE 3+ VIEW COMPARISON:  None. FINDINGS: There is no evidence of fracture, dislocation, or joint effusion. There is no evidence of arthropathy or other focal bone abnormality. Soft tissues are unremarkable. IMPRESSION: Negative. Electronically Signed   By: Franchot Gallo M.D.   On: 07/27/2021 12:52   DG Forearm Left  Result Date: 07/27/2021 CLINICAL DATA:  Fall. EXAM: LEFT FOREARM - 2  VIEW COMPARISON:  None. FINDINGS: There is no evidence of fracture or other focal bone lesions. Soft tissues are unremarkable. IMPRESSION: Negative. Electronically Signed   By: Franchot Gallo M.D.   On: 07/27/2021 12:53   DG Humerus Left  Result Date: 07/27/2021 CLINICAL DATA:  Fall EXAM: LEFT HUMERUS - 2+ VIEW COMPARISON:  None. FINDINGS: Acute transverse fracture mid shaft of left  humerus with angulation and mild displacement. Advanced degenerative changes left shoulder joint. IMPRESSION: Acute fracture left mid humerus. Electronically Signed   By: Franchot Gallo M.D.   On: 07/27/2021 12:55   DG HIP UNILAT WITH PELVIS 2-3 VIEWS LEFT  Result Date: 07/27/2021 CLINICAL DATA:  Fall EXAM: DG HIP (WITH OR WITHOUT PELVIS) 2-3V LEFT COMPARISON:  04/27/2021 FINDINGS: Right hip replacement in satisfactory alignment. No fracture or complication. Advanced degenerative change left hip with joint space narrowing, spurring, sclerosis and collapse of the femoral head. Negative for acute fracture Calcification aorta and iliac arteries. Bilateral fallopian tube clips. IMPRESSION: Negative for acute fracture. Advanced degenerative change left hip. Electronically Signed   By: Franchot Gallo M.D.   On: 07/27/2021 12:54    Procedures Procedures    Medications Ordered in ED Medications  sodium chloride 0.9 % bolus 500 mL (has no administration in time range)  sodium chloride 0.9 % bolus 500 mL (0 mLs Intravenous Stopped 07/27/21 1523)    ED Course/ Medical Decision Making/ A&P                           Medical Decision Making Patient presenting from her independent living facility where she was found tangled up in a dresser drawer with her body being supported by her arm which was stuck in a drawer above her head.  She presents with a clinically evident midshaft humeral fracture.  She has a significant injury to the left volar forearm, likely secondary to the incident mentioned above.  She requires comprehensive  evaluation.  Problems Addressed: Abrasion of left upper extremity, initial encounter: complicated acute illness or injury    Details: Requires attention, to ensure no full-thickness injury.  This may evolve over the next several days time. Closed nondisplaced transverse fracture of shaft of left humerus, initial encounter: acute illness or injury    Details: Requires orthopedic surgical intervention Pain: acute illness or injury    Details: Required treatment with narcotic analgesia by EMS Traumatic rhabdomyolysis, initial encounter [facility]): complicated acute illness or injury that poses a threat to life or bodily functions    Details: Risk for renal failure.  Amount and/or Complexity of Data Reviewed Independent Historian: caregiver External Data Reviewed: ECG and notes.    Details: Neurology evaluation in office, 07/09/2021; noted altered mental status secondary to Alzheimer's and vascular dementia. Old EKG reviewed for correlation Labs: ordered.    Details: CBC, metabolic panel, CK to evaluate for rhabdomyolysis, BMP clary heart failure-significant abnormalities requiring ongoing management include elevated BNP, elevated white count, elevated CK, elevated creatinine, elevated glucose Radiology: ordered and independent interpretation performed.    Details: Multiple radiographs: Abnormal findings include midshaft left humerus fracture, marked degenerative change of the left hip ECG/medicine tests: ordered and independent interpretation performed.    Details: Normal sinus rhythm Discussion of management or test interpretation with external provider(s): Arrangements for surgical intervention made with orthopedic on-call, Dr. Mardelle Matte.  He will see the patient as a Optometrist and anticipates operative management.  He understands that her upper extremity wound on the left will need to be addressed as well.  Inpatient hospitalization consult obtained.  Risk Decision regarding hospitalization. Risk  Details: Patient requires hospitalization for marked elevation of the CK, which can lead to renal failure.  She will require close attention to the left forearm soft tissue injury as well as surgical management for the humerus fracture.  Hospitalist contacted to arrange for inpatient treatment.  Critical  Care Total time providing critical care: 30-74 minutes          Final Clinical Impression(s) / ED Diagnoses Final diagnoses:  Traumatic rhabdomyolysis, initial encounter ([place])  Closed nondisplaced transverse fracture of shaft of left humerus, initial encounter  Abrasion of left upper extremity, initial encounter    Rx / DC Orders ED Discharge Orders     None         Daleen Bo, MD 07/27/21 1534

## 2021-07-27 NOTE — Assessment & Plan Note (Deleted)
Likely reactive, no signs of infection but UA still pending Trend and follow fever curve

## 2021-07-27 NOTE — Anesthesia Preprocedure Evaluation (Addendum)
Anesthesia Evaluation  Patient identified by MRN, date of birth, ID band Patient confused    Reviewed: Allergy & Precautions, Patient's Chart, lab work & pertinent test results  Airway Mallampati: II  TM Distance: >3 FB Neck ROM: Full    Dental  (+) Poor Dentition   Pulmonary neg pulmonary ROS,    Pulmonary exam normal        Cardiovascular hypertension, Pt. on medications and Pt. on home beta blockers + Peripheral Vascular Disease   Rhythm:Regular Rate:Normal  Ascending aortic aneurysm   Neuro/Psych  Headaches, Anxiety Depression Dementia    GI/Hepatic Neg liver ROS, GERD  ,  Endo/Other  negative endocrine ROS  Renal/GU negative Renal ROS  negative genitourinary   Musculoskeletal  (+) Arthritis , Osteoarthritis,  Left forearm compartment syndrome   Abdominal Normal abdominal exam  (+)   Peds  Hematology  (+) anemia ,   Anesthesia Other Findings   Reproductive/Obstetrics                            Anesthesia Physical Anesthesia Plan  ASA: 3  Anesthesia Plan: General   Post-op Pain Management:    Induction: Intravenous  PONV Risk Score and Plan: 3 and Ondansetron, Dexamethasone and Treatment may vary due to age or medical condition  Airway Management Planned: Mask and Oral ETT  Additional Equipment: None  Intra-op Plan:   Post-operative Plan: Extubation in OR  Informed Consent: I have reviewed the patients History and Physical, chart, labs and discussed the procedure including the risks, benefits and alternatives for the proposed anesthesia with the patient or authorized representative who has indicated his/her understanding and acceptance.     Dental advisory given  Plan Discussed with: CRNA  Anesthesia Plan Comments: (Lab Results      Component                Value               Date                      WBC                      20.5 (H)            07/27/2021                 HGB                      7.6 (L)             07/27/2021                HCT                      24.4 (L)            07/27/2021                MCV                      100.8 (H)           07/27/2021                PLT                      523 (H)  07/27/2021           Lab Results      Component                Value               Date                      NA                       134 (L)             07/27/2021                K                        4.6                 07/27/2021                CO2                      20 (L)              07/27/2021                GLUCOSE                  155 (H)             07/27/2021                BUN                      30 (H)              07/27/2021                CREATININE               1.10 (H)            07/27/2021                CALCIUM                  8.2 (L)             07/27/2021                GFRNONAA                 51 (L)              07/27/2021          )       Anesthesia Quick Evaluation

## 2021-07-27 NOTE — Assessment & Plan Note (Addendum)
Baseline hgb around 10.7 two months ago.  Drop in hemoglobin was noted at the time of admission. Looks like she was transfused 2 units of PRBC on 1/31.  Previously required 3 units.   Total of 5 units have been transfused during this hospital stay. CBC will need to be monitored at the skilled nursing facility

## 2021-07-27 NOTE — Anesthesia Procedure Notes (Signed)
Procedure Name: Intubation Date/Time: 07/27/2021 8:21 PM Performed by: Valetta Fuller, CRNA Pre-anesthesia Checklist: Patient identified, Emergency Drugs available, Suction available and Patient being monitored Patient Re-evaluated:Patient Re-evaluated prior to induction Oxygen Delivery Method: Circle system utilized Preoxygenation: Pre-oxygenation with 100% oxygen Induction Type: IV induction Ventilation: Mask ventilation without difficulty Laryngoscope Size: Miller and 2 Grade View: Grade I Tube type: Oral Tube size: 7.5 mm Number of attempts: 1 Airway Equipment and Method: Stylet Placement Confirmation: ETT inserted through vocal cords under direct vision, positive ETCO2 and breath sounds checked- equal and bilateral Secured at: 22 cm Tube secured with: Tape Dental Injury: Teeth and Oropharynx as per pre-operative assessment

## 2021-07-27 NOTE — ED Triage Notes (Signed)
Pt BIB [facility] after unwitnessed entanglement with dresser. Patient found by staff hanging from dresser by left arm. EMS reports obvious deformity, repositioned and splinted en route. Unsure as to time hanging from [employer]. Wound present on left forearm, staff unable to verify age of wound. VS stable, EMS administered 140mcg fentanyl, patient reports no pain at this time. 20g R AC

## 2021-07-27 NOTE — ED Notes (Signed)
Patient reports eating breakfast but cannot confirm when or what she ate.

## 2021-07-27 NOTE — Assessment & Plan Note (Addendum)
Continue to hold statin for now.

## 2021-07-28 ENCOUNTER — Encounter (HOSPITAL_COMMUNITY)
Admission: EM | Disposition: A | Discharge status: Skilled Nursing Facility | Payer: Self-pay | Source: Home / Self Care | Attending: Internal Medicine

## 2021-07-28 DIAGNOSIS — D62 Acute posthemorrhagic anemia: Secondary | ICD-10-CM

## 2021-07-28 DIAGNOSIS — N179 Acute kidney failure, unspecified: Secondary | ICD-10-CM

## 2021-07-28 DIAGNOSIS — G309 Alzheimer's disease, unspecified: Secondary | ICD-10-CM

## 2021-07-28 DIAGNOSIS — I7121 Aneurysm of the ascending aorta, without rupture: Secondary | ICD-10-CM

## 2021-07-28 DIAGNOSIS — F0280 Dementia in other diseases classified elsewhere without behavioral disturbance: Secondary | ICD-10-CM

## 2021-07-28 DIAGNOSIS — D72829 Elevated white blood cell count, unspecified: Secondary | ICD-10-CM

## 2021-07-28 DIAGNOSIS — E785 Hyperlipidemia, unspecified: Secondary | ICD-10-CM

## 2021-07-28 LAB — URINALYSIS, ROUTINE W REFLEX MICROSCOPIC
Bilirubin Urine: NEGATIVE
Glucose, UA: NEGATIVE mg/dL
Ketones, ur: NEGATIVE mg/dL
Leukocytes,Ua: NEGATIVE
Nitrite: NEGATIVE
Protein, ur: 100 mg/dL — AB
Specific Gravity, Urine: 1.016 (ref 1.005–1.030)
pH: 5 (ref 5.0–8.0)

## 2021-07-28 LAB — CBC
HCT: 20.5 % — ABNORMAL LOW (ref 36.0–46.0)
Hemoglobin: 6 g/dL — CL (ref 12.0–15.0)
MCH: 30.3 pg (ref 26.0–34.0)
MCHC: 29.3 g/dL — ABNORMAL LOW (ref 30.0–36.0)
MCV: 103.5 fL — ABNORMAL HIGH (ref 80.0–100.0)
Platelets: 354 10*3/uL (ref 150–400)
RBC: 1.98 MIL/uL — ABNORMAL LOW (ref 3.87–5.11)
RDW: 19.3 % — ABNORMAL HIGH (ref 11.5–15.5)
WBC: 12.3 10*3/uL — ABNORMAL HIGH (ref 4.0–10.5)
nRBC: 0.7 % — ABNORMAL HIGH (ref 0.0–0.2)

## 2021-07-28 LAB — IRON AND TIBC
Iron: 13 ug/dL — ABNORMAL LOW (ref 28–170)
Saturation Ratios: 5 % — ABNORMAL LOW (ref 10.4–31.8)
TIBC: 246 ug/dL — ABNORMAL LOW (ref 250–450)
UIBC: 233 ug/dL

## 2021-07-28 LAB — BASIC METABOLIC PANEL
Anion gap: 7 (ref 5–15)
BUN: 29 mg/dL — ABNORMAL HIGH (ref 8–23)
CO2: 19 mmol/L — ABNORMAL LOW (ref 22–32)
Calcium: 7.2 mg/dL — ABNORMAL LOW (ref 8.9–10.3)
Chloride: 110 mmol/L (ref 98–111)
Creatinine, Ser: 1.14 mg/dL — ABNORMAL HIGH (ref 0.44–1.00)
GFR, Estimated: 49 mL/min — ABNORMAL LOW (ref >60)
Glucose, Bld: 146 mg/dL — ABNORMAL HIGH (ref 70–99)
Potassium: 5 mmol/L (ref 3.5–5.1)
Sodium: 136 mmol/L (ref 135–145)

## 2021-07-28 LAB — PREPARE RBC (CROSSMATCH)

## 2021-07-28 LAB — FOLATE: Folate: 19.1 ng/mL (ref >5.9)

## 2021-07-28 LAB — CK: Total CK: 39443 U/L — ABNORMAL HIGH (ref 38–234)

## 2021-07-28 LAB — MAGNESIUM: Magnesium: 2.2 mg/dL (ref 1.7–2.4)

## 2021-07-28 LAB — FERRITIN: Ferritin: 48 ng/mL (ref 11–307)

## 2021-07-28 LAB — VITAMIN B12: Vitamin B-12: 391 pg/mL (ref 180–914)

## 2021-07-28 LAB — PHOSPHORUS: Phosphorus: 5.9 mg/dL — ABNORMAL HIGH (ref 2.5–4.6)

## 2021-07-28 SURGERY — OPEN REDUCTION INTERNAL FIXATION (ORIF) HUMERAL SHAFT FRACTURE
Anesthesia: General | Laterality: Left

## 2021-07-28 MED ORDER — ACETAMINOPHEN 500 MG PO TABS
500.0000 mg | ORAL_TABLET | Freq: Four times a day (QID) | ORAL | Status: AC
  Administered 2021-07-28 (×4): 500 mg via ORAL
  Filled 2021-07-28 (×4): qty 1

## 2021-07-28 MED ORDER — CEFAZOLIN SODIUM-DEXTROSE 2-4 GM/100ML-% IV SOLN
2.0000 g | Freq: Four times a day (QID) | INTRAVENOUS | Status: AC
  Administered 2021-07-28 (×3): 2 g via INTRAVENOUS
  Filled 2021-07-28 (×3): qty 100

## 2021-07-28 MED ORDER — SODIUM CHLORIDE 0.9% IV SOLUTION: Freq: Once | INTRAVENOUS | Status: DC

## 2021-07-28 MED ORDER — CEFAZOLIN SODIUM-DEXTROSE 2-4 GM/100ML-% IV SOLN
2.0000 g | Freq: Once | INTRAVENOUS | Status: AC
Start: 2021-07-29 — End: 2021-07-29
  Administered 2021-07-29: 09:00:00 2 g via INTRAVENOUS
  Filled 2021-07-28: qty 100

## 2021-07-28 MED ORDER — BISACODYL 10 MG RE SUPP
10.0000 mg | Freq: Every day | RECTAL | Status: DC | PRN
  Filled 2021-07-28: qty 1

## 2021-07-28 MED ORDER — ACETAMINOPHEN 325 MG PO TABS: 325.0000 mg | ORAL_TABLET | Freq: Four times a day (QID) | ORAL | Status: DC | PRN

## 2021-07-28 MED ORDER — METOCLOPRAMIDE HCL 5 MG PO TABS: 5.0000 mg | ORAL_TABLET | Freq: Three times a day (TID) | ORAL | Status: DC | PRN

## 2021-07-28 MED ORDER — CHLORHEXIDINE GLUCONATE CLOTH 2 % EX PADS
6.0000 | MEDICATED_PAD | Freq: Every day | CUTANEOUS | Status: DC
  Administered 2021-07-28 – 2021-08-07 (×8): 6 via TOPICAL

## 2021-07-28 MED ORDER — DOCUSATE SODIUM 100 MG PO CAPS
100.0000 mg | ORAL_CAPSULE | Freq: Two times a day (BID) | ORAL | Status: DC
  Administered 2021-07-28 – 2021-08-06 (×12): 100 mg via ORAL
  Filled 2021-07-28 (×16): qty 1

## 2021-07-28 MED ORDER — ENOXAPARIN SODIUM 30 MG/0.3ML IJ SOSY
30.0000 mg | PREFILLED_SYRINGE | INTRAMUSCULAR | Status: DC
  Administered 2021-07-28: 30 mg via SUBCUTANEOUS
  Filled 2021-07-28: qty 0.3

## 2021-07-28 MED ORDER — METOCLOPRAMIDE HCL 5 MG/ML IJ SOLN: 5.0000 mg | Freq: Three times a day (TID) | INTRAMUSCULAR | Status: DC | PRN

## 2021-07-28 MED ORDER — DIPHENHYDRAMINE HCL 12.5 MG/5ML PO ELIX: 12.5000 mg | ORAL_SOLUTION | ORAL | Status: DC | PRN

## 2021-07-28 MED ORDER — SENNA 8.6 MG PO TABS
1.0000 | ORAL_TABLET | Freq: Two times a day (BID) | ORAL | Status: DC
  Administered 2021-07-28 – 2021-08-06 (×14): 8.6 mg via ORAL
  Filled 2021-07-28 (×16): qty 1

## 2021-07-28 MED ORDER — MORPHINE SULFATE (PF) 2 MG/ML IV SOLN
0.5000 mg | INTRAVENOUS | Status: DC | PRN
  Administered 2021-07-30: 16:00:00 1 mg via INTRAVENOUS
  Administered 2021-08-02: 0.5 mg via INTRAVENOUS
  Administered 2021-08-03 – 2021-08-04 (×3): 1 mg via INTRAVENOUS
  Filled 2021-07-28 (×5): qty 1

## 2021-07-28 MED ORDER — HYDROCODONE-ACETAMINOPHEN 5-325 MG PO TABS
1.0000 | ORAL_TABLET | ORAL | Status: DC | PRN
  Administered 2021-07-28 – 2021-07-30 (×2): 1 via ORAL
  Administered 2021-08-01 (×2): 2 via ORAL
  Administered 2021-08-01: 1 via ORAL
  Administered 2021-08-02 – 2021-08-03 (×5): 2 via ORAL
  Administered 2021-08-04 – 2021-08-05 (×4): 1 via ORAL
  Administered 2021-08-06: 2 via ORAL
  Administered 2021-08-07 (×2): 1 via ORAL
  Filled 2021-07-28: qty 1
  Filled 2021-07-28: qty 2
  Filled 2021-07-28 (×2): qty 1
  Filled 2021-07-28: qty 2
  Filled 2021-07-28: qty 1
  Filled 2021-07-28: qty 2
  Filled 2021-07-28: qty 1
  Filled 2021-07-28: qty 2
  Filled 2021-07-28: qty 1
  Filled 2021-07-28: qty 2
  Filled 2021-07-28: qty 1
  Filled 2021-07-28 (×2): qty 2
  Filled 2021-07-28: qty 1
  Filled 2021-07-28 (×2): qty 2
  Filled 2021-07-28: qty 1

## 2021-07-28 MED ORDER — POLYETHYLENE GLYCOL 3350 17 G PO PACK
17.0000 g | PACK | Freq: Every day | ORAL | Status: DC | PRN
  Administered 2021-08-04 – 2021-08-06 (×2): 17 g via ORAL
  Filled 2021-07-28 (×2): qty 1

## 2021-07-28 MED ORDER — ORAL CARE MOUTH RINSE
15.0000 mL | Freq: Two times a day (BID) | OROMUCOSAL | Status: DC
  Administered 2021-07-28 – 2021-08-07 (×18): 15 mL via OROMUCOSAL

## 2021-07-28 MED ORDER — SODIUM ZIRCONIUM CYCLOSILICATE 5 G PO PACK
5.0000 g | PACK | Freq: Once | ORAL | Status: AC
  Administered 2021-07-28: 5 g via ORAL
  Filled 2021-07-28: qty 1

## 2021-07-28 NOTE — Progress Notes (Signed)
Physical Therapy Treatment Patient Details Name: [name] MRN: [id number] DOB: 1941/03/02 Today's Date: 07/28/2021   History of Present Illness Patient is a 81 y/o female who presents on 07/27/21 with left arm trauma after getting LUE stuck in a dresser. Found to have left humerus fx and rhabdomyolosis. s/p fasciotomy, external fixator and wound vac placement 07/27/21. PMH includes dementia, osteoporosis, anxiety, depression, HTN, skin ca, HTN.    PT Comments    Seen pt for second session as nurse tech needed assist with pericare and clean up. Pt requires total A of 2 for rolling to right/left for pericare. Difficulty rolling to left due to external fixator and difficulty rolling towards right due to pain with left hip movement/touch. Pt noted to have some air bubble blisters on left inner thigh; removed foam pad due to it being covered in stool. Worked on [employer]. Continues to be appropriate for SNF. Will follow.    Recommendations for follow up therapy are one component of a multi-disciplinary discharge planning process, led by the attending physician.  Recommendations may be updated based on patient status, additional functional criteria and insurance authorization.  Follow Up Recommendations  Skilled nursing-short term rehab (<3 hours/day)     Assistance Recommended at Discharge    Patient can return home with the following Two people to help with walking and/or transfers;Assistance with cooking/housework;Direct supervision/assist for medications management;Assist for transportation;Help with stairs or ramp for entrance;Direct supervision/assist for financial management;Two people to help with bathing/dressing/bathroom   Equipment Recommendations  Other (comment) (efer to SNF)    Recommendations for Other Services       Precautions / Restrictions Precautions Precautions: Fall Required Braces or Orthoses: Other Brace Other Brace: external fixator Restrictions Weight  Bearing Restrictions: Yes LUE Weight Bearing: Non weight bearing Other Position/Activity Restrictions: hand/digit motion allowed, no elbow/shoulder until post-surgery per Ortho surgeon     Mobility  Bed Mobility Overal bed mobility: Needs Assistance Bed Mobility: Rolling Rolling: Total assist, +2 for physical assistance Sidelying to sit: Max assist, +2 for physical assistance, HOB elevated   Sit to supine: Max assist, +2 for physical assistance, HOB elevated   General bed mobility comments: Assisted nurse tech with pericare; rolling to right/left with total A of 2, difficulty rolling left due to external fixator, difficulty rolling right due to left hip pain/discomfort to touch.    Transfers Overall transfer level: Needs assistance Equipment used: 2 person hand held assist Transfers: Sit to/from Stand Sit to Stand: Total assist, +2 physical assistance           General transfer comment: Attempted to stand from EOB x2 however pt with minimal effort and not able to lift bottom at all.    Ambulation/Gait               General Gait Details: UNable   Stairs             Wheelchair Mobility    Modified Rankin (Stroke Patients Only)       Balance Overall balance assessment: Needs assistance Sitting-balance support: Feet supported, No upper extremity supported Sitting balance-Leahy Scale: Poor Sitting balance - Comments: Mod A to Min guard assist sitting EOB   Standing balance support: During functional activity Standing balance-Leahy Scale: Zero Standing balance comment: Not able to stand                            Cognition Arousal/Alertness: Awake/alert Behavior During Therapy: [facility] for  tasks assessed/performed Overall Cognitive Status: History of cognitive impairments - at baseline                                 General Comments: Hx of dementia at baseline, follows simple commands with repetition and increased time;  difficulty with sequencing and initiation of movement. Oriented to person and place.        Exercises General Exercises - Lower Extremity Heel Slides: AAROM, Both, 5 reps, Supine    General Comments General comments (skin integrity, edema, etc.): SpO2 at 87% on RA during session,2L O2 reapplied      Pertinent Vitals/Pain Pain Assessment Pain Assessment: Faces Faces Pain Scale: Hurts whole lot Pain Location: LUE /LLE with movement Pain Descriptors / Indicators: Grimacing, Guarding, Operative site guarding, Moaning Pain Intervention(s): Monitored during session, Repositioned, Limited activity within patient's tolerance    Home Living Family/patient expects to be discharged to:: Skilled nursing facility                   Additional Comments: [facility],    Prior Function            PT Goals (current goals can now be found in the care plan section) Progress towards PT goals: Not progressing toward goals - comment    Frequency    Min 2X/week      PT Plan Current plan remains appropriate    Co-evaluation   Reason for Co-Treatment: Complexity of the patient's impairments (multi-system involvement);For patient/therapist safety;To address functional/ADL transfers PT goals addressed during session: Mobility/safety with mobility OT goals addressed during session: ADL's and self-care;Strengthening/ROM      AM-PAC PT "6 Clicks" Mobility   Outcome Measure  Help needed turning from your back to your side while in a flat bed without using bedrails?: Total Help needed moving from lying on your back to sitting on the side of a flat bed without using bedrails?: Total Help needed moving to and from a bed to a chair (including a wheelchair)?: Total Help needed standing up from a chair using your arms (e.g., wheelchair or bedside chair)?: Total Help needed to walk in hospital room?: Total Help needed climbing 3-5 steps with a railing? : Total 6 Click Score: 6     End of Session   Activity Tolerance: Patient limited by pain Patient left: in bed;with call bell/phone within reach;with nursing/sitter in room;with bed alarm set;with family/visitor present Nurse Communication: Mobility status;Need for lift equipment PT Visit Diagnosis: Pain;Muscle weakness (generalized) (M62.81);Other abnormalities of gait and mobility (R26.89) Pain - Right/Left: Left Pain - part of body: Shoulder;Arm;Hip     Time: 6629-4765 PT Time Calculation (min) (ACUTE ONLY): 15 min  Charges:  $Therapeutic Activity: 8-22 mins                     Marisa Severin, PT, DPT Acute Rehabilitation Services Pager [phone] Office [phone]      Marguarite Arbour A Sabra Heck 07/28/2021, 3:34 PM

## 2021-07-28 NOTE — Evaluation (Addendum)
Occupational Therapy Evaluation Patient Details Name: [name] MRN: [id number] DOB: [birth year] Today's Date: 07/28/2021   History of Present Illness Patient is a 81 y/o female who presents on 07/27/21 with left arm trauma after getting LUE stuck in a dresser. Found to have left humerus fx and rhabdomyolosis. s/p fasciotomy, external fixator and wound vac placement 07/27/21. Planning for closure later this week. PMH includes dementia, osteoporosis, anxiety, depression, HTN, skin ca, HTN.   Clinical Impression   Pt from ILF, poor/questionable historian due to dementia dx, presenting with LUE impairments affecting ability to perform ADLs/mobility this session. Pt currently mod-max A +2 for ADLs, max A +2 for bed mobility and transfers. Pt demonstrates poor sitting balance, frequently leaning posterior/R side requiring tactile cues and increased support to remain upright. Pt presenting with impairments listed below, will  continue to follow acutely. Recommend d/c to SNF.     Recommendations for follow up therapy are one component of a multi-disciplinary discharge planning process, led by the attending physician.  Recommendations may be updated based on patient status, additional functional criteria and insurance authorization.   Follow Up Recommendations  Skilled nursing-short term rehab (<3 hours/day)    Assistance Recommended at Discharge Frequent or constant Supervision/Assistance  Patient can return home with the following Two people to help with walking and/or transfers;A lot of help with bathing/dressing/bathroom;Assistance with cooking/housework;Assist for transportation;Help with stairs or ramp for entrance    Functional Status Assessment  Patient has had a recent decline in their functional status and demonstrates the ability to make significant improvements in function in a reasonable and predictable amount of time.  Equipment Recommendations  None recommended by OT;Other (comment)  (defer to next venue of care)    Recommendations for Other Services       Precautions / Restrictions Precautions Precautions: Fall Required Braces or Orthoses: Other Brace Other Brace: external fixator Restrictions Weight Bearing Restrictions: Yes LUE Weight Bearing: Non weight bearing Other Position/Activity Restrictions: hand/digit motion allowed, no elbow/shoulder until post-surgery per Ortho surgeon      Mobility Bed Mobility Overal bed mobility: Needs Assistance Bed Mobility: Rolling, Sidelying to Sit, Sit to Supine Rolling: Max assist, +2 for physical assistance Sidelying to sit: Max assist, +2 for physical assistance, HOB elevated   Sit to supine: Max assist, +2 for physical assistance, HOB elevated        Transfers Overall transfer level: Needs assistance Equipment used: 2 person hand held assist Transfers: Sit to/from Stand Sit to Stand: Total assist, +2 physical assistance                  Balance Overall balance assessment: Needs assistance Sitting-balance support: Feet supported, No upper extremity supported Sitting balance-Leahy Scale: Poor Sitting balance - Comments: leans posterior/rt side   Standing balance support: During functional activity Standing balance-Leahy Scale: Zero Standing balance comment: Not able to stand                           ADL either performed or assessed with clinical judgement   ADL Overall ADL's : Needs assistance/impaired Eating/Feeding: Minimal assistance;Sitting   Grooming: Sitting;Moderate assistance   Upper Body Bathing: Moderate assistance;Sitting;Bed level   Lower Body Bathing: Maximal assistance;Bed level   Upper Body Dressing : Moderate assistance;Sitting;Bed level   Lower Body Dressing: Maximal assistance;Bed level Lower Body Dressing Details (indicate cue type and reason): don socks Toilet Transfer: Maximal assistance;+2 for physical assistance;Stand-pivot;BSC/3in1   Toileting-  Clothing Manipulation and  Hygiene: Maximal assistance;Bed level;Sitting/lateral lean       Functional mobility during ADLs: Moderate assistance;Maximal assistance;+2 for physical assistance       Vision   Vision Assessment?: No apparent visual deficits     Perception     Praxis      Pertinent Vitals/Pain Pain Assessment Pain Assessment: Faces Pain Score: 6  Pain Location: LUE with movement Pain Descriptors / Indicators: Grimacing, Guarding, Operative site guarding, Moaning Pain Intervention(s): Monitored during session, Limited activity within patient's tolerance, Repositioned     Hand Dominance Right   Extremity/Trunk Assessment Upper Extremity Assessment Upper Extremity Assessment: LUE deficits/detail LUE Deficits / Details: unable to fully assess due to external fixator, pt unable to actively move digits or wrist LUE: Unable to fully assess due to pain;Unable to fully assess due to immobilization LUE Sensation: decreased light touch LUE Coordination: decreased fine motor;decreased gross motor   Lower Extremity Assessment Lower Extremity Assessment: Generalized weakness   Cervical / Trunk Assessment Cervical / Trunk Assessment: Normal   Communication Communication Communication: HOH   Cognition Arousal/Alertness: Awake/alert Behavior During Therapy: WFL for tasks assessed/performed Overall Cognitive Status: History of cognitive impairments - at baseline                                 General Comments: Hx of dementia at baseline, follows simple commands with repetition and increased time; difficulty with sequencing and initiation of movement. Oriented to person and place. Pt stated she had already walked around her room/been up to the bathroom this morning     General Comments  SpO2 at 87% on RA during session,2L O2 reapplied    Exercises     Shoulder Instructions      Home Living Family/patient expects to be discharged to:: Skilled nursing  facility                                 Additional Comments: [facility],      Prior Functioning/Environment Prior Level of Function : Patient poor historian/Family not available             Mobility Comments: walks with RW as needed per chart          OT Problem List: Decreased strength;Decreased range of motion      OT Treatment/Interventions: Therapeutic activities;Patient/family education;Balance training;DME and/or AE instruction;Energy conservation;Self-care/ADL training;Therapeutic exercise    OT Goals(Current goals can be found in the care plan section) Acute Rehab OT Goals Patient Stated Goal: none stated OT Goal Formulation: Patient unable to participate in goal setting Time For Goal Achievement: 08/11/21 Potential to Achieve Goals: Good ADL Goals Pt Will Perform Grooming: with min assist;sitting Pt Will Perform Upper Body Dressing: with min assist;sitting Pt Will Transfer to Toilet: with mod assist;with +2 assist;stand pivot transfer  OT Frequency: Min 3X/week    Co-evaluation PT/OT/SLP Co-Evaluation/Treatment: Yes Reason for Co-Treatment: Complexity of the patient's impairments (multi-system involvement);For patient/therapist safety;To address functional/ADL transfers PT goals addressed during session: Mobility/safety with mobility OT goals addressed during session: ADL's and self-care;Strengthening/ROM      AM-PAC OT "6 Clicks" Daily Activity     Outcome Measure Help from another person eating meals?: A Little Help from another person taking care of personal grooming?: A Little Help from another person toileting, which includes using toliet, bedpan, or urinal?: Total Help from another person bathing (including washing,  rinsing, drying)?: Total Help from another person to put on and taking off regular upper body clothing?: A Lot Help from another person to put on and taking off regular lower body clothing?: Total 6 Click Score:  11   End of Session Equipment Utilized During Treatment: Gait belt;Oxygen Nurse Communication: Mobility status  Activity Tolerance: Patient limited by fatigue;Patient limited by pain Patient left: in bed;with call bell/phone within reach;with bed alarm set  OT Visit Diagnosis: Unsteadiness on feet (R26.81);Pain;Other abnormalities of gait and mobility (R26.89);Muscle weakness (generalized) (M62.81) Pain - Right/Left: Left Pain - part of body: Arm                Time: 4944-7395 OT Time Calculation (min): 25 min Charges:  OT General Charges $OT Visit: 1 Visit OT Evaluation $OT Eval Moderate Complexity: [address], OTD, OTR/L Acute Rehab [phone] - [place] 07/28/2021, 12:34 PM

## 2021-07-28 NOTE — Evaluation (Addendum)
Physical Therapy Evaluation Patient Details Name: [name] MRN: [id number] DOB: 08-09-1940 Today's Date: 07/28/2021  History of Present Illness  Patient is a 81 y/o female who presents on 07/27/21 with left arm trauma after getting LUE stuck in a dresser. Found to have left humerus fx and rhabdomyolosis. s/p fasciotomy, external fixator and wound vac placement 07/27/21. PMH includes dementia, osteoporosis, anxiety, depression, HTN, skin ca, HTN.  Clinical Impression  Patient presents with pain, post surgical deficits LUE s/p above surgery, impaired balance, generalized weakness and impaired mobility s/p above. Pt is from [place] and not the best historian due to hx of dementia. But per chart, pt using RW for ambulation.  Today, pt requires assist of 2 for bed mobility and unable to stand despite 2 person assist. Pt not able to move digits on LUe today. Education on NWB LUE and importance of elevation/pillow placement. Sp02 dropped to 87% on RA with activity so donned 02 at end of session. Would benefit from SNF to maximize independence and mobility prior to return home. Will follow acutely.     Recommendations for follow up therapy are one component of a multi-disciplinary discharge planning process, led by the attending physician.  Recommendations may be updated based on patient status, additional functional criteria and insurance authorization.  Follow Up Recommendations Skilled nursing-short term rehab (<3 hours/day)    Assistance Recommended at Discharge Frequent or constant Supervision/Assistance  Patient can return home with the following  Two people to help with walking and/or transfers;Assistance with cooking/housework;Direct supervision/assist for medications management;Assist for transportation;Help with stairs or ramp for entrance;Direct supervision/assist for financial management;Two people to help with bathing/dressing/bathroom    Equipment Recommendations Other  (comment) (defer to next venue)  Recommendations for Other Services       Functional Status Assessment Patient has had a recent decline in their functional status and/or demonstrates limited ability to make significant improvements in function in a reasonable and predictable amount of time     Precautions / Restrictions Precautions Precautions: Fall Required Braces or Orthoses: Other Brace Other Brace: external fixator Restrictions Weight Bearing Restrictions: Yes LUE Weight Bearing: Non weight bearing      Mobility  Bed Mobility Overal bed mobility: Needs Assistance Bed Mobility: Rolling, Sidelying to Sit, Sit to Supine Rolling: Max assist, +2 for physical assistance Sidelying to sit: Max assist, +2 for physical assistance, HOB elevated   Sit to supine: Max assist, +2 for physical assistance, HOB elevated   General bed mobility comments: step by step cues for sequencing, assist with LEs, trunk and scooting bottom to EOB. Assist to return to supine. Not able to assist much.    Transfers Overall transfer level: Needs assistance Equipment used: 2 person hand held assist Transfers: Sit to/from Stand Sit to Stand: Total assist, +2 physical assistance           General transfer comment: Attempted to stand from EOB x2 however pt with minimal effort and not able to lift bottom at all.    Ambulation/Gait               General Gait Details: UNable  Stairs            Wheelchair Mobility    Modified Rankin (Stroke Patients Only)       Balance Overall balance assessment: Needs assistance Sitting-balance support: Feet supported, No upper extremity supported Sitting balance-Leahy Scale: Poor Sitting balance - Comments: Mod A to Min guard assist sitting EOB   Standing balance support: During  functional activity Standing balance-Leahy Scale: Zero Standing balance comment: Not able to stand                             Pertinent Vitals/Pain Pain  Assessment Pain Assessment: Faces Faces Pain Scale: Hurts even more Pain Location: LUE with movement Pain Descriptors / Indicators: Grimacing, Guarding, Operative site guarding, Moaning Pain Intervention(s): Monitored during session, Repositioned, Limited activity within patient's tolerance    Home Living Family/patient expects to be discharged to:: Skilled nursing facility                        Prior Function Prior Level of Function : Patient poor historian/Family not available             Mobility Comments: walks with RW as needed per chart       Hand Dominance   Dominant Hand: Right    Extremity/Trunk Assessment   Upper Extremity Assessment Upper Extremity Assessment: Defer to OT evaluation;LUE deficits/detail LUE Deficits / Details: Not able to move digits or wrist; swelling present. LUE: Unable to fully assess due to immobilization LUE Sensation: decreased light touch    Lower Extremity Assessment Lower Extremity Assessment: Generalized weakness       Communication   Communication: HOH  Cognition Arousal/Alertness: Awake/alert Behavior During Therapy: WFL for tasks assessed/performed Overall Cognitive Status: History of cognitive impairments - at baseline                                 General Comments: Hx of dementia at baseline, follows simple commands with repetition and increased time; difficulty with sequencing and initiation of movement. Oriented to person and place.        General Comments General comments (skin integrity, edema, etc.): SP02 dropped to 87% on RA during session; donned 02 at 2L.    Exercises     Assessment/Plan    PT Assessment Patient needs continued PT services  PT Problem List Decreased strength;Decreased mobility;Decreased range of motion;Decreased safety awareness;Decreased skin integrity;Cardiopulmonary status limiting activity;Decreased cognition;Decreased activity tolerance;Decreased  balance;Impaired sensation;Pain;Decreased knowledge of precautions       PT Treatment Interventions Therapeutic activities;Patient/family education;Modalities;DME instruction;Therapeutic exercise;Gait training;Balance training;Wheelchair mobility training;Neuromuscular re-education;Functional mobility training    PT Goals (Current goals can be found in the Care Plan section)  Acute Rehab PT Goals Patient Stated Goal: none stated PT Goal Formulation: Patient unable to participate in goal setting Time For Goal Achievement: 08/11/21 Potential to Achieve Goals: Fair    Frequency Min 2X/week     Co-evaluation PT/OT/SLP Co-Evaluation/Treatment: Yes Reason for Co-Treatment: Necessary to address cognition/behavior during functional activity;To address functional/ADL transfers;For patient/therapist safety PT goals addressed during session: Mobility/safety with mobility         AM-PAC PT "6 Clicks" Mobility  Outcome Measure Help needed turning from your back to your side while in a flat bed without using bedrails?: Total Help needed moving from lying on your back to sitting on the side of a flat bed without using bedrails?: Total Help needed moving to and from a bed to a chair (including a wheelchair)?: Total Help needed standing up from a chair using your arms (e.g., wheelchair or bedside chair)?: Total Help needed to walk in hospital room?: Total Help needed climbing 3-5 steps with a railing? : Total 6 Click Score: 6    End of Session Equipment  Utilized During Treatment: Oxygen Activity Tolerance: Patient limited by pain;Patient tolerated treatment well Patient left: in bed;with call bell/phone within reach (bed alarm broken and bed broken) Nurse Communication: Mobility status;Need for lift equipment PT Visit Diagnosis: Pain;Muscle weakness (generalized) (M62.81);Other abnormalities of gait and mobility (R26.89) Pain - Right/Left: Left Pain - part of body: Shoulder;Arm    Time:  5183-3582 PT Time Calculation (min) (ACUTE ONLY): 26 min   Charges:   PT Evaluation $PT Eval Moderate Complexity: 1 Mod          Marisa Severin, PT, DPT Acute Rehabilitation Services Pager [phone] Office [phone]     Marguarite Arbour A Sabra Heck 07/28/2021, 9:59 AM

## 2021-07-28 NOTE — Anesthesia Postprocedure Evaluation (Signed)
Anesthesia Post Note  Patient: [name]  Procedure(s) Performed: FASCIOTOMY LEFT FOREARM (Left: Arm Lower) EXTERNAL FIXATION LEFT HUMERUS (Left: Arm Upper) APPLICATION OF WOUND VAC (Left)     Patient location during evaluation: PACU Anesthesia Type: General Level of consciousness: awake and alert Pain management: pain level controlled Vital Signs Assessment: post-procedure vital signs reviewed and stable Respiratory status: spontaneous breathing, nonlabored ventilation, respiratory function stable and patient connected to nasal cannula oxygen Cardiovascular status: blood pressure returned to baseline and stable Postop Assessment: no apparent nausea or vomiting Anesthetic complications: no   No notable events documented.  Last Vitals:  Vitals:   07/27/21 2345 07/28/21 0546  BP: 119/62 (!) 126/56  Pulse: 82 91  Resp: 18 14  Temp: 37 C 37.2 C  SpO2: 97% 96%    Last Pain:  Vitals:   07/27/21 2345  TempSrc:   PainSc: 0-No pain                 Belenda Cruise P Cedrik Heindl

## 2021-07-28 NOTE — TOC CAGE-AID Note (Signed)
Transition of Care [facility]) - CAGE-AID Screening   Patient Details  Name: [name] MRN: [id number] Date of Birth: 05/20/1941  Transition of Care [facility]) CM/SW Contact:    Leyana Whidden C Tarpley-C[place], [place] Phone Number: 07/28/2021, 11:04 AM   Clinical Narrative: Pt is unable to participate in Cage Aid. Pt has a history of dementia.  Pt would not be appropriate for assessment.  Vada Swift Tarpley-Carter, MSW, LCSW-A Pronouns:  She/Her/Hers [place] Transitions of Care Clinical Social Worker Direct Number:  [phone] [email or web address]  CAGE-AID Screening: Substance Abuse Screening unable to be completed due to: : Patient unable to participate             Substance Abuse Education Offered: No

## 2021-07-28 NOTE — TOC Initial Note (Signed)
Transition of Care [facility]) - Initial/Assessment Note    Patient Details  Name: [name] MRN: [id number] Date of Birth: May 27, 1941  Transition of Care [facility]) CM/SW Contact:    Sarah Chars, LCSW Phone Number: 07/28/2021, 2:46 PM  Clinical Narrative:    CSW met with pt and son regarding DC recommendation for SNF.  Permission given to speak with son [name].  They are agreeable to SNF, choice document given, permission given to send out referral in hub.  Pt in independent living at [facility] currently.  Pt scheduled for more surgery tomorrow, per RN.                 Expected Discharge Plan: Skilled Nursing Facility Barriers to Discharge: Continued Medical Work up, SNF Pending bed offer   Patient Goals and CMS Choice   CMS Medicare.gov Compare Post Acute Care list provided to:: Patient Represenative (must comment) Choice offered to / list presented to : Adult Children  Expected Discharge Plan and Services Expected Discharge Plan: [place] Choice: [place] Living arrangements for the past 2 months: [place] ([facility])                                      Prior Living Arrangements/Services Living arrangements for the past 2 months: [place] ([place]) Lives with:: Facility Resident Patient language and need for interpreter reviewed:: Yes Do you feel safe going back to the place where you live?: Yes      Need for Family Participation in Patient Care: Yes (Comment) Care giver support system in place?: Yes (comment) Current home services: Other (comment) (none) Criminal Activity/Legal Involvement Pertinent to Current Situation/Hospitalization: No - Comment as needed  Activities of Daily Living      Permission Sought/Granted Permission sought to share information with : Family Supports Permission granted to share information with : Yes, Verbal Permission  Granted  Share Information with NAME: son [name]  Permission granted to share info w AGENCY: SNF        Emotional Assessment Appearance:: Appears stated age Attitude/Demeanor/Rapport: Engaged Affect (typically observed): Pleasant Orientation: : Oriented to Self, Oriented to Place, Oriented to  Time, Oriented to Situation Alcohol / Substance Use: Not Applicable Psych Involvement: No (comment)  Admission diagnosis:  Pain [R52] Left humeral fracture [S42.302A] Contusion of left forearm, initial encounter [S50.12XA] Closed nondisplaced transverse fracture of shaft of left humerus, initial encounter [S42.325A] Traumatic rhabdomyolysis, initial encounter ([place]) [T79.6XXA] Abrasion of left upper extremity, initial encounter [S40.812A] Patient Active Problem List   Diagnosis Date Noted   Left humeral fracture 07/27/2021   Leukocytosis 07/27/2021   Anemia 07/27/2021   Thoracic ascending aortic aneurysm 07/27/2021   Hyperlipidemia 07/27/2021   Alzheimer disease ([place]) 07/09/2021   Vascular dementia ([place]) 07/09/2021   Fall    Elevated CK 04/28/2021   Traumatic rhabdomyolysis ([place])    GERD (gastroesophageal reflux disease) 08/19/2020   Hypokalemia 08/19/2020   Urinary frequency 08/19/2020   Weakness 08/19/2020   Dyspnea 08/19/2020   Troponin I above reference range 08/19/2020   Skin cancer 06/09/2017   Essential hypertension 08/26/2015   PCP:  Sarah Melter, MD Pharmacy:   [id number] [id number]: [phone]: [phone]    Social Determinants of Health (SDOH) Interventions    Readmission  Risk Interventions No flowsheet data found.

## 2021-07-28 NOTE — Progress Notes (Signed)
° ° ° °  Subjective:  Patient seen this afternoon.  Son at bedside.  Endorses minimal pain of the left arm.  Unable to move her fingers which is unchanged from preop.  Discussed with the patient and her son plan for return to the OR tomorrow to evaluate the forearm compartments and debride nonviable muscle if present.  Objective:   VITALS:   Vitals:   07/28/21 1100 07/28/21 1336 07/28/21 1415 07/28/21 1428  BP: 134/61 (!) 132/59 135/69 135/69  Pulse: 86 88 90 91  Resp: (!) 22 20 (!) 22 20  Temp: 98.7 F (37.1 C) 98.7 F (37.1 C) 98.7 F (37.1 C) 98.8 F (37.1 C)  TempSrc: Oral Oral Oral Oral  SpO2: 97% 95%  92%   Ex-Fix pin sites dressings clean dry and intact.  Upper arm and forearm compartments are soft.  Wound VAC dressing holding suction with three quarters full canister of serosanguineous output. Fingers warm well-perfused with brisk cap refill. Patient unable to demonstrate any motor or sensory function in the hand.  Lab Results  Component Value Date   WBC 12.3 (H) 07/28/2021   HGB 6.0 (LL) 07/28/2021   HCT 20.5 (L) 07/28/2021   MCV 103.5 (H) 07/28/2021   PLT 354 07/28/2021   BMET    Component Value Date/Time   NA 136 07/28/2021 0049   NA 143 06/07/2020 1630   K 5.0 07/28/2021 0049   CL 110 07/28/2021 0049   CO2 19 (L) 07/28/2021 0049   GLUCOSE 146 (H) 07/28/2021 0049   BUN 29 (H) 07/28/2021 0049   BUN 25 06/07/2020 1630   CREATININE 1.14 (H) 07/28/2021 0049   CALCIUM 7.2 (L) 07/28/2021 0049   GFRNONAA 49 (L) 07/28/2021 0049     Assessment/Plan: 1 Day Post-Op   Principal Problem:   Left humeral fracture Active Problems:   Essential hypertension   Traumatic rhabdomyolysis (HCC)   Alzheimer disease (HCC)   Leukocytosis   Anemia   Thoracic ascending aortic aneurysm   Hyperlipidemia  Status post left forearm fasciotomies for compartment syndrome, left humerus fracture closed reduction application of external fixator on 1/22  Post op recs: WB:  Nonweightbearing left upper extremity Abx: ancef x23 hours post op Imaging: PACU xrays Dressing: Wound VAC forearm 125 mmHg continuous DVT prophylaxis: lovenox starting POD1 x4 weeks, held for OR 1/24. Disposition: Plan to return to the OR 1/24 for repeat evaluation of left forearm status post fasciotomies possible debridement of nonviable necrotic muscle, possible wound closure Address: [address], [place], [place] [address]  Office Phone: [phone]    Willaim Sheng 07/28/2021, 5:07 PM   Charlies Constable, MD  Contact information:   [phone] 7am-5pm epic message Dr. Zachery Dakins, or call office for patient follow up: [phone] After hours and holidays please check [email or web address] for group call information for [facility]

## 2021-07-28 NOTE — Progress Notes (Signed)
PROGRESS NOTE    [name]  SNK:[id number] DOB: Sep 21, 1940 DOA: 07/27/2021 PCP: Orpah Melter, MD    Brief Narrative:   [name] is a 81 y.o. female with medical history significant of HTN, alzheimers dementia, anemia,  anxiety and depression who presents to ED after fall with subsequent left arm trauma. History is from her son due to her dementia.  She has a dresser to the right of the bed that is close enough to the bed that she got her arm stuck in the drawer and her body slipped down. They found her on the floor and her left arm stuck in drawer. She fell sometime between 8:15am and 10:30am. Ems was already on site at 10:30 when they called her son.    With ems given fentanyl and straightened out humerus before transport.    Elbow xray: negative, humerus left xray: acute fx, left mid humerus,  Hip xray: negative for acute fx.   1/23 status post surgery on 07/27/2021.  Hemoglobin 6.0, getting 2 units of packed red blood cell transfusion this a.m.   Consultants:  Orthopedics  Procedures:   Antimicrobials:      Subjective: Patient denies any shortness of breath, chest pain.  Son at bedside.  Reports she is at her baseline as far as mental status goes.  Objective: Vitals:   07/28/21 0546 07/28/21 0832 07/28/21 1038 07/28/21 1100  BP: (!) 126/56 (!) 125/52 129/71 134/61  Pulse: 91 93  86  Resp: 14 18 (!) 24 (!) 22  Temp: 99 F (37.2 C) 98.1 F (36.7 C) 99.2 F (37.3 C) 98.7 F (37.1 C)  TempSrc:  Oral Oral Oral  SpO2: 96% 97% 97% 97%    Intake/Output Summary (Last 24 hours) at 07/28/2021 1111 Last data filed at 07/28/2021 0809 Gross per 24 hour  Intake 2205 ml  Output 888 ml  Net 1317 ml   There were no vitals filed for this visit.  Examination:  General exam: Appears calm and comfortable  Respiratory system: Clear to auscultation. Respiratory effort normal. Cardiovascular system: S1 & S2 heard, RRR. No gallops  Gastrointestinal system: Abdomen is  nondistended, soft and nontender.  Normal bowel sounds heard. Central nervous system: Alert and oriented x3 Extremities: No edema Psychiatry:  Mood & affect appropriate.     Data Reviewed: I have personally reviewed following labs and imaging studies  CBC: Recent Labs  Lab 07/27/21 1237 07/28/21 0049  WBC 20.5* 12.3*  NEUTROABS 18.0*  --   HGB 7.6* 6.0*  HCT 24.4* 20.5*  MCV 100.8* 103.5*  PLT 523* 937   Basic Metabolic Panel: Recent Labs  Lab 07/27/21 1237 07/28/21 0049  NA 134* 136  K 4.6 5.0  CL 106 110  CO2 20* 19*  GLUCOSE 155* 146*  BUN 30* 29*  CREATININE 1.10* 1.14*  CALCIUM 8.2* 7.2*  MG  --  2.2  PHOS  --  5.9*   GFR: CrCl cannot be calculated (Unknown ideal weight.). Liver Function Tests: No results for input(s): AST, ALT, ALKPHOS, BILITOT, PROT, ALBUMIN in the last 168 hours. No results for input(s): LIPASE, AMYLASE in the last 168 hours. No results for input(s): AMMONIA in the last 168 hours. Coagulation Profile: No results for input(s): INR, PROTIME in the last 168 hours. Cardiac Enzymes: Recent Labs  Lab 07/27/21 1237 07/28/21 0049  CKTOTAL 23,294* 39,443*   BNP (last 3 results) No results for input(s): PROBNP in the last 8760 hours. HbA1C: No results for input(s): HGBA1C in the last  72 hours. CBG: No results for input(s): GLUCAP in the last 168 hours. Lipid Profile: No results for input(s): CHOL, HDL, LDLCALC, TRIG, CHOLHDL, LDLDIRECT in the last 72 hours. Thyroid Function Tests: No results for input(s): TSH, T4TOTAL, FREET4, T3FREE, THYROIDAB in the last 72 hours. Anemia Panel: Recent Labs    07/28/21 0049  VITAMINB12 391  FOLATE 19.1  FERRITIN 48  TIBC 246*  IRON 13*   Sepsis Labs: No results for input(s): PROCALCITON, LATICACIDVEN in the last 168 hours.  Recent Results (from the past 240 hour(s))  Resp Panel by RT-PCR (Flu A&B, Covid) Nasopharyngeal Swab     Status: None   Collection Time: 07/27/21 11:49 AM   Specimen:  Nasopharyngeal Swab; Nasopharyngeal(NP) swabs in vial transport medium  Result Value Ref Range Status   SARS Coronavirus 2 by RT PCR NEGATIVE NEGATIVE Final    Comment: (NOTE) SARS-CoV-2 target nucleic acids are NOT DETECTED.  The SARS-CoV-2 RNA is generally detectable in upper respiratory specimens during the acute phase of infection. The lowest concentration of SARS-CoV-2 viral copies this assay can detect is 138 copies/mL. A negative result does not preclude SARS-Cov-2 infection and should not be used as the sole basis for treatment or other patient management decisions. A negative result may occur with  improper specimen collection/handling, submission of specimen other than nasopharyngeal swab, presence of viral mutation(s) within the areas targeted by this assay, and inadequate number of viral copies(<138 copies/mL). A negative result must be combined with clinical observations, patient history, and epidemiological information. The expected result is Negative.  Fact Sheet for Patients:  EntrepreneurPulse.com.au  Fact Sheet for Healthcare Providers:  IncredibleEmployment.be  This test is no t yet approved or cleared by the [place] FDA and  has been authorized for detection and/or diagnosis of SARS-CoV-2 by FDA under an Emergency Use Authorization (EUA). This EUA will remain  in effect (meaning this test can be used) for the duration of the COVID-19 declaration under Section 564(b)(1) of the Act, 21 U.S.C.section 360bbb-3(b)(1), unless the authorization is terminated  or revoked sooner.       Influenza A by PCR NEGATIVE NEGATIVE Final   Influenza B by PCR NEGATIVE NEGATIVE Final    Comment: (NOTE) The Xpert Xpress SARS-CoV-2/FLU/RSV plus assay is intended as an aid in the diagnosis of influenza from Nasopharyngeal swab specimens and should not be used as a sole basis for treatment. Nasal washings and aspirates are unacceptable for  Xpert Xpress SARS-CoV-2/FLU/RSV testing.  Fact Sheet for Patients: EntrepreneurPulse.com.au  Fact Sheet for Healthcare Providers: IncredibleEmployment.be  This test is not yet approved or cleared by the [place] FDA and has been authorized for detection and/or diagnosis of SARS-CoV-2 by FDA under an Emergency Use Authorization (EUA). This EUA will remain in effect (meaning this test can be used) for the duration of the COVID-19 declaration under Section 564(b)(1) of the Act, 21 U.S.C. section 360bbb-3(b)(1), unless the authorization is terminated or revoked.  Performed at [facility], [place] [address] [place], [place] [address]          Radiology Studies: DG Chest 1 View  Result Date: 07/27/2021 CLINICAL DATA:  Unwitnessed fall EXAM: CHEST  1 VIEW COMPARISON:  04/27/2021 FINDINGS: Heart size and vascularity normal. Lungs clear without infiltrate or effusion. Moderate to advanced degenerative change in both shoulders. IMPRESSION: No active disease. Electronically Signed   By: Franchot Gallo M.D.   On: 07/27/2021 12:56   DG Elbow Complete Left  Result Date: 07/27/2021 CLINICAL  DATA:  Fall.  Deformity EXAM: LEFT ELBOW - COMPLETE 3+ VIEW COMPARISON:  None. FINDINGS: There is no evidence of fracture, dislocation, or joint effusion. There is no evidence of arthropathy or other focal bone abnormality. Soft tissues are unremarkable. IMPRESSION: Negative. Electronically Signed   By: Franchot Gallo M.D.   On: 07/27/2021 12:52   DG Forearm Left  Result Date: 07/27/2021 CLINICAL DATA:  Fall. EXAM: LEFT FOREARM - 2 VIEW COMPARISON:  None. FINDINGS: There is no evidence of fracture or other focal bone lesions. Soft tissues are unremarkable. IMPRESSION: Negative. Electronically Signed   By: Franchot Gallo M.D.   On: 07/27/2021 12:53   DG Humerus Left  Result Date: 07/27/2021 CLINICAL DATA:  Left humeral fracture, postoperative examination EXAM:  LEFT HUMERUS - 2+ VIEW COMPARISON:  None. FINDINGS: Acute transverse fracture of the a mid diaphysis of the left humerus is again identified. External fixator has been applied. There is 1/2 shaft with posterolateral displacement of the distal fracture fragment and distraction of the fracture fragments by approximately 3-4 mm. Normal alignment. IMPRESSION: Interval external fixation of mid diaphyseal left humeral fracture as described above. Electronically Signed   By: Fidela Salisbury M.D.   On: 07/27/2021 23:39   DG Humerus Left  Result Date: 07/27/2021 CLINICAL DATA:  Internal fixation of the left humerus. EXAM: LEFT HUMERUS - 2+ VIEW COMPARISON:  Radiographs dated 07/27/2021. FINDINGS: Seven intraoperative fluoroscopic images provided. The total fluoroscopic time is 1 minutes 15 seconds with total air kerma of 1.89 mGy. There has been internal fixation of left humeral fracture. IMPRESSION: Intraoperative fluoroscopic images of left humeral fixation. Electronically Signed   By: Anner Crete M.D.   On: 07/27/2021 22:10   DG Humerus Left  Result Date: 07/27/2021 CLINICAL DATA:  Fall EXAM: LEFT HUMERUS - 2+ VIEW COMPARISON:  None. FINDINGS: Acute transverse fracture mid shaft of left humerus with angulation and mild displacement. Advanced degenerative changes left shoulder joint. IMPRESSION: Acute fracture left mid humerus. Electronically Signed   By: Franchot Gallo M.D.   On: 07/27/2021 12:55   DG C-Arm 1-60 Min-No Report  Result Date: 07/27/2021 Fluoroscopy was utilized by the requesting physician.  No radiographic interpretation.   DG HIP UNILAT WITH PELVIS 2-3 VIEWS LEFT  Result Date: 07/27/2021 CLINICAL DATA:  Fall EXAM: DG HIP (WITH OR WITHOUT PELVIS) 2-3V LEFT COMPARISON:  04/27/2021 FINDINGS: Right hip replacement in satisfactory alignment. No fracture or complication. Advanced degenerative change left hip with joint space narrowing, spurring, sclerosis and collapse of the femoral head.  Negative for acute fracture Calcification aorta and iliac arteries. Bilateral fallopian tube clips. IMPRESSION: Negative for acute fracture. Advanced degenerative change left hip. Electronically Signed   By: Franchot Gallo M.D.   On: 07/27/2021 12:54        Scheduled Meds:  sodium chloride   Intravenous Once   acetaminophen  500 mg Oral Q6H   docusate sodium  100 mg Oral BID   enoxaparin (LOVENOX) injection  30 mg Subcutaneous Q24H   senna  1 tablet Oral BID   Continuous Infusions:  sodium chloride 125 mL/hr at 07/28/21 0736    ceFAZolin (ANCEF) IV 2 g (07/28/21 0736)   sodium chloride      Assessment & Plan:   Principal Problem:   Left humeral fracture Active Problems:   Essential hypertension   Traumatic rhabdomyolysis (HCC)   Alzheimer disease (HCC)   Leukocytosis   Anemia   Thoracic ascending aortic aneurysm   Hyperlipidemia   *  Left humeral fracture- (present on admission) S/p fasciotomy Lt forearm, external fixation Lt humerus, wound vac application on 6/26 Mx per ortho Plan for definitive treatment with compartment closure later this week with Ortho trauma specialist Lovenox for DVT prophylaxis on hold due to severe anemia SCD for now   Traumatic rhabdomyolysis ([place])- (present on admission) CK peaking at 39,443 Continue IV fluids for hydration monitor volume status Monitor labs   Acute blood loss anemia due to surgery Chronic anemia Hg 6.0 this am Will transfuse 2 PRBC today was d/w son Iron panel with low iron.  Please see May need to repeat after transfusion Unsure if patient has had a colonoscopy in the past   AKI Prerenal and rhabdo Continue with IV fluids Monitor closely K5.0 , since on upper nml end,will give lokelma low dose to prevent it going higher     Leukocytosis- (present on admission) Likely stress-induced/reactive Improving To monitor      Essential hypertension- (present on admission) BP stable continue to monitor    Alzheimer disease ([place])- (present on admission) At baseline, delirium precautions    Thoracic ascending aortic aneurysm- (present on admission) Noted and follows outpatient.    Hyperlipidemia Holding statin due to rhabdomyolysis       DVT prophylaxis: scd, hold lovenox due to acute blood loss Code Status:full Family Communication: son at bedside Disposition Plan:  Status is: Inpatient  Remains inpatient appropriate because: IV treatment.  Patient getting blood transfusion.  Status post postop day #1            LOS: 1 day   Time spent: 45 minutes with more than 50% on [place], MD [facility] Pager [phone]  If 7PM-7AM, please contact night-coverage 07/28/2021, 11:11 AM

## 2021-07-28 NOTE — Progress Notes (Signed)
Subjective: 1 Day Post-Op s/p Procedure(s): FASCIOTOMY LEFT FOREARM EXTERNAL FIXATION LEFT HUMERUS APPLICATION OF WOUND VAC   Patient is alert. Oriented to person and place. Reports no pain.  Denies chest pain, SOB, Calf pain. No nausea/vomiting. No other complaints.   Objective:  PE: VITALS:   Vitals:   07/27/21 2335 07/27/21 2340 07/27/21 2345 07/28/21 0546  BP:   119/62 (!) 126/56  Pulse: 85 84 82 91  Resp: (!) 23 18 18 14   Temp:   98.6 F (37 C) 99 F (37.2 C)  TempSrc:      SpO2: 97% 97% 97% 96%   General: laying in bed, in no acute distress MSK: LUE ex-fix in placed. Upper arm compartments are soft and compressible.   Anterior and posterior compartments of upper arm are soft and compressible.Wound vacs in place with 250 ml of blood tinged serous fluid in the canister. When asked to flex and extend fingers, make a fist, or perform wrist extension, patient is able to do so with right hand, but unable to do so with left. Patient endorses sensation to only her index finger. Capillary refill intact to all fingers.   LABS  Results for orders placed or performed during the hospital encounter of 07/27/21 (from the past 24 hour(s))  Resp Panel by RT-PCR (Flu A&B, Covid) Nasopharyngeal Swab     Status: None   Collection Time: 07/27/21 11:49 AM   Specimen: Nasopharyngeal Swab; Nasopharyngeal(NP) swabs in vial transport medium  Result Value Ref Range   SARS Coronavirus 2 by RT PCR NEGATIVE NEGATIVE   Influenza A by PCR NEGATIVE NEGATIVE   Influenza B by PCR NEGATIVE NEGATIVE  Brain natriuretic peptide     Status: Abnormal   Collection Time: 07/27/21 12:36 PM  Result Value Ref Range   B Natriuretic Peptide 951.9 (H) 0.0 - 100.0 pg/mL  CBC with Differential     Status: Abnormal   Collection Time: 07/27/21 12:37 PM  Result Value Ref Range   WBC 20.5 (H) 4.0 - 10.5 K/uL   RBC 2.42 (L) 3.87 - 5.11 MIL/uL   Hemoglobin 7.6 (L) 12.0 - 15.0 g/dL   HCT 24.4 (L) 36.0 - 46.0 %    MCV 100.8 (H) 80.0 - 100.0 fL   MCH 31.4 26.0 - 34.0 pg   MCHC 31.1 30.0 - 36.0 g/dL   RDW 18.3 (H) 11.5 - 15.5 %   Platelets 523 (H) 150 - 400 K/uL   nRBC 0.8 (H) 0.0 - 0.2 %   Neutrophils Relative % 87 %   Neutro Abs 18.0 (H) 1.7 - 7.7 K/uL   Lymphocytes Relative 5 %   Lymphs Abs 1.0 0.7 - 4.0 K/uL   Monocytes Relative 7 %   Monocytes Absolute 1.3 (H) 0.1 - 1.0 K/uL   Eosinophils Relative 0 %   Eosinophils Absolute 0.0 0.0 - 0.5 K/uL   Basophils Relative 0 %   Basophils Absolute 0.0 0.0 - 0.1 K/uL   Immature Granulocytes 1 %   Abs Immature Granulocytes 0.12 (H) 0.00 - 0.07 K/uL  CK     Status: Abnormal   Collection Time: 07/27/21 12:37 PM  Result Value Ref Range   Total CK 23,294 (H) 38 - 234 U/L  Basic metabolic panel     Status: Abnormal   Collection Time: 07/27/21 12:37 PM  Result Value Ref Range   Sodium 134 (L) 135 - 145 mmol/L   Potassium 4.6 3.5 - 5.1 mmol/L   Chloride 106 98 -  111 mmol/L   CO2 20 (L) 22 - 32 mmol/L   Glucose, Bld 155 (H) 70 - 99 mg/dL   BUN 30 (H) 8 - 23 mg/dL   Creatinine, Ser 1.10 (H) 0.44 - 1.00 mg/dL   Calcium 8.2 (L) 8.9 - 10.3 mg/dL   GFR, Estimated 51 (L) >60 mL/min   Anion gap 8 5 - 15  ABO/Rh     Status: None   Collection Time: 07/27/21 12:37 PM  Result Value Ref Range   ABO/RH(D)      O POS Performed at [facility] [address] [place], [place] [address]   Type and screen [place]     Status: None (Preliminary result)   Collection Time: 07/27/21  8:30 PM  Result Value Ref Range   ABO/RH(D) O POS    Antibody Screen NEG    Sample Expiration      07/30/2021,2359 Performed at [facility], [place] [address] [place], [place] [address]    Unit Number [id number]    Blood Component Type RED CELLS,LR    Unit division 00    Status of Unit ALLOCATED    Transfusion Status OK TO TRANSFUSE    Crossmatch Result Compatible    Unit Number [id number]    Blood Component Type RED CELLS,LR    Unit  division 00    Status of Unit ALLOCATED    Transfusion Status OK TO TRANSFUSE    Crossmatch Result Compatible   Magnesium     Status: None   Collection Time: 07/28/21 12:49 AM  Result Value Ref Range   Magnesium 2.2 1.7 - 2.4 mg/dL  Phosphorus     Status: Abnormal   Collection Time: 07/28/21 12:49 AM  Result Value Ref Range   Phosphorus 5.9 (H) 2.5 - 4.6 mg/dL  Ferritin     Status: None   Collection Time: 07/28/21 12:49 AM  Result Value Ref Range   Ferritin 48 11 - 307 ng/mL  Iron and TIBC     Status: Abnormal   Collection Time: 07/28/21 12:49 AM  Result Value Ref Range   Iron 13 (L) 28 - 170 ug/dL   TIBC 246 (L) 250 - 450 ug/dL   Saturation Ratios 5 (L) 10.4 - 31.8 %   UIBC 233 ug/dL  Vitamin B12     Status: None   Collection Time: 07/28/21 12:49 AM  Result Value Ref Range   Vitamin B-12 391 180 - 914 pg/mL  Folate, serum, performed at [facility] lab     Status: None   Collection Time: 07/28/21 12:49 AM  Result Value Ref Range   Folate 19.1 >5.9 ng/mL  Basic metabolic panel     Status: Abnormal   Collection Time: 07/28/21 12:49 AM  Result Value Ref Range   Sodium 136 135 - 145 mmol/L   Potassium 5.0 3.5 - 5.1 mmol/L   Chloride 110 98 - 111 mmol/L   CO2 19 (L) 22 - 32 mmol/L   Glucose, Bld 146 (H) 70 - 99 mg/dL   BUN 29 (H) 8 - 23 mg/dL   Creatinine, Ser 1.14 (H) 0.44 - 1.00 mg/dL   Calcium 7.2 (L) 8.9 - 10.3 mg/dL   GFR, Estimated 49 (L) >60 mL/min   Anion gap 7 5 - 15  CBC     Status: Abnormal   Collection Time: 07/28/21 12:49 AM  Result Value Ref Range   WBC 12.3 (H) 4.0 - 10.5 K/uL   RBC  1.98 (L) 3.87 - 5.11 MIL/uL   Hemoglobin 6.0 (LL) 12.0 - 15.0 g/dL   HCT 20.5 (L) 36.0 - 46.0 %   MCV 103.5 (H) 80.0 - 100.0 fL   MCH 30.3 26.0 - 34.0 pg   MCHC 29.3 (L) 30.0 - 36.0 g/dL   RDW 19.3 (H) 11.5 - 15.5 %   Platelets 354 150 - 400 K/uL   nRBC 0.7 (H) 0.0 - 0.2 %  CK     Status: Abnormal   Collection Time: 07/28/21 12:49 AM  Result Value Ref Range   Total CK  39,443 (H) 38 - 234 U/L  Prepare RBC (crossmatch)     Status: None   Collection Time: 07/28/21  2:30 AM  Result Value Ref Range   Order Confirmation      ORDER PROCESSED BY BLOOD BANK Performed at [facility], [address], [place], [place] [address]   Urinalysis, Routine w reflex microscopic Urine, Catheterized     Status: Abnormal   Collection Time: 07/28/21  5:02 AM  Result Value Ref Range   Color, Urine AMBER (A) YELLOW   APPearance HAZY (A) CLEAR   Specific Gravity, Urine 1.016 1.005 - 1.030   pH 5.0 5.0 - 8.0   Glucose, UA NEGATIVE NEGATIVE mg/dL   Hgb urine dipstick LARGE (A) NEGATIVE   Bilirubin Urine NEGATIVE NEGATIVE   Ketones, ur NEGATIVE NEGATIVE mg/dL   Protein, ur 100 (A) NEGATIVE mg/dL   Nitrite NEGATIVE NEGATIVE   Leukocytes,Ua NEGATIVE NEGATIVE   RBC / HPF 0-5 0 - 5 RBC/hpf   WBC, UA 0-5 0 - 5 WBC/hpf   Bacteria, UA RARE (A) NONE SEEN   Mucus PRESENT     DG Chest 1 View  Result Date: 07/27/2021 CLINICAL DATA:  Unwitnessed fall EXAM: CHEST  1 VIEW COMPARISON:  04/27/2021 FINDINGS: Heart size and vascularity normal. Lungs clear without infiltrate or effusion. Moderate to advanced degenerative change in both shoulders. IMPRESSION: No active disease. Electronically Signed   By: Franchot Gallo M.D.   On: 07/27/2021 12:56   DG Elbow Complete Left  Result Date: 07/27/2021 CLINICAL DATA:  Fall.  Deformity EXAM: LEFT ELBOW - COMPLETE 3+ VIEW COMPARISON:  None. FINDINGS: There is no evidence of fracture, dislocation, or joint effusion. There is no evidence of arthropathy or other focal bone abnormality. Soft tissues are unremarkable. IMPRESSION: Negative. Electronically Signed   By: Franchot Gallo M.D.   On: 07/27/2021 12:52   DG Forearm Left  Result Date: 07/27/2021 CLINICAL DATA:  Fall. EXAM: LEFT FOREARM - 2 VIEW COMPARISON:  None. FINDINGS: There is no evidence of fracture or other focal bone lesions. Soft tissues are unremarkable. IMPRESSION: Negative.  Electronically Signed   By: Franchot Gallo M.D.   On: 07/27/2021 12:53   DG Humerus Left  Result Date: 07/27/2021 CLINICAL DATA:  Left humeral fracture, postoperative examination EXAM: LEFT HUMERUS - 2+ VIEW COMPARISON:  None. FINDINGS: Acute transverse fracture of the a mid diaphysis of the left humerus is again identified. External fixator has been applied. There is 1/2 shaft with posterolateral displacement of the distal fracture fragment and distraction of the fracture fragments by approximately 3-4 mm. Normal alignment. IMPRESSION: Interval external fixation of mid diaphyseal left humeral fracture as described above. Electronically Signed   By: Fidela Salisbury M.D.   On: 07/27/2021 23:39   DG Humerus Left  Result Date: 07/27/2021 CLINICAL DATA:  Internal fixation of the left humerus. EXAM: LEFT HUMERUS - 2+ VIEW COMPARISON:  Radiographs dated 07/27/2021. FINDINGS: Seven intraoperative fluoroscopic images provided. The total fluoroscopic time is 1 minutes 15 seconds with total air kerma of 1.89 mGy. There has been internal fixation of left humeral fracture. IMPRESSION: Intraoperative fluoroscopic images of left humeral fixation. Electronically Signed   By: Anner Crete M.D.   On: 07/27/2021 22:10   DG Humerus Left  Result Date: 07/27/2021 CLINICAL DATA:  Fall EXAM: LEFT HUMERUS - 2+ VIEW COMPARISON:  None. FINDINGS: Acute transverse fracture mid shaft of left humerus with angulation and mild displacement. Advanced degenerative changes left shoulder joint. IMPRESSION: Acute fracture left mid humerus. Electronically Signed   By: Franchot Gallo M.D.   On: 07/27/2021 12:55   DG C-Arm 1-60 Min-No Report  Result Date: 07/27/2021 Fluoroscopy was utilized by the requesting physician.  No radiographic interpretation.   DG HIP UNILAT WITH PELVIS 2-3 VIEWS LEFT  Result Date: 07/27/2021 CLINICAL DATA:  Fall EXAM: DG HIP (WITH OR WITHOUT PELVIS) 2-3V LEFT COMPARISON:  04/27/2021 FINDINGS: Right hip  replacement in satisfactory alignment. No fracture or complication. Advanced degenerative change left hip with joint space narrowing, spurring, sclerosis and collapse of the femoral head. Negative for acute fracture Calcification aorta and iliac arteries. Bilateral fallopian tube clips. IMPRESSION: Negative for acute fracture. Advanced degenerative change left hip. Electronically Signed   By: Franchot Gallo M.D.   On: 07/27/2021 12:54    Assessment/Plan: Displaced Left midshaft humerus fracture with compartment syndrome of the forearm  1 Day Post-Op s/p Procedure(s): FASCIOTOMY LEFT FOREARM EXTERNAL FIXATION LEFT HUMERUS APPLICATION OF WOUND VAC  Acute blood loss anemia: Hbg 6.0 this morning, PRBC ordered for transfusion  Weightbearing: NWB LUE Insicional and dressing care: Keep wound vac intact, please call if looses suction. Reinforce dressings as needed.  Orthopedic device(s): Humeral ex-fix VTE prophylaxis: Lovenox while inpatient Pain control: limit narcotics as much as possible, patient feeling little pain so far  Ok to eat today, plan for definitve treatment with compartment closure later this week with ortho trauma specialists.   Contact information:   Weekdays 8-5 Merlene [address]ing, [place] [phone] A fter hours and holidays please check [email or web address] for group call information for [facility]  Ventura Bruns 07/28/2021, 8:07 AM

## 2021-07-28 NOTE — Progress Notes (Signed)
Patient has low urine output. 260 mL measured from foley that was collected from the past 15 hours. Vital signs stable. Notified on call [facility]. Erling Conte, RN

## 2021-07-29 ENCOUNTER — Inpatient Hospital Stay (HOSPITAL_COMMUNITY): Payer: Medicare PPO | Admitting: Certified Registered Nurse Anesthetist

## 2021-07-29 ENCOUNTER — Inpatient Hospital Stay (HOSPITAL_COMMUNITY): Payer: Medicare PPO

## 2021-07-29 ENCOUNTER — Encounter (HOSPITAL_COMMUNITY)
Admission: EM | Disposition: A | Discharge status: Skilled Nursing Facility | Payer: Self-pay | Source: Home / Self Care | Attending: Internal Medicine

## 2021-07-29 ENCOUNTER — Encounter (HOSPITAL_COMMUNITY): Payer: Self-pay | Admitting: Orthopedic Surgery

## 2021-07-29 ENCOUNTER — Other Ambulatory Visit: Payer: Self-pay

## 2021-07-29 DIAGNOSIS — E871 Hypo-osmolality and hyponatremia: Secondary | ICD-10-CM

## 2021-07-29 DIAGNOSIS — D509 Iron deficiency anemia, unspecified: Secondary | ICD-10-CM

## 2021-07-29 HISTORY — PX: I & D EXTREMITY: SHX5045

## 2021-07-29 LAB — CBC
HCT: 28.4 % — ABNORMAL LOW (ref 36.0–46.0)
Hemoglobin: 8.9 g/dL — ABNORMAL LOW (ref 12.0–15.0)
MCH: 28.3 pg (ref 26.0–34.0)
MCHC: 31.3 g/dL (ref 30.0–36.0)
MCV: 90.2 fL (ref 80.0–100.0)
Platelets: 340 10*3/uL (ref 150–400)
RBC: 3.15 MIL/uL — ABNORMAL LOW (ref 3.87–5.11)
RDW: 23.2 % — ABNORMAL HIGH (ref 11.5–15.5)
WBC: 13 10*3/uL — ABNORMAL HIGH (ref 4.0–10.5)
nRBC: 1.6 % — ABNORMAL HIGH (ref 0.0–0.2)

## 2021-07-29 LAB — OCCULT BLOOD X 1 CARD TO LAB, STOOL: Fecal Occult Bld: POSITIVE — AB

## 2021-07-29 LAB — BASIC METABOLIC PANEL
Anion gap: 10 (ref 5–15)
BUN: 44 mg/dL — ABNORMAL HIGH (ref 8–23)
CO2: 18 mmol/L — ABNORMAL LOW (ref 22–32)
Calcium: 7.1 mg/dL — ABNORMAL LOW (ref 8.9–10.3)
Chloride: 104 mmol/L (ref 98–111)
Creatinine, Ser: 2.53 mg/dL — ABNORMAL HIGH (ref 0.44–1.00)
GFR, Estimated: 19 mL/min — ABNORMAL LOW (ref >60)
Glucose, Bld: 127 mg/dL — ABNORMAL HIGH (ref 70–99)
Potassium: 5.1 mmol/L (ref 3.5–5.1)
Sodium: 132 mmol/L — ABNORMAL LOW (ref 135–145)

## 2021-07-29 LAB — CK: Total CK: 8090 U/L — ABNORMAL HIGH (ref 38–234)

## 2021-07-29 LAB — BRAIN NATRIURETIC PEPTIDE: B Natriuretic Peptide: 198.9 pg/mL — ABNORMAL HIGH (ref 0.0–100.0)

## 2021-07-29 SURGERY — IRRIGATION AND DEBRIDEMENT EXTREMITY
Anesthesia: General | Laterality: Left

## 2021-07-29 MED ORDER — FENTANYL CITRATE (PF) 250 MCG/5ML IJ SOLN
INTRAMUSCULAR | Status: AC
  Filled 2021-07-29: qty 5

## 2021-07-29 MED ORDER — ENOXAPARIN SODIUM 40 MG/0.4ML IJ SOSY: 40.0000 mg | PREFILLED_SYRINGE | INTRAMUSCULAR | Status: DC

## 2021-07-29 MED ORDER — FUROSEMIDE 10 MG/ML IJ SOLN
20.0000 mg | Freq: Once | INTRAMUSCULAR | Status: AC
  Administered 2021-07-29: 18:00:00 20 mg via INTRAVENOUS
  Filled 2021-07-29: qty 2

## 2021-07-29 MED ORDER — ACETAMINOPHEN 10 MG/ML IV SOLN: 1000.0000 mg | Freq: Once | INTRAVENOUS | Status: DC | PRN

## 2021-07-29 MED ORDER — GLYCOPYRROLATE PF 0.2 MG/ML IJ SOSY
PREFILLED_SYRINGE | INTRAMUSCULAR | Status: AC
  Filled 2021-07-29: qty 2

## 2021-07-29 MED ORDER — FENTANYL CITRATE (PF) 100 MCG/2ML IJ SOLN: 25.0000 ug | INTRAMUSCULAR | Status: DC | PRN

## 2021-07-29 MED ORDER — PROPOFOL 10 MG/ML IV BOLUS
INTRAVENOUS | Status: AC
  Filled 2021-07-29: qty 20

## 2021-07-29 MED ORDER — ROCURONIUM BROMIDE 10 MG/ML (PF) SYRINGE
PREFILLED_SYRINGE | INTRAVENOUS | Status: DC | PRN
Start: 2021-07-29 — End: 2021-07-29
  Administered 2021-07-29: 60 mg via INTRAVENOUS

## 2021-07-29 MED ORDER — CEFAZOLIN SODIUM-DEXTROSE 2-4 GM/100ML-% IV SOLN
2.0000 g | Freq: Three times a day (TID) | INTRAVENOUS | Status: DC
  Administered 2021-07-29: 16:00:00 2 g via INTRAVENOUS
  Filled 2021-07-29: qty 100

## 2021-07-29 MED ORDER — CEFAZOLIN SODIUM-DEXTROSE 2-4 GM/100ML-% IV SOLN
2.0000 g | Freq: Two times a day (BID) | INTRAVENOUS | Status: AC
  Administered 2021-07-30: 03:00:00 2 g via INTRAVENOUS
  Filled 2021-07-29: qty 100

## 2021-07-29 MED ORDER — PHENYLEPHRINE 40 MCG/ML (10ML) SYRINGE FOR IV PUSH (FOR BLOOD PRESSURE SUPPORT)
PREFILLED_SYRINGE | INTRAVENOUS | Status: DC | PRN
  Administered 2021-07-29: 160 ug via INTRAVENOUS
  Administered 2021-07-29: 40 ug via INTRAVENOUS
  Administered 2021-07-29: 20 ug via INTRAVENOUS

## 2021-07-29 MED ORDER — PROPOFOL 10 MG/ML IV BOLUS
INTRAVENOUS | Status: DC | PRN
  Administered 2021-07-29: 70 mg via INTRAVENOUS
  Administered 2021-07-29: 10 mg via INTRAVENOUS
  Administered 2021-07-29: 30 mg via INTRAVENOUS

## 2021-07-29 MED ORDER — ONDANSETRON HCL 4 MG/2ML IJ SOLN: 4.0000 mg | Freq: Once | INTRAMUSCULAR | Status: DC | PRN

## 2021-07-29 MED ORDER — METOCLOPRAMIDE HCL 5 MG/ML IJ SOLN: 5.0000 mg | Freq: Three times a day (TID) | INTRAMUSCULAR | Status: DC | PRN

## 2021-07-29 MED ORDER — NEOSTIGMINE METHYLSULFATE 3 MG/3ML IV SOSY
PREFILLED_SYRINGE | INTRAVENOUS | Status: DC | PRN
  Administered 2021-07-29: 3 mg via INTRAVENOUS

## 2021-07-29 MED ORDER — NEOSTIGMINE METHYLSULFATE 3 MG/3ML IV SOSY
PREFILLED_SYRINGE | INTRAVENOUS | Status: AC
  Filled 2021-07-29: qty 3

## 2021-07-29 MED ORDER — FENTANYL CITRATE (PF) 250 MCG/5ML IJ SOLN
INTRAMUSCULAR | Status: DC | PRN
  Administered 2021-07-29 (×5): 25 ug via INTRAVENOUS

## 2021-07-29 MED ORDER — HEMOSTATIC AGENTS (NO CHARGE) OPTIME
TOPICAL | Status: DC | PRN
  Administered 2021-07-29: 1 via TOPICAL

## 2021-07-29 MED ORDER — LACTATED RINGERS IV SOLN: INTRAVENOUS | Status: DC

## 2021-07-29 MED ORDER — ONDANSETRON HCL 4 MG/2ML IJ SOLN
INTRAMUSCULAR | Status: DC | PRN
  Administered 2021-07-29: 4 mg via INTRAVENOUS

## 2021-07-29 MED ORDER — GLYCOPYRROLATE PF 0.2 MG/ML IJ SOSY
PREFILLED_SYRINGE | INTRAMUSCULAR | Status: DC | PRN
  Administered 2021-07-29: .4 mg via INTRAVENOUS

## 2021-07-29 MED ORDER — PHENYLEPHRINE HCL-NACL 20-0.9 MG/250ML-% IV SOLN
INTRAVENOUS | Status: DC | PRN
Start: 2021-07-29 — End: 2021-07-29
  Administered 2021-07-29: 25 ug/min via INTRAVENOUS

## 2021-07-29 MED ORDER — PHENYLEPHRINE 40 MCG/ML (10ML) SYRINGE FOR IV PUSH (FOR BLOOD PRESSURE SUPPORT)
PREFILLED_SYRINGE | INTRAVENOUS | Status: AC
  Filled 2021-07-29: qty 10

## 2021-07-29 MED ORDER — CHLORHEXIDINE GLUCONATE 0.12 % MT SOLN
OROMUCOSAL | Status: AC
  Administered 2021-07-29: 08:00:00 15 mL
  Filled 2021-07-29: qty 15

## 2021-07-29 MED ORDER — 0.9 % SODIUM CHLORIDE (POUR BTL) OPTIME
TOPICAL | Status: DC | PRN
  Administered 2021-07-29: 09:00:00 1000 mL

## 2021-07-29 MED ORDER — SODIUM ZIRCONIUM CYCLOSILICATE 10 G PO PACK
10.0000 g | PACK | Freq: Once | ORAL | Status: DC
  Filled 2021-07-29: qty 1

## 2021-07-29 MED ORDER — LIDOCAINE 2% (20 MG/ML) 5 ML SYRINGE
INTRAMUSCULAR | Status: AC
  Filled 2021-07-29: qty 5

## 2021-07-29 MED ORDER — METOCLOPRAMIDE HCL 5 MG PO TABS: 5.0000 mg | ORAL_TABLET | Freq: Three times a day (TID) | ORAL | Status: DC | PRN

## 2021-07-29 MED ORDER — ROCURONIUM BROMIDE 10 MG/ML (PF) SYRINGE
PREFILLED_SYRINGE | INTRAVENOUS | Status: AC
  Filled 2021-07-29: qty 10

## 2021-07-29 MED ORDER — ENOXAPARIN SODIUM 40 MG/0.4ML IJ SOSY
30.0000 mg | PREFILLED_SYRINGE | INTRAMUSCULAR | Status: DC
  Administered 2021-07-30 – 2021-08-01 (×3): 30 mg via SUBCUTANEOUS
  Filled 2021-07-29 (×3): qty 0.4

## 2021-07-29 MED ORDER — LIDOCAINE 2% (20 MG/ML) 5 ML SYRINGE
INTRAMUSCULAR | Status: DC | PRN
  Administered 2021-07-29: 40 mg via INTRAVENOUS
  Administered 2021-07-29: 60 mg via INTRAVENOUS

## 2021-07-29 SURGICAL SUPPLY — 65 items
BAG COUNTER SPONGE SURGICOUNT (BAG) ×2 IMPLANT
BAG SPNG CNTER NS LX DISP (BAG) ×1
BLADE CLIPPER SURG (BLADE) ×2 IMPLANT
BNDG CMPR 9X4 STRL LF SNTH (GAUZE/BANDAGES/DRESSINGS)
BNDG COHESIVE 4X5 TAN STRL (GAUZE/BANDAGES/DRESSINGS) ×2 IMPLANT
BNDG ELASTIC 3X5.8 VLCR STR LF (GAUZE/BANDAGES/DRESSINGS) ×1 IMPLANT
BNDG ELASTIC 4X5.8 VLCR STR LF (GAUZE/BANDAGES/DRESSINGS) ×2 IMPLANT
BNDG ESMARK 4X9 LF (GAUZE/BANDAGES/DRESSINGS) ×1 IMPLANT
BNDG GAUZE ELAST 4 BULKY (GAUZE/BANDAGES/DRESSINGS) ×4 IMPLANT
BRUSH SCRUB EZ PLAIN DRY (MISCELLANEOUS) ×4 IMPLANT
CANISTER WOUNDNEG PRESSURE 500 (CANNISTER) ×1 IMPLANT
CORD BIPOLAR FORCEPS 12FT (ELECTRODE) ×1 IMPLANT
COVER SURGICAL LIGHT HANDLE (MISCELLANEOUS) ×4 IMPLANT
DECANTER SPIKE VIAL GLASS SM (MISCELLANEOUS) IMPLANT
DRAPE C-ARM 42X72 X-RAY (DRAPES) IMPLANT
DRAPE DERMATAC (DRAPES) ×2 IMPLANT
DRAPE U-SHAPE 47X51 STRL (DRAPES) ×2 IMPLANT
DRSG ADAPTIC 3X8 NADH LF (GAUZE/BANDAGES/DRESSINGS) ×2 IMPLANT
DRSG EMULSION OIL 3X3 NADH (GAUZE/BANDAGES/DRESSINGS) IMPLANT
DRSG MEPITEL 8X12 (GAUZE/BANDAGES/DRESSINGS) ×1 IMPLANT
DRSG PAD ABDOMINAL 8X10 ST (GAUZE/BANDAGES/DRESSINGS) ×1 IMPLANT
DRSG VAC ATS MED SENSATRAC (GAUZE/BANDAGES/DRESSINGS) ×1 IMPLANT
ELECT REM PT RETURN 9FT ADLT (ELECTROSURGICAL) ×2
ELECTRODE REM PT RTRN 9FT ADLT (ELECTROSURGICAL) ×1 IMPLANT
GAUZE SPONGE 4X4 12PLY STRL (GAUZE/BANDAGES/DRESSINGS) ×2 IMPLANT
GLOVE SRG 8 PF TXTR STRL LF DI (GLOVE) ×1 IMPLANT
GLOVE SURG ENC MOIS LTX SZ8 (GLOVE) ×2 IMPLANT
GLOVE SURG ORTHO LTX SZ7.5 (GLOVE) ×4 IMPLANT
GLOVE SURG UNDER POLY LF SZ7.5 (GLOVE) ×2 IMPLANT
GLOVE SURG UNDER POLY LF SZ8 (GLOVE) ×2
GLOVE SURG UNDER POLY LF SZ9 (GLOVE) ×2 IMPLANT
GOWN STRL REUS W/ TWL LRG LVL3 (GOWN DISPOSABLE) ×2 IMPLANT
GOWN STRL REUS W/ TWL XL LVL3 (GOWN DISPOSABLE) ×1 IMPLANT
GOWN STRL REUS W/TWL LRG LVL3 (GOWN DISPOSABLE) ×4
GOWN STRL REUS W/TWL XL LVL3 (GOWN DISPOSABLE) ×2
HANDPIECE INTERPULSE COAX TIP (DISPOSABLE)
HEMOSTAT ARISTA ABSORB 3G PWDR (HEMOSTASIS) ×1 IMPLANT
KIT BASIN OR (CUSTOM PROCEDURE TRAY) ×2 IMPLANT
KIT TURNOVER KIT B (KITS) ×2 IMPLANT
MANIFOLD NEPTUNE II (INSTRUMENTS) ×2 IMPLANT
NDL HYPO 25GX1X1/2 BEV (NEEDLE) IMPLANT
NEEDLE HYPO 25GX1X1/2 BEV (NEEDLE) IMPLANT
NS IRRIG 1000ML POUR BTL (IV SOLUTION) ×2 IMPLANT
PACK ORTHO EXTREMITY (CUSTOM PROCEDURE TRAY) ×2 IMPLANT
PAD ARMBOARD 7.5X6 YLW CONV (MISCELLANEOUS) ×4 IMPLANT
PAD CAST 3X4 CTTN HI CHSV (CAST SUPPLIES) ×1 IMPLANT
PADDING CAST COTTON 3X4 STRL (CAST SUPPLIES) ×2
PADDING CAST COTTON 6X4 STRL (CAST SUPPLIES) ×2 IMPLANT
SET HNDPC FAN SPRY TIP SCT (DISPOSABLE) IMPLANT
SOL PREP POV-IOD 4OZ 10% (MISCELLANEOUS) ×2 IMPLANT
SOL SCRUB PVP POV-IOD 4OZ 7.5% (MISCELLANEOUS) ×2
SOLUTION SCRB POV-IOD 4OZ 7.5% (MISCELLANEOUS) ×1 IMPLANT
SPONGE T-LAP 18X18 ~~LOC~~+RFID (SPONGE) ×5 IMPLANT
STOCKINETTE IMPERVIOUS 9X36 MD (GAUZE/BANDAGES/DRESSINGS) IMPLANT
SUT ETHILON 2 0 PSLX (SUTURE) ×4 IMPLANT
SUT PDS AB 2-0 CT1 27 (SUTURE) ×2 IMPLANT
SUT VIC AB 2-0 CT1 27 (SUTURE) ×2
SUT VIC AB 2-0 CT1 TAPERPNT 27 (SUTURE) ×1 IMPLANT
SYR CONTROL 10ML LL (SYRINGE) IMPLANT
TOWEL GREEN STERILE (TOWEL DISPOSABLE) ×4 IMPLANT
TOWEL GREEN STERILE FF (TOWEL DISPOSABLE) ×2 IMPLANT
TUBE CONNECTING 12X1/4 (SUCTIONS) ×2 IMPLANT
UNDERPAD 30X36 HEAVY ABSORB (UNDERPADS AND DIAPERS) ×2 IMPLANT
WATER STERILE IRR 1000ML POUR (IV SOLUTION) ×2 IMPLANT
YANKAUER SUCT BULB TIP NO VENT (SUCTIONS) ×2 IMPLANT

## 2021-07-29 NOTE — Progress Notes (Signed)
Called and gave report to Rachael Darby, CRNA. Erling Conte, RN

## 2021-07-29 NOTE — NC FL2 (Signed)
[place] LEVEL OF CARE SCREENING TOOL     IDENTIFICATION  Patient Name: [name] Birthdate: 1940/10/28 Sex: female Admission Date (Current Location): 07/27/2021  [facility] and [place] Number:  Herbalist and Address:  [facility], [place] [address], [place], [place] [address]      Provider Number: [id number]  Attending Physician Name and Address:  Nolberto Hanlon, MD  Relative Name and Phone Number:  Donabelle, [place]   [phone]    Current Level of Care: Hospital Recommended Level of Care: [place] Prior Approval Number:    Date Approved/Denied:   PASRR Number: [id number]  Discharge Plan: SNF    Current Diagnoses: Patient Active Problem List   Diagnosis Date Noted   Left humeral fracture 07/27/2021   Leukocytosis 07/27/2021   Anemia 07/27/2021   Thoracic ascending aortic aneurysm 07/27/2021   Hyperlipidemia 07/27/2021   Alzheimer disease ([place]) 07/09/2021   Vascular dementia ([place]) 07/09/2021   Fall    Elevated CK 04/28/2021   Traumatic rhabdomyolysis ([place])    GERD (gastroesophageal reflux disease) 08/19/2020   Hypokalemia 08/19/2020   Urinary frequency 08/19/2020   Weakness 08/19/2020   Dyspnea 08/19/2020   Troponin I above reference range 08/19/2020   Skin cancer 06/09/2017   Essential hypertension 08/26/2015    Orientation RESPIRATION BLADDER Height & Weight     Self, Time, Situation, Place  O2 Incontinent, Indwelling catheter Weight: 176 lb 5.9 oz (80 kg) Height:  5' (152.4 cm)  BEHAVIORAL SYMPTOMS/MOOD NEUROLOGICAL BOWEL NUTRITION STATUS      Incontinent Diet (see discharge summary)  AMBULATORY STATUS COMMUNICATION OF NEEDS Skin   Total Care Verbally Surgical wounds                       Personal Care Assistance Level of Assistance  Bathing, Feeding, Dressing Bathing Assistance: Maximum assistance Feeding assistance: Limited assistance Dressing Assistance: Maximum  assistance     Functional Limitations Info  Sight, Hearing, Speech Sight Info: Adequate Hearing Info: Adequate Speech Info: Adequate    SPECIAL CARE FACTORS FREQUENCY  PT (By licensed PT), OT (By licensed OT)     PT Frequency: 5x week OT Frequency: 5x week            Contractures Contractures Info: Not present    Additional Factors Info  Code Status, Allergies Code Status Info: full Allergies Info: Molds & Smuts, Other, Statins           Current Medications (07/29/2021):  This is the current hospital active medication list Current Facility-Administered Medications  Medication Dose Route Frequency Provider Last Rate Last Admin   0.9 %  sodium chloride infusion (Manually program via Guardrails IV Fluids)   Intravenous Once Ainsley Spinner, PA-C       0.9 %  sodium chloride infusion   Intravenous Continuous Ainsley Spinner, PA-C 125 mL/hr at 07/29/21 0600 Infusion Verify at 07/29/21 0600   acetaminophen (TYLENOL) tablet 325-650 mg  325-650 mg Oral Q6H PRN Ainsley Spinner, PA-C       bisacodyl (DULCOLAX) suppository 10 mg  10 mg Rectal Daily PRN Ainsley Spinner, PA-C       ceFAZolin (ANCEF) IVPB 2g/100 mL premix  2 g Intravenous Q8H Ainsley Spinner, PA-C       Chlorhexidine Gluconate Cloth 2 % PADS 6 each  6 each Topical Daily Ainsley Spinner, PA-C   6 each at 07/28/21 1313   diphenhydrAMINE (BENADRYL) 12.5 MG/5ML elixir 12.5-25 mg  12.5-25  mg Oral Q4H PRN Ainsley Spinner, PA-C       docusate sodium (COLACE) capsule 100 mg  100 mg Oral BID Ainsley Spinner, PA-C   100 mg at 07/28/21 0053   HYDROcodone-acetaminophen (NORCO/VICODIN) 5-325 MG per tablet 1-2 tablet  1-2 tablet Oral Q4H PRN Ainsley Spinner, PA-C   1 tablet at 07/28/21 2322   MEDLINE mouth rinse  15 mL Mouth Rinse BID Ainsley Spinner, PA-C   15 mL at 07/28/21 2000   metoCLOPramide (REGLAN) tablet 5-10 mg  5-10 mg Oral Q8H PRN Ainsley Spinner, PA-C       Or   metoCLOPramide (REGLAN) injection 5-10 mg  5-10 mg Intravenous Q8H PRN Ainsley Spinner, PA-C        morphine 2 MG/ML injection 0.5-1 mg  0.5-1 mg Intravenous Q2H PRN Ainsley Spinner, PA-C       ondansetron [facility]) tablet 4 mg  4 mg Oral Q6H PRN Ainsley Spinner, PA-C       Or   ondansetron [facility]) injection 4 mg  4 mg Intravenous Q6H PRN Ainsley Spinner, PA-C       polyethylene glycol (MIRALAX / GLYCOLAX) packet 17 g  17 g Oral Daily PRN Ainsley Spinner, PA-C       senna (SENOKOT) tablet 8.6 mg  1 tablet Oral BID Ainsley Spinner, PA-C   8.6 mg at 07/28/21 0053   sodium chloride 0.9 % bolus 500 mL  500 mL Intravenous Once Ainsley Spinner, PA-C         Discharge Medications: Please see discharge summary for a list of discharge medications.  Relevant Imaging Results:  Relevant Lab Results:   Additional Information SS#[id number]. Pt is vaccinated for covid with one booster.  Joanne Chars, LCSW

## 2021-07-29 NOTE — Progress Notes (Signed)
° ° ° °  Subjective:  Patient reports pain as well controlled. Son was here earlier but not currently at bedside. Patient went to the OR today with Dr. Marcelino Scot.  Objective:   VITALS:   Vitals:   07/29/21 1204 07/29/21 1219 07/29/21 1233 07/29/21 1302  BP: (!) 160/63 (!) 161/61 (!) 161/66 133/68  Pulse: 81 83 82 83  Resp: 19 14 17 18   Temp:   97.8 F (36.6 C) (!) 97.5 F (36.4 C)  TempSrc:    Oral  SpO2: 97% 93% 93% 92%  Weight:      Height:       Ex-Fix pin sites dressings clean dry and intact.  Upper arm and forearm compartments are soft.  Wound VAC dressing holding suction with three quarters full canister of serosanguineous output. Fingers warm well-perfused with brisk cap refill. Patient unable to demonstrate any motor or sensory function in the hand.    Lab Results  Component Value Date   WBC 13.0 (H) 07/29/2021   HGB 8.9 (L) 07/29/2021   HCT 28.4 (L) 07/29/2021   MCV 90.2 07/29/2021   PLT 340 07/29/2021   BMET    Component Value Date/Time   NA 132 (L) 07/29/2021 0306   NA 143 06/07/2020 1630   K 5.1 07/29/2021 0306   CL 104 07/29/2021 0306   CO2 18 (L) 07/29/2021 0306   GLUCOSE 127 (H) 07/29/2021 0306   BUN 44 (H) 07/29/2021 0306   BUN 25 06/07/2020 1630   CREATININE 2.53 (H) 07/29/2021 0306   CALCIUM 7.1 (L) 07/29/2021 0306   GFRNONAA 19 (L) 07/29/2021 0306    Assessment/Plan: Day of Surgery   Principal Problem:   Left humeral fracture Active Problems:   Essential hypertension   Traumatic rhabdomyolysis (HCC)   Alzheimer disease (HCC)   Leukocytosis   Anemia   Thoracic ascending aortic aneurysm   Hyperlipidemia   Status post left forearm fasciotomies for compartment syndrome, left humerus fracture closed reduction application of external fixator on 1/22, return to OR 1/24 with Dr. Marcelino Scot for left forearm necrotic muscle debridement and wound closure   Post op recs: WB: Nonweightbearing left upper extremity Abx: ancef x23 hours post op Dressing:  Wound VAC forearm 125 mmHg continuous DVT prophylaxis: lovenox starting POD1 x4 weeks Disposition: TBD, plan for return to OR for ex-fix removal and ORIF humerus fx Address: [place], [place], [place] [address]  Office Phone: [phone]   Post op recs: WB: Nonweightbearing left upper extremity Abx: ancef x23 hours post op Imaging: PACU xrays Dressing: Wound VAC forearm 125 mmHg continuous DVT prophylaxis: lovenox starting POD1 x4 weeks Disposition: Plan to return to the OR later this week for repeat debridement of the forearm with repeat application of wound VAC or wound closure. Address: [address] [place], [place],  [address]  Office Phone: [phone]    Willaim Sheng 07/29/2021, 4:54 PM   Charlies Constable, MD  Contact information:   [phone] 7am-5pm epic message Dr. Zachery Dakins, or call office for patient follow up: [phone] After hours and holidays please check [email or web address] for group call information for [facility]

## 2021-07-29 NOTE — Progress Notes (Signed)
PROGRESS NOTE    [name]  QMG:[id number] DOB: 1940-08-26 DOA: 07/27/2021 PCP: Orpah Melter, MD    Brief Narrative:   [name] is a 81 y.o. female with medical history significant of HTN, alzheimers dementia, anemia,  anxiety and depression who presents to ED after fall with subsequent left arm trauma. History is from her son due to her dementia.  She has a dresser to the right of the bed that is close enough to the bed that she got her arm stuck in the drawer and her body slipped down. They found her on the floor and her left arm stuck in drawer. She fell sometime between 8:15am and 10:30am. Ems was already on site at 10:30 when they called her son.    With ems given fentanyl and straightened out humerus before transport.    Elbow xray: negative, humerus left xray: acute fx, left mid humerus,  Hip xray: negative for acute fx.   1/23 status post surgery on 07/27/2021.  Hemoglobin 6.0, getting 2 units of packed red blood cell transfusion this a.m. 1/24 s/p ORIF for repeat evaluation of left forearm status post fasciotomies possible debridement of nonviable necrotic muscle, possible wound closure today by orthopedics.  FOB positive, creatinine up   Consultants:  Orthopedics  Procedures:   Antimicrobials:      Subjective: S/p surgery , I saw pt after surgery, still sleepy, but responds to my questions. Denies sob, cp. Son at bedside.   Objective: Vitals:   07/28/21 2016 07/29/21 0004 07/29/21 0547 07/29/21 0754  BP: (!) 143/66  (!) 117/44 (!) 150/62  Pulse: 91  90 89  Resp: 14  20 16   Temp: 98.4 F (36.9 C)  97.8 F (36.6 C) 97.7 F (36.5 C)  TempSrc: Oral  Oral Oral  SpO2: 98%  99% 100%  Weight:  80 kg  80 kg  Height:  5' (1.524 m)  5' (1.524 m)    Intake/Output Summary (Last 24 hours) at 07/29/2021 0845 Last data filed at 07/29/2021 0600 Gross per 24 hour  Intake 1874.85 ml  Output 937 ml  Net 937.85 ml   Filed Weights   07/29/21 0004 07/29/21 0754   Weight: 80 kg 80 kg    Examination: Nad, calm Anteriorly cta with poor resp effort Reg s1/s2 no gallop Soft benign +bs Mild edema b/l. Left shoulder stabilizer metal in place Mood and affect appropriate in current setting.  Awakens but sleepy      Data Reviewed: I have personally reviewed following labs and imaging studies  CBC: Recent Labs  Lab 07/27/21 1237 07/28/21 0049 07/28/21 1920 07/29/21 0306  WBC 20.5* 12.3* 13.9* 13.0*  NEUTROABS 18.0*  --   --   --   HGB 7.6* 6.0* 9.7* 8.9*  HCT 24.4* 20.5* 30.6* 28.4*  MCV 100.8* 103.5* 90.8 90.2  PLT 523* 354 351 326   Basic Metabolic Panel: Recent Labs  Lab 07/27/21 1237 07/28/21 0049  NA 134* 136  K 4.6 5.0  CL 106 110  CO2 20* 19*  GLUCOSE 155* 146*  BUN 30* 29*  CREATININE 1.10* 1.14*  CALCIUM 8.2* 7.2*  MG  --  2.2  PHOS  --  5.9*   GFR: Estimated Creatinine Clearance: 36.8 mL/min (A) (by C-G formula based on SCr of 1.14 mg/dL (H)). Liver Function Tests: No results for input(s): AST, ALT, ALKPHOS, BILITOT, PROT, ALBUMIN in the last 168 hours. No results for input(s): LIPASE, AMYLASE in the last 168 hours. No results for input(s):  AMMONIA in the last 168 hours. Coagulation Profile: No results for input(s): INR, PROTIME in the last 168 hours. Cardiac Enzymes: Recent Labs  Lab 07/27/21 1237 07/28/21 0049 07/29/21 0306  CKTOTAL 23,294* 39,443* 8,090*   BNP (last 3 results) No results for input(s): PROBNP in the last 8760 hours. HbA1C: No results for input(s): HGBA1C in the last 72 hours. CBG: No results for input(s): GLUCAP in the last 168 hours. Lipid Profile: No results for input(s): CHOL, HDL, LDLCALC, TRIG, CHOLHDL, LDLDIRECT in the last 72 hours. Thyroid Function Tests: No results for input(s): TSH, T4TOTAL, FREET4, T3FREE, THYROIDAB in the last 72 hours. Anemia Panel: Recent Labs    07/28/21 0049  VITAMINB12 391  FOLATE 19.1  FERRITIN 48  TIBC 246*  IRON 13*   Sepsis Labs: No  results for input(s): PROCALCITON, LATICACIDVEN in the last 168 hours.  Recent Results (from the past 240 hour(s))  Resp Panel by RT-PCR (Flu A&B, Covid) Nasopharyngeal Swab     Status: None   Collection Time: 07/27/21 11:49 AM   Specimen: Nasopharyngeal Swab; Nasopharyngeal(NP) swabs in vial transport medium  Result Value Ref Range Status   SARS Coronavirus 2 by RT PCR NEGATIVE NEGATIVE Final    Comment: (NOTE) SARS-CoV-2 target nucleic acids are NOT DETECTED.  The SARS-CoV-2 RNA is generally detectable in upper respiratory specimens during the acute phase of infection. The lowest concentration of SARS-CoV-2 viral copies this assay can detect is 138 copies/mL. A negative result does not preclude SARS-Cov-2 infection and should not be used as the sole basis for treatment or other patient management decisions. A negative result may occur with  improper specimen collection/handling, submission of specimen other than nasopharyngeal swab, presence of viral mutation(s) within the areas targeted by this assay, and inadequate number of viral copies(<138 copies/mL). A negative result must be combined with clinical observations, patient history, and epidemiological information. The expected result is Negative.  Fact Sheet for Patients:  EntrepreneurPulse.com.au  Fact Sheet for Healthcare Providers:  IncredibleEmployment.be  This test is no t yet approved or cleared by the [place] FDA and  has been authorized for detection and/or diagnosis of SARS-CoV-2 by FDA under an Emergency Use Authorization (EUA). This EUA will remain  in effect (meaning this test can be used) for the duration of the COVID-19 declaration under Section 564(b)(1) of the Act, 21 U.S.C.section 360bbb-3(b)(1), unless the authorization is terminated  or revoked sooner.       Influenza A by PCR NEGATIVE NEGATIVE Final   Influenza B by PCR NEGATIVE NEGATIVE Final    Comment:  (NOTE) The Xpert Xpress SARS-CoV-2/FLU/RSV plus assay is intended as an aid in the diagnosis of influenza from Nasopharyngeal swab specimens and should not be used as a sole basis for treatment. Nasal washings and aspirates are unacceptable for Xpert Xpress SARS-CoV-2/FLU/RSV testing.  Fact Sheet for Patients: EntrepreneurPulse.com.au  Fact Sheet for Healthcare Providers: IncredibleEmployment.be  This test is not yet approved or cleared by the [place] FDA and has been authorized for detection and/or diagnosis of SARS-CoV-2 by FDA under an Emergency Use Authorization (EUA). This EUA will remain in effect (meaning this test can be used) for the duration of the COVID-19 declaration under Section 564(b)(1) of the Act, 21 U.S.C. section 360bbb-3(b)(1), unless the authorization is terminated or revoked.  Performed at [facility], [place] [address] [place], [place] [address]          Radiology Studies: DG Chest 1 View  Result Date: 07/27/2021 CLINICAL  DATA:  Unwitnessed fall EXAM: CHEST  1 VIEW COMPARISON:  04/27/2021 FINDINGS: Heart size and vascularity normal. Lungs clear without infiltrate or effusion. Moderate to advanced degenerative change in both shoulders. IMPRESSION: No active disease. Electronically Signed   By: Franchot Gallo M.D.   On: 07/27/2021 12:56   DG Elbow Complete Left  Result Date: 07/27/2021 CLINICAL DATA:  Fall.  Deformity EXAM: LEFT ELBOW - COMPLETE 3+ VIEW COMPARISON:  None. FINDINGS: There is no evidence of fracture, dislocation, or joint effusion. There is no evidence of arthropathy or other focal bone abnormality. Soft tissues are unremarkable. IMPRESSION: Negative. Electronically Signed   By: Franchot Gallo M.D.   On: 07/27/2021 12:52   DG Forearm Left  Result Date: 07/27/2021 CLINICAL DATA:  Fall. EXAM: LEFT FOREARM - 2 VIEW COMPARISON:  None. FINDINGS: There is no evidence of fracture or other focal bone  lesions. Soft tissues are unremarkable. IMPRESSION: Negative. Electronically Signed   By: Franchot Gallo M.D.   On: 07/27/2021 12:53   DG Humerus Left  Result Date: 07/27/2021 CLINICAL DATA:  Left humeral fracture, postoperative examination EXAM: LEFT HUMERUS - 2+ VIEW COMPARISON:  None. FINDINGS: Acute transverse fracture of the a mid diaphysis of the left humerus is again identified. External fixator has been applied. There is 1/2 shaft with posterolateral displacement of the distal fracture fragment and distraction of the fracture fragments by approximately 3-4 mm. Normal alignment. IMPRESSION: Interval external fixation of mid diaphyseal left humeral fracture as described above. Electronically Signed   By: Fidela Salisbury M.D.   On: 07/27/2021 23:39   DG Humerus Left  Result Date: 07/27/2021 CLINICAL DATA:  Internal fixation of the left humerus. EXAM: LEFT HUMERUS - 2+ VIEW COMPARISON:  Radiographs dated 07/27/2021. FINDINGS: Seven intraoperative fluoroscopic images provided. The total fluoroscopic time is 1 minutes 15 seconds with total air kerma of 1.89 mGy. There has been internal fixation of left humeral fracture. IMPRESSION: Intraoperative fluoroscopic images of left humeral fixation. Electronically Signed   By: Anner Crete M.D.   On: 07/27/2021 22:10   DG Humerus Left  Result Date: 07/27/2021 CLINICAL DATA:  Fall EXAM: LEFT HUMERUS - 2+ VIEW COMPARISON:  None. FINDINGS: Acute transverse fracture mid shaft of left humerus with angulation and mild displacement. Advanced degenerative changes left shoulder joint. IMPRESSION: Acute fracture left mid humerus. Electronically Signed   By: Franchot Gallo M.D.   On: 07/27/2021 12:55   DG C-Arm 1-60 Min-No Report  Result Date: 07/27/2021 Fluoroscopy was utilized by the requesting physician.  No radiographic interpretation.   DG HIP UNILAT WITH PELVIS 2-3 VIEWS LEFT  Result Date: 07/27/2021 CLINICAL DATA:  Fall EXAM: DG HIP (WITH OR WITHOUT  PELVIS) 2-3V LEFT COMPARISON:  04/27/2021 FINDINGS: Right hip replacement in satisfactory alignment. No fracture or complication. Advanced degenerative change left hip with joint space narrowing, spurring, sclerosis and collapse of the femoral head. Negative for acute fracture Calcification aorta and iliac arteries. Bilateral fallopian tube clips. IMPRESSION: Negative for acute fracture. Advanced degenerative change left hip. Electronically Signed   By: Franchot Gallo M.D.   On: 07/27/2021 12:54        Scheduled Meds:  [RCV Hold] sodium chloride   Intravenous Once   [MAR Hold] Chlorhexidine Gluconate Cloth  6 each Topical Daily   [MAR Hold] docusate sodium  100 mg Oral BID   [MAR Hold] mouth rinse  15 mL Mouth Rinse BID   [MAR Hold] senna  1 tablet Oral BID   Continuous Infusions:  sodium chloride 125 mL/hr at 07/29/21 0600   [MAR Hold]  ceFAZolin (ANCEF) IV     lactated ringers 10 mL/hr at 07/29/21 0804   [MAR Hold] sodium chloride      Assessment & Plan:   Principal Problem:   Left humeral fracture Active Problems:   Essential hypertension   Traumatic rhabdomyolysis (HCC)   Alzheimer disease (HCC)   Leukocytosis   Anemia   Thoracic ascending aortic aneurysm   Hyperlipidemia   * Left humeral fracture- (present on admission) S/p fasciotomy Lt forearm, external fixation Lt humerus, wound vac application on 2/13 0/86 s/p OR for compartment closure today Lovenox held due to low hg yesterday, may consider resuming and monitor hg  for dvt ppx. Scd  Mx per ortho     Traumatic rhabdomyolysis ([place])- (present on admission) Ck trending down now Will decrease IV fluids rate as she appears little volume overload and received 2 prbc yesterday too.    Acute blood loss anemia due to surgery Chronic anemia Hg 6.0, s/p 2 units prbc 1/23, now Hg 8.9 Iron panel with low iron.  Fob + Transfuse for hemoglobin less than 7 Spoke to son unsure if patient has had colonoscopy in the  past.  After much discussion he has opted to hold off on calling GI unless hemoglobin continues to drop. She has received 2 units packed red blood cells and IV fluids, mildly volume overloaded.  Discussed possible iron transfusion in a.m. We will start her on ferrous sulfate      AKI Prerenal and rhabdo.  1/24 little vol. Overloaded too, cr doubled. Continue ivf Give lasix 20mg  iv x1 Ck renal US If continues to increase , consider renal consult Monitor K. Will give dose of lokelma today as k on upper nml level to prevent it from increasing    Leukocytosis- (present on admission) Likely stress-induced/reactive Improving To monitor      Essential hypertension- (present on admission) BP stable continue to monitor   Alzheimer disease ([place])- (present on admission) At baseline, delirium precautions    Thoracic ascending aortic aneurysm- (present on admission) Noted and follows outpatient.    Hyperlipidemia Holding statin due to rhabdomyolysis    hyponatremia Mild , 132.  May be due to volume overload since receiving IV fluids and packed red blood cells Will give IV Lasix 20 mg x 1 as above Monitor levels   DVT prophylaxis: scd, hold lovenox due to acute blood loss Code Status:full Family Communication: son at bedside Disposition Plan:  Status is: Inpatient  Remains inpatient appropriate because: IV treatment.  Renal function worsening.  Went to the OR today.  Need to monitor labs.            LOS: 2 days   Time spent: 45 minutes with more than 50% on [place], MD [facility] Pager [phone]  If 7PM-7AM, please contact night-coverage 07/29/2021, 8:45 AM

## 2021-07-29 NOTE — Progress Notes (Signed)
Called and notified [facility] patient has a positive hemoccult and black stool. Erling Conte, RN

## 2021-07-29 NOTE — Transfer of Care (Signed)
Immediate Anesthesia Transfer of Care Note  Patient: [name]  Procedure(s) Performed: IRRIGATION AND DEBRIDEMENT EXTREMITY (Left)  Patient Location: PACU  Anesthesia Type:General  Level of Consciousness: patient cooperative and responds to stimulation  Airway & Oxygen Therapy: Patient Spontanous Breathing and Patient connected to nasal cannula oxygen  Post-op Assessment: Report given to RN and Post -op Vital signs reviewed and stable  Post vital signs: Reviewed and stable  Last Vitals:  Vitals Value Taken Time  BP 164/62 07/29/21 1149  Temp    Pulse 83 07/29/21 1154  Resp 14 07/29/21 1154  SpO2 96 % 07/29/21 1154  Vitals shown include unvalidated device data.  Last Pain:  Vitals:   07/29/21 0754  TempSrc: Oral  PainSc: 0-No pain      Patients Stated Pain Goal: 3 (04/59/13 6859)  Complications: No notable events documented.

## 2021-07-29 NOTE — Anesthesia Procedure Notes (Signed)
Procedure Name: Intubation Date/Time: 07/29/2021 8:35 AM Performed by: Betha Loa, CRNA Pre-anesthesia Checklist: Patient identified, Emergency Drugs available, Suction available and Patient being monitored Patient Re-evaluated:Patient Re-evaluated prior to induction Oxygen Delivery Method: Circle System Utilized Preoxygenation: Pre-oxygenation with 100% oxygen Induction Type: IV induction Ventilation: Mask ventilation without difficulty and Oral airway inserted - appropriate to patient size Laryngoscope Size: Mac and 3 Grade View: Grade I Tube type: Oral Tube size: 7.0 mm Number of attempts: 1 Airway Equipment and Method: Stylet and Oral airway Placement Confirmation: ETT inserted through vocal cords under direct vision, positive ETCO2 and breath sounds checked- equal and bilateral Secured at: 21 cm Tube secured with: Tape Dental Injury: Teeth and Oropharynx as per pre-operative assessment

## 2021-07-29 NOTE — Anesthesia Preprocedure Evaluation (Addendum)
Anesthesia Evaluation  Patient identified by MRN, date of birth, ID band Patient awake    Reviewed: Allergy & Precautions, NPO status , Patient's Chart, lab work & pertinent test results  Airway Mallampati: II       Dental  (+) Poor Dentition, Edentulous Upper, Partial Lower   Pulmonary    Pulmonary exam normal        Cardiovascular hypertension, Pt. on medications and Pt. on home beta blockers Normal cardiovascular exam     Neuro/Psych    GI/Hepatic   Endo/Other  negative endocrine ROS  Renal/GU Renal InsufficiencyRenal disease  negative genitourinary   Musculoskeletal   Abdominal (+) + obese,   Peds  Hematology  (+) anemia ,   Anesthesia Other Findings   Reproductive/Obstetrics                            Anesthesia Physical Anesthesia Plan  ASA: 2  Anesthesia Plan: General   Post-op Pain Management: Dilaudid IV   Induction: Intravenous  PONV Risk Score and Plan: 4 or greater and Ondansetron and Dexamethasone  Airway Management Planned: LMA  Additional Equipment: None  Intra-op Plan:   Post-operative Plan: Extubation in OR  Informed Consent: I have reviewed the patients History and Physical, chart, labs and discussed the procedure including the risks, benefits and alternatives for the proposed anesthesia with the patient or authorized representative who has indicated his/her understanding and acceptance.     Dental advisory given  Plan Discussed with: CRNA  Anesthesia Plan Comments:         Anesthesia Quick Evaluation

## 2021-07-29 NOTE — Progress Notes (Signed)
Confirmed surgical plan with son this am by phone. He has provided consent to proceed.  Altamese [place], MD [facility], [facility] [phone]

## 2021-07-29 NOTE — Progress Notes (Signed)
PHARMACY NOTE:  ANTIMICROBIAL RENAL DOSAGE ADJUSTMENT  Current antimicrobial regimen includes a mismatch between antimicrobial dosage and estimated renal function.  As per policy approved by the Pharmacy & Therapeutics and Medical Executive Committees, the antimicrobial dosage will be adjusted accordingly.  Current antimicrobial dosage:  Cefazolin 2 gm IV Q 8 hrs X 3 post-op doses  Indication: Surgical prophylaxis  Renal Function:  Estimated Creatinine Clearance: 16.6 mL/min (A) (by C-G formula based on SCr of 2.53 mg/dL (H)). []      On intermittent HD, scheduled: []      On CRRT    Antimicrobial dosage has been changed to:  Cefazolin 2 gm IV Q 12 hrs X 2 post-op doses   Thank you for allowing pharmacy to be a part of this patient's care.  Gillermina Hu, PharmD, BCPS, [facility] Clinical Pharmacist 07/29/2021 5:17 PM
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
## 2021-07-30 ENCOUNTER — Encounter (HOSPITAL_COMMUNITY): Payer: Self-pay | Admitting: Orthopedic Surgery

## 2021-07-30 DIAGNOSIS — S42202P Unspecified fracture of upper end of left humerus, subsequent encounter for fracture with malunion: Secondary | ICD-10-CM

## 2021-07-30 DIAGNOSIS — T796XXD Traumatic ischemia of muscle, subsequent encounter: Secondary | ICD-10-CM

## 2021-07-30 DIAGNOSIS — I10 Essential (primary) hypertension: Secondary | ICD-10-CM

## 2021-07-30 LAB — CBC
HCT: 30.6 % — ABNORMAL LOW (ref 36.0–46.0)
HCT: 23 % — ABNORMAL LOW (ref 36.0–46.0)
Hemoglobin: 9.7 g/dL — ABNORMAL LOW (ref 12.0–15.0)
Hemoglobin: 7.1 g/dL — ABNORMAL LOW (ref 12.0–15.0)
MCH: 28.8 pg (ref 26.0–34.0)
MCH: 28.1 pg (ref 26.0–34.0)
MCHC: 31.7 g/dL (ref 30.0–36.0)
MCHC: 30.9 g/dL (ref 30.0–36.0)
MCV: 90.8 fL (ref 80.0–100.0)
MCV: 90.9 fL (ref 80.0–100.0)
Platelets: 351 10*3/uL (ref 150–400)
Platelets: 332 10*3/uL (ref 150–400)
RBC: 3.37 MIL/uL — ABNORMAL LOW (ref 3.87–5.11)
RBC: 2.53 MIL/uL — ABNORMAL LOW (ref 3.87–5.11)
RDW: 22.5 % — ABNORMAL HIGH (ref 11.5–15.5)
RDW: 22.4 % — ABNORMAL HIGH (ref 11.5–15.5)
WBC: 13.9 10*3/uL — ABNORMAL HIGH (ref 4.0–10.5)
WBC: 10.2 10*3/uL (ref 4.0–10.5)
nRBC: 1.4 % — ABNORMAL HIGH (ref 0.0–0.2)
nRBC: 1.7 % — ABNORMAL HIGH (ref 0.0–0.2)

## 2021-07-30 LAB — BASIC METABOLIC PANEL
Anion gap: 14 (ref 5–15)
BUN: 50 mg/dL — ABNORMAL HIGH (ref 8–23)
CO2: 16 mmol/L — ABNORMAL LOW (ref 22–32)
Calcium: 7.5 mg/dL — ABNORMAL LOW (ref 8.9–10.3)
Chloride: 103 mmol/L (ref 98–111)
Creatinine, Ser: 2.85 mg/dL — ABNORMAL HIGH (ref 0.44–1.00)
GFR, Estimated: 16 mL/min — ABNORMAL LOW (ref >60)
Glucose, Bld: 112 mg/dL — ABNORMAL HIGH (ref 70–99)
Potassium: 5.3 mmol/L — ABNORMAL HIGH (ref 3.5–5.1)
Sodium: 133 mmol/L — ABNORMAL LOW (ref 135–145)

## 2021-07-30 LAB — MYOGLOBIN, URINE: Myoglobin, Ur: 168 ng/mL — ABNORMAL HIGH (ref 0–13)

## 2021-07-30 LAB — HEMOGLOBIN AND HEMATOCRIT, BLOOD
HCT: 21.7 % — ABNORMAL LOW (ref 36.0–46.0)
Hemoglobin: 6.8 g/dL — CL (ref 12.0–15.0)

## 2021-07-30 LAB — PREPARE RBC (CROSSMATCH)

## 2021-07-30 LAB — CK: Total CK: 4252 U/L — ABNORMAL HIGH (ref 38–234)

## 2021-07-30 MED ORDER — ACETAMINOPHEN 325 MG PO TABS
650.0000 mg | ORAL_TABLET | Freq: Three times a day (TID) | ORAL | Status: DC
  Administered 2021-07-30 – 2021-08-07 (×19): 650 mg via ORAL
  Filled 2021-07-30 (×21): qty 2

## 2021-07-30 MED ORDER — SODIUM CHLORIDE 0.9% IV SOLUTION: Freq: Once | INTRAVENOUS | Status: AC

## 2021-07-30 NOTE — Progress Notes (Signed)
Orthopaedic Trauma Service Progress Note  Patient ID: [name] MRN: [id number] DOB/AGE: 1940-08-29 81 y.o.  Subjective:  Awake Ortho issues stable  Moderate serosanguinous drainage in canister   Custom splint for L wrist being fabricated   ROS As above  Objective:   VITALS:   Vitals:   07/29/21 1233 07/29/21 1302 07/30/21 0440 07/30/21 0846  BP: (!) 161/66 133/68 (!) 136/58 133/65  Pulse: 82 83 92 94  Resp: 17 18 16 16   Temp: 97.8 F (36.6 C) (!) 97.5 F (36.4 C) 98.2 F (36.8 C) 98.3 F (36.8 C)  TempSrc:  Oral Oral Oral  SpO2: 93% 92% 98% 94%  Weight:      Height:        Estimated body mass index is 34.44 kg/m as calculated from the following:   Height as of this encounter: 5' (1.524 m).   Weight as of this encounter: 80 kg.   Intake/Output      01/24 0701 01/25 0700 01/25 0701 01/26 0700   P.O. 75 100   I.V. (mL/kg) 2376.7 (29.7)    Blood     Total Intake(mL/kg) 2451.7 (30.6) 100 (1.3)   Urine (mL/kg/hr) 435 (0.2)    Drains 120    Stool 2    Blood 150    Total Output 707    Net +1744.7 +100          LABS  Results for orders placed or performed during the hospital encounter of 07/27/21 (from the past 24 hour(s))  Brain natriuretic peptide     Status: Abnormal   Collection Time: 07/29/21  5:07 PM  Result Value Ref Range   B Natriuretic Peptide 198.9 (H) 0.0 - 100.0 pg/mL  CK     Status: Abnormal   Collection Time: 07/30/21  2:18 AM  Result Value Ref Range   Total CK 4,252 (H) 38 - 234 U/L  Basic metabolic panel     Status: Abnormal   Collection Time: 07/30/21  2:18 AM  Result Value Ref Range   Sodium 133 (L) 135 - 145 mmol/L   Potassium 5.3 (H) 3.5 - 5.1 mmol/L   Chloride 103 98 - 111 mmol/L   CO2 16 (L) 22 - 32 mmol/L   Glucose, Bld 112 (H) 70 - 99 mg/dL   BUN 50 (H) 8 - 23 mg/dL   Creatinine, Ser 2.85 (H) 0.44 - 1.00 mg/dL   Calcium 7.5 (L) 8.9 - 10.3  mg/dL   GFR, Estimated 16 (L) >60 mL/min   Anion gap 14 5 - 15  CBC     Status: Abnormal   Collection Time: 07/30/21  2:18 AM  Result Value Ref Range   WBC 10.2 4.0 - 10.5 K/uL   RBC 2.53 (L) 3.87 - 5.11 MIL/uL   Hemoglobin 7.1 (L) 12.0 - 15.0 g/dL   HCT 23.0 (L) 36.0 - 46.0 %   MCV 90.9 80.0 - 100.0 fL   MCH 28.1 26.0 - 34.0 pg   MCHC 30.9 30.0 - 36.0 g/dL   RDW 22.4 (H) 11.5 - 15.5 %   Platelets 332 150 - 400 K/uL   nRBC 1.7 (H) 0.0 - 0.2 %     PHYSICAL EXAM:   Gen: sitting up in bed, NAD Lungs: unlabored  Ext:       Left  Upper Extremity   Ex fix L upper arm stable  Serosanguinous drainage proximal pinsites   Dressing distal pinsites stable  Ace and wound vac intact  No motor or sensory noted to hand or wrist   Ext warm     Assessment/Plan: 1 Day Post-Op     Anti-infectives (From admission, onward)    Start     Dose/Rate Route Frequency Ordered Stop   07/30/21 0330  ceFAZolin (ANCEF) IVPB 2g/100 mL premix        2 g 200 mL/hr over 30 Minutes Intravenous Every 12 hours 07/29/21 1716 07/30/21 0354   07/29/21 1630  ceFAZolin (ANCEF) IVPB 2g/100 mL premix  Status:  Discontinued        2 g 200 mL/hr over 30 Minutes Intravenous Every 8 hours 07/29/21 1250 07/29/21 1716   07/29/21 0700  ceFAZolin (ANCEF) IVPB 2g/100 mL premix        2 g 200 mL/hr over 30 Minutes Intravenous  Once 07/28/21 2155 07/29/21 0838   07/28/21 0600  ceFAZolin (ANCEF) IVPB 2g/100 mL premix  Status:  Discontinued        2 g 200 mL/hr over 30 Minutes Intravenous On call to O.R. 07/27/21 2313 07/28/21 0012   07/28/21 0020  ceFAZolin (ANCEF) IVPB 2g/100 mL premix        2 g 200 mL/hr over 30 Minutes Intravenous Every 6 hours 07/28/21 0020 07/29/21 0029   07/27/21 2201  vancomycin (VANCOCIN) powder  Status:  Discontinued          As needed 07/27/21 2202 07/27/21 2234   07/27/21 1957  ceFAZolin (ANCEF) 2-4 GM/100ML-% IVPB       Note to Pharmacy: Ubaldo Glassing M: cabinet override      07/27/21  1957 07/28/21 8119     .  POD/HD#: 1  81 y/o female with L humeral shaft fracture and L forearm compartment syndrome   -closed L humerus fracture s/p Ex fix  NWB L uex  Definitive fixation TBD  - L forearm compartment syndrome likely due to prolonged external compression from mechanism of injury s/p fasciotomies and I&D  Continue with vac   Will likely dc vac Saturday   Fasciotomies closed yesterday    Custom brace to be made by OT to help maintain position of function/minimize sequela of contractures     Extensive debridement of volar forearm compartment    Muscles of dorsal compartment noncontractile but had good color    No ROM restrictions     - Pain management:  Scheduled tylenol   - ABL anemia/Hemodynamics  Monitor   Cbc this pm   - Medical issues   Per primary    AKI     Due to rhabdo   - DVT/PE prophylaxis:  Scds  Pharmacologics on hold due to ABLA  - ID:   Periop abx   - Dispo:  Continue with inpatient care        Jari Pigg, PA-C [phone] (C) 07/30/2021, 10:02 AM  [facility] [place] [phone] Jenetta Downer) [phone] (F)    After 5pm and on the weekends please log on to Amion, go to orthopaedics and the look under the [facility] Call for the provider(s) on call. You can also call our office at [phone] and then follow the prompts to be connected to the call team.   Patient ID: [name], female   DOB: [birth year], 81 y.o.   MRN: [id number]

## 2021-07-30 NOTE — Progress Notes (Signed)
° ° ° °  Subjective:  Patient comfortable in bed. Not showing signs of any pain. Not able to move fingers or wrist actively. Son at bedside. CK conitnues to downtrend now down to 4k. Hgb 7.1. Discussed plan for return to OR once more medically stable for ORIF  of her humerus fx.  Objective:   VITALS:   Vitals:   07/29/21 1233 07/29/21 1302 07/30/21 0440 07/30/21 0846  BP: (!) 161/66 133/68 (!) 136/58 133/65  Pulse: 82 83 92 94  Resp: 17 18 16 16   Temp: 97.8 F (36.6 C) (!) 97.5 F (36.4 C) 98.2 F (36.8 C) 98.3 F (36.8 C)  TempSrc:  Oral Oral Oral  SpO2: 93% 92% 98% 94%  Weight:      Height:       Ex-Fix pin sites dressings clean dry and intact.  Upper arm and forearm compartments are soft.  Wound VAC dressing holding suction with serosang drainage. Fingers warm well-perfused with brisk cap refill. Moderate distal edema in hand. Patient unable to demonstrate any motor or sensory function in the hand.    Lab Results  Component Value Date   WBC 10.2 07/30/2021   HGB 7.1 (L) 07/30/2021   HCT 23.0 (L) 07/30/2021   MCV 90.9 07/30/2021   PLT 332 07/30/2021   BMET    Component Value Date/Time   NA 133 (L) 07/30/2021 0218   NA 143 06/07/2020 1630   K 5.3 (H) 07/30/2021 0218   CL 103 07/30/2021 0218   CO2 16 (L) 07/30/2021 0218   GLUCOSE 112 (H) 07/30/2021 0218   BUN 50 (H) 07/30/2021 0218   BUN 25 06/07/2020 1630   CREATININE 2.85 (H) 07/30/2021 0218   CALCIUM 7.5 (L) 07/30/2021 0218   GFRNONAA 16 (L) 07/30/2021 0218    Assessment/Plan: 1 Day Post-Op   Principal Problem:   Left humeral fracture Active Problems:   Essential hypertension   Traumatic rhabdomyolysis (HCC)   Alzheimer disease (HCC)   Leukocytosis   Anemia   Thoracic ascending aortic aneurysm   Hyperlipidemia   Status post left forearm fasciotomies for compartment syndrome, left humerus fracture closed reduction application of external fixator on 1/22, return to OR 1/24 with Dr. Marcelino Scot for left  forearm necrotic muscle debridement and wound closure   Post op recs: WB: Nonweightbearing left upper extremity Abx: ancef x23 hours post op Dressing: Wound VAC forearm 125 mmHg continuous DVT prophylaxis: lovenox starting POD1 x4 weeks Disposition: TBD, plan for return to OR for ex-fix removal and ORIF humerus fx Address: [address], [place], [place] [address]  Office Phone: [phone]    [place] 07/30/2021, 12:05 PM   Charlies Constable, MD  Contact information:   [phone] 7am-5pm epic message Dr. Zachery Dakins, or call office for patient follow up: [phone] After hours and holidays please check [email or web address] for group call information for [facility]

## 2021-07-30 NOTE — Progress Notes (Signed)
PROGRESS NOTE    [name]  JEH:[id number] DOB: [birth year] DOA: 07/27/2021 PCP: Orpah Melter, MD   Brief Narrative:  [name] is a 81 y.o. female with medical history significant of HTN, alzheimers dementia, anemia,  anxiety and depression who presents to ED after fall with subsequent left arm trauma. History is from her son due to her dementia. Ortho consulted given compartment syndrome/humerus fracture on L side.  1/23 status post surgery on 07/27/2021.  Hemoglobin 6.0, getting 2 units of packed red blood cell transfusion this a.m. 1/24 s/p ORIF for repeat evaluation of left forearm status post fasciotomies possible debridement of nonviable necrotic muscle, possible wound closure today by orthopedics.  FOB positive, creatinine up 1/25 pain well controlled attempting to ambulate with PT later today if possible -has been difficult given her walker bound status with left arm fracture  Assessment & Plan:   Left humeral fracture- (present on admission) S/p fasciotomy Lt forearm, external fixation Lt humerus, wound vac application on 3/78 5/88 s/p OR for compartment closure -tolerated well Lovenox on hold, defer to orthopedics PT OT ongoing, likely disposition to SNF given walker bound status and inability to use walker given left arm fracture (currently nonweightbearing per Ortho).   Traumatic rhabdomyolysis ([place])- (present on admission) AKI, decreased urine output Concurrent hyperkalemia -Ck trending down now -Continue low rate IV fluids given poor p.o. intake -Creatinine continues to trend upwards, urine output low overnight but improving this morning -Potassium minimally elevated, should correct with increased urine output   Acute blood loss anemia due to surgery Chronic anemia, iron deficiency - Hemoglobin 6.0, status post 2 units repeat hemoglobin stabilizing 9.7 - Iron panel with low iron.  - Fobt positive -follow clinically -Previous discussion with son unsure if  patient has had colonoscopy in the past - he has opted to hold off on calling GI unless hemoglobin continues to drop.   Leukocytosis- (present on admission) Likely stress-induced/reactive Improving   Essential hypertension- (present on admission) BP stable continue to monitor   Alzheimer disease ([place])- (present on admission) At baseline, delirium precautions    Thoracic ascending aortic aneurysm- (present on admission) Noted and follows outpatient.    Hyperlipidemia Holding statin due to rhabdomyolysis   Hyponatremia Mild, chronic, no indication for treatment   DVT prophylaxis: scd, hold lovenox due to acute blood loss Code Status:full Family Communication: son updated previously  Status is: Inpatient  Dispo: The patient is from: Home              Anticipated d/c is to: SNF              Anticipated d/c date is: 48 to 72 hours              Patient currently not medically stable for discharge  Consultants:  Orthopedic surgery  Procedures:  ORIF left humerus  Antimicrobials:  None  Subjective: No acute issues or events overnight  Objective: Vitals:   07/29/21 1219 07/29/21 1233 07/29/21 1302 07/30/21 0440  BP: (!) 161/61 (!) 161/66 133/68 (!) 136/58  Pulse: 83 82 83 92  Resp: 14 17 18 16   Temp:  97.8 F (36.6 C) (!) 97.5 F (36.4 C) 98.2 F (36.8 C)  TempSrc:   Oral Oral  SpO2: 93% 93% 92% 98%  Weight:      Height:        Intake/Output Summary (Last 24 hours) at 07/30/2021 0816 Last data filed at 07/29/2021 1900 Gross per 24 hour  Intake 2451.65 ml  Output 707 ml  Net 1744.65 ml   Filed Weights   07/29/21 0004 07/29/21 0754  Weight: 80 kg 80 kg    Examination:  General exam: Appears calm and comfortable  Respiratory system: Clear to auscultation. Respiratory effort normal. Cardiovascular system: S1 & S2 heard, RRR. No JVD, murmurs, rubs, gallops or clicks. No pedal edema. Gastrointestinal system: Abdomen is nondistended, soft and nontender. No  organomegaly or masses felt. Normal bowel sounds heard. Central nervous system: Alert and oriented. No focal neurological deficits. Extremities: Symmetric 5 x 5 power. Skin: No rashes, lesions or ulcers Psychiatry: Judgement and insight appear normal. Mood & affect appropriate.   Data Reviewed: I have personally reviewed following labs and imaging studies  CBC: Recent Labs  Lab 07/27/21 1237 07/28/21 0049 07/28/21 1920 07/29/21 0306 07/30/21 0218  WBC 20.5* 12.3* 13.9* 13.0* 10.2  NEUTROABS 18.0*  --   --   --   --   HGB 7.6* 6.0* 9.7* 8.9* 7.1*  HCT 24.4* 20.5* 30.6* 28.4* 23.0*  MCV 100.8* 103.5* 90.8 90.2 90.9  PLT 523* 354 351 340 993   Basic Metabolic Panel: Recent Labs  Lab 07/27/21 1237 07/28/21 0049 07/29/21 0306 07/30/21 0218  NA 134* 136 132* 133*  K 4.6 5.0 5.1 5.3*  CL 106 110 104 103  CO2 20* 19* 18* 16*  GLUCOSE 155* 146* 127* 112*  BUN 30* 29* 44* 50*  CREATININE 1.10* 1.14* 2.53* 2.85*  CALCIUM 8.2* 7.2* 7.1* 7.5*  MG  --  2.2  --   --   PHOS  --  5.9*  --   --    GFR: Estimated Creatinine Clearance: 14.7 mL/min (A) (by C-G formula based on SCr of 2.85 mg/dL (H)). Liver Function Tests: No results for input(s): AST, ALT, ALKPHOS, BILITOT, PROT, ALBUMIN in the last 168 hours. No results for input(s): LIPASE, AMYLASE in the last 168 hours. No results for input(s): AMMONIA in the last 168 hours. Coagulation Profile: No results for input(s): INR, PROTIME in the last 168 hours. Cardiac Enzymes: Recent Labs  Lab 07/27/21 1237 07/28/21 0049 07/29/21 0306 07/30/21 0218  CKTOTAL 23,294* 39,443* 8,090* 4,252*   BNP (last 3 results) No results for input(s): PROBNP in the last 8760 hours. HbA1C: No results for input(s): HGBA1C in the last 72 hours. CBG: No results for input(s): GLUCAP in the last 168 hours. Lipid Profile: No results for input(s): CHOL, HDL, LDLCALC, TRIG, CHOLHDL, LDLDIRECT in the last 72 hours. Thyroid Function Tests: No results  for input(s): TSH, T4TOTAL, FREET4, T3FREE, THYROIDAB in the last 72 hours. Anemia Panel: Recent Labs    07/28/21 0049  VITAMINB12 391  FOLATE 19.1  FERRITIN 48  TIBC 246*  IRON 13*   Sepsis Labs: No results for input(s): PROCALCITON, LATICACIDVEN in the last 168 hours.  Recent Results (from the past 240 hour(s))  Resp Panel by RT-PCR (Flu A&B, Covid) Nasopharyngeal Swab     Status: None   Collection Time: 07/27/21 11:49 AM   Specimen: Nasopharyngeal Swab; Nasopharyngeal(NP) swabs in vial transport medium  Result Value Ref Range Status   SARS Coronavirus 2 by RT PCR NEGATIVE NEGATIVE Final    Comment: (NOTE) SARS-CoV-2 target nucleic acids are NOT DETECTED.  The SARS-CoV-2 RNA is generally detectable in upper respiratory specimens during the acute phase of infection. The lowest concentration of SARS-CoV-2 viral copies this assay can detect is 138 copies/mL. A negative result does not preclude SARS-Cov-2 infection and should not be used as the sole basis  for treatment or other patient management decisions. A negative result may occur with  improper specimen collection/handling, submission of specimen other than nasopharyngeal swab, presence of viral mutation(s) within the areas targeted by this assay, and inadequate number of viral copies(<138 copies/mL). A negative result must be combined with clinical observations, patient history, and epidemiological information. The expected result is Negative.  Fact Sheet for Patients:  EntrepreneurPulse.com.au  Fact Sheet for Healthcare Providers:  IncredibleEmployment.be  This test is no t yet approved or cleared by the [place] FDA and  has been authorized for detection and/or diagnosis of SARS-CoV-2 by FDA under an Emergency Use Authorization (EUA). This EUA will remain  in effect (meaning this test can be used) for the duration of the COVID-19 declaration under Section 564(b)(1) of the  Act, 21 U.S.C.section 360bbb-3(b)(1), unless the authorization is terminated  or revoked sooner.       Influenza A by PCR NEGATIVE NEGATIVE Final   Influenza B by PCR NEGATIVE NEGATIVE Final    Comment: (NOTE) The Xpert Xpress SARS-CoV-2/FLU/RSV plus assay is intended as an aid in the diagnosis of influenza from Nasopharyngeal swab specimens and should not be used as a sole basis for treatment. Nasal washings and aspirates are unacceptable for Xpert Xpress SARS-CoV-2/FLU/RSV testing.  Fact Sheet for Patients: EntrepreneurPulse.com.au  Fact Sheet for Healthcare Providers: IncredibleEmployment.be  This test is not yet approved or cleared by the [place] FDA and has been authorized for detection and/or diagnosis of SARS-CoV-2 by FDA under an Emergency Use Authorization (EUA). This EUA will remain in effect (meaning this test can be used) for the duration of the COVID-19 declaration under Section 564(b)(1) of the Act, 21 U.S.C. section 360bbb-3(b)(1), unless the authorization is terminated or revoked.  Performed at [facility], [place] [address], [place],  [address]          Radiology Studies: US RENAL  Result Date: 07/29/2021 CLINICAL DATA:  Renal dysfunction EXAM: RENAL / URINARY TRACT ULTRASOUND COMPLETE COMPARISON:  None. FINDINGS: Right Kidney: Renal measurements: 9 x 5.8 x 5 cm = volume: 137.7 mL. There is no hydronephrosis. Cortical echogenicity is unremarkable. Evaluation is technically difficult due to patient's body habitus. Left Kidney: Renal measurements: 11.2 x 6.3 x 5.3 cm = volume: 193.4 mL. Evaluation is technically limited. As far as seen, there is no hydronephrosis or perinephric fluid collection. Cortical echogenicity is unremarkable. Bladder: Urinary bladder is empty and not evaluated. Other: None. IMPRESSION: There is no hydronephrosis. Electronically Signed   By: Elmer Picker M.D.   On: 07/29/2021  18:18    Scheduled Meds:  sodium chloride   Intravenous Once   Chlorhexidine Gluconate Cloth  6 each Topical Daily   docusate sodium  100 mg Oral BID   enoxaparin  30 mg Subcutaneous Q24H   mouth rinse  15 mL Mouth Rinse BID   senna  1 tablet Oral BID   sodium zirconium cyclosilicate  10 g Oral Once   Continuous Infusions:  sodium chloride 100 mL/hr at 07/30/21 0008   sodium chloride      LOS: 3 days   Time spent: 72min  Koury Roddy C Diarra Ceja, DO [facility]  If 7PM-7AM, please contact night-coverage [email or web address]  07/30/2021, 8:16 AM

## 2021-07-30 NOTE — Progress Notes (Signed)
Pt kept taking off telemetry leads; Attending physician on call made aware,agreeable to place it on hold til morning shift starts.CCMD also notified.Pt noted to have Alzheimer's in one of her problem lists.

## 2021-07-30 NOTE — Progress Notes (Signed)
Occupational Therapy Treatment Patient Details Name: [name] MRN: [id number] DOB: 1941/02/23 Today's Date: 07/30/2021   History of present illness Patient is a 80 y/o female who presents on 07/27/21 with left arm trauma after getting LUE stuck in a dresser. Found to have left humerus fx and rhabdomyolosis. s/p fasciotomy, external fixator and wound vac placement 07/27/21. PMH includes dementia, osteoporosis, anxiety, depression, HTN, skin ca, HTN.   OT comments  Focus of session on LUE assessment. L hand in dependent position with significant edema. Pt moving L shoulder and elbow as tolerated, however no AROM in hand/wrist observed/decreased sensation noted. PROM limited by edema, however overall grossly WFL. Will return to fabricate modified L resting hand splint to increase functional position L hand/wrist to prevent contracture formation.   Recommendations for follow up therapy are one component of a multi-disciplinary discharge planning process, led by the attending physician.  Recommendations may be updated based on patient status, additional functional criteria and insurance authorization.    Follow Up Recommendations  Skilled nursing-short term rehab (<3 hours/day)    Assistance Recommended at Discharge Frequent or constant Supervision/Assistance  Patient can return home with the following      Equipment Recommendations  None recommended by OT    Recommendations for Other Services      Precautions / Restrictions Precautions Precautions: Fall Precaution Comments: A/AA/PROM ROM as tolerated LUE Required Braces or Orthoses: Other Brace Other Brace: external fixator Restrictions Weight Bearing Restrictions: Yes LUE Weight Bearing: Non weight bearing       Mobility Bed Mobility                    Transfers                         Balance                                           ADL either performed or assessed with clinical  judgement   ADL                                         General ADL Comments: A for all ADL; see prior note, not addressed this session    Extremity/Trunk Assessment Upper Extremity Assessment Upper Extremity Assessment: LUE deficits/detail LUE Deficits / Details: moving shoulder and elbow as tolerated by Ext Fix, edema and  wound vac; no active movement of hand/wrist/ PROM grossly WFL however limited by signficant swelling; On entry, hand in dependent position with significant dependent edema; Decreased sensation throughout LUE below elbow; Forearm with bandage/ace wrap secured with thumb cut out LUE Coordination: decreased fine motor;decreased gross motor   Lower Extremity Assessment Lower Extremity Assessment: Defer to PT evaluation (L hip pain)        Vision       Perception     Praxis      Cognition Arousal/Alertness: Awake/alert Behavior During Therapy: WFL for tasks assessed/performed Overall Cognitive Status: No family/caregiver present to determine baseline cognitive functioning                                 General Comments: Poor STM; decreased insight/awareness  Exercises Exercises: General Upper Extremity General Exercises - Upper Extremity Shoulder Flexion: AAROM, Left, 5 reps, Supine Elbow Flexion: AAROM, AROM, Left, 10 reps Elbow Extension: AROM, AAROM, Left, 10 reps Wrist Flexion: PROM, Left, 10 reps Wrist Extension: PROM, Left, 10 reps Digit Composite Flexion: PROM, Left, 10 reps Composite Extension: PROM, Left, 10 reps    Shoulder Instructions       General Comments blisters noted on upper arm; pt with increased riskof MASD due to body habitus and positioning of L upper arm. Pillow case positioned in axilla and between trunk and upper arm to wick away moisture    Pertinent Vitals/ Pain       Pain Assessment Pain Assessment: Faces Faces Pain Scale: Hurts little more Pain Location: L shoulder and hip Pain  Descriptors / Indicators: Grimacing, Guarding, Operative site guarding, Moaning Pain Intervention(s): Limited activity within patient's tolerance  Home Living                                          Prior Functioning/Environment              Frequency  Min 3X/week        Progress Toward Goals  OT Goals(current goals can now be found in the care plan section)  Progress towards OT goals: Progressing toward goals (splint and ROM goals established)  Acute Rehab OT Goals Patient Stated Goal: to get better OT Goal Formulation: Patient unable to participate in goal setting Time For Goal Achievement: 08/11/21 Potential to Achieve Goals: Good ADL Goals Pt Will Perform Grooming: with min assist;sitting Pt Will Perform Upper Body Dressing: with min assist;sitting Pt Will Transfer to Toilet: with mod assist;with +2 assist;stand pivot transfer  Plan Discharge plan remains appropriate    Co-evaluation                 AM-PAC OT "6 Clicks" Daily Activity     Outcome Measure   Help from another person eating meals?: A Little Help from another person taking care of personal grooming?: A Little Help from another person toileting, which includes using toliet, bedpan, or urinal?: Total Help from another person bathing (including washing, rinsing, drying)?: Total Help from another person to put on and taking off regular upper body clothing?: A Lot Help from another person to put on and taking off regular lower body clothing?: Total 6 Click Score: 11    End of Session    OT Visit Diagnosis: Unsteadiness on feet (R26.81);Pain;Other abnormalities of gait and mobility (R26.89);Muscle weakness (generalized) (M62.81);Other symptoms and signs involving cognitive function Pain - part of body: Shoulder   Activity Tolerance Patient tolerated treatment well   Patient Left in bed;with call bell/phone within reach;with bed alarm set   Nurse Communication Other  (comment);Weight bearing status;Precautions        Time: 7048-8891 OT Time Calculation (min): 16 min  Charges: OT General Charges $OT Visit: 1 Visit OT Treatments $Therapeutic Activity: 8-22 mins  Maurie Boettcher, OT/L   Acute OT Clinical Specialist [place] Pager [phone] Office [phone]   [facility] 07/30/2021, 2:14 PM

## 2021-07-30 NOTE — Progress Notes (Addendum)
Occupational Therapy Treatment Patient Details Name: [name] MRN: [id number] DOB: 1941-06-01 Today's Date: 07/30/2021   History of present illness Patient is a 81 y/o female who presents on 07/27/21 with left arm trauma after getting LUE stuck in a dresser. Found to have left humerus fx and rhabdomyolosis. s/p fasciotomy, external fixator and wound vac placement 07/27/21. PMH includes dementia, osteoporosis, anxiety, depression, HTN, skin ca, HTN.   OT comments  L modified resting hand splint fabricated in an intrinsic plus position to increase wrist extension, flex MPs and extend IPs to improve functional positioning and reduce risk of contracture formation. Also assisted pt rolling on/off bedpan during session with Max A +2. Pillow placed on abdomen to help increase comfort of L shoulder when rolling R. Pillow case placed between trunk and upper arm to reduce risk of MASD - discussed with nsg, may want ot use Interdry. Clarified orders with PA Ainsley Spinner - No ROM restrictions LUE; NWB. Educated staff on splint and importance of keeping LUE elevated with hand above elbow to decrease dependent edema. Son present and began education regarding ROM hand/wrist and elbow. Will return for splint check this pm.    Recommendations for follow up therapy are one component of a multi-disciplinary discharge planning process, led by the attending physician.  Recommendations may be updated based on patient status, additional functional criteria and insurance authorization.    Follow Up Recommendations  Skilled nursing-short term rehab (<3 hours/day)    Assistance Recommended at Discharge Frequent or constant Supervision/Assistance  Patient can return home with the following      Equipment Recommendations  None recommended by OT    Recommendations for Other Services      Precautions / Restrictions Precautions Precautions: Fall Precaution Comments: A/AA/PROM ROM as tolerated LUE Required Braces or  Orthoses: Other Brace Other Brace: external fixator Restrictions Weight Bearing Restrictions: Yes LUE Weight Bearing: Non weight bearing       Mobility Bed Mobility               General bed mobility comments: Max A +2 for rolling; encouraged pt to push through BLE    Transfers                         Balance                                           ADL either performed or assessed with clinical judgement   ADL                                         General ADL Comments: Pt having to have a BM during session. Required Max A +2 to roll to get on the bedpan. When rolling toward R, place pillow on chest adn osition LUE on pillow    Extremity/Trunk Assessment Upper Extremity Assessment Upper Extremity Assessment: LUE deficits/detail LUE Deficits / Details: moving shoulder and elbow as tolerated by Ext Fix, edema and  wound vac; no active movement of hand/wrist/ PROM grossly [facility] however limited by signficant swelling; On entry, hand in dependent position with significant dependent edema; Decreased sensation throughout LUE below elbow; Forearm with bandage/ace wrap secured with thumb cut out LUE Coordination: decreased fine motor;decreased gross motor  Lower Extremity Assessment Lower Extremity Assessment: Defer to PT evaluation (L hip pain)        Vision       Perception     Praxis      Cognition Arousal/Alertness: Awake/alert Behavior During Therapy: WFL for tasks assessed/performed Overall Cognitive Status: No family/caregiver present to determine baseline cognitive functioning                                 General Comments: Poor STM; decreased insight/awareness        Exercises Exercises: General Upper Extremity General Exercises - Upper Extremity Shoulder Flexion: AAROM, Left, 5 reps, Supine Elbow Flexion: AAROM, AROM, Left, 10 reps Elbow Extension: AROM, AAROM, Left, 10 reps Wrist  Flexion: PROM, Left, 10 reps Wrist Extension: PROM, Left, 10 reps Digit Composite Flexion: PROM, Left, 10 reps Composite Extension: PROM, Left, 10 reps    Shoulder Instructions       General Comments Splint fabricated; increased time talking with pt's son regarding rehab after DC and importance of completing ROM Lhand/wrist throughout the day    Pertinent Vitals/ Pain       Pain Assessment Pain Assessment: Faces Faces Pain Scale: Hurts little more Pain Location: L shoulder and hip Pain Descriptors / Indicators: Grimacing, Guarding, Operative site guarding, Moaning Pain Intervention(s): Limited activity within patient's tolerance  Home Living                                          Prior Functioning/Environment              Frequency  Min 3X/week        Progress Toward Goals  OT Goals(current goals can now be found in the care plan section)  Progress towards OT goals:  (goals established)  Acute Rehab OT Goals Patient Stated Goal: to get better OT Goal Formulation: With family Time For Goal Achievement: 08/11/21 Potential to Achieve Goals: Good ADL Goals Pt Will Perform Grooming: with min assist;sitting Pt Will Perform Upper Body Dressing: with min assist;sitting Pt Will Transfer to Toilet: with mod assist;with +2 assist;stand pivot transfer Pt/caregiver will Perform Home Exercise Program: Left upper extremity;Increased ROM;With written HEP provided;With minimal assist Additional ADL Goal #1: Pt will tolerate L resting hadn splint for 2 hour intervals to reduce risk of contracture formation Additional ADL Goal #2: Family will demonstrate appropirate positioning to decrease dependent edema L hand/forearm  Plan Discharge plan remains appropriate    Co-evaluation                 AM-PAC OT "6 Clicks" Daily Activity     Outcome Measure   Help from another person eating meals?: A Little Help from another person taking care of personal  grooming?: A Little Help from another person toileting, which includes using toliet, bedpan, or urinal?: Total Help from another person bathing (including washing, rinsing, drying)?: Total Help from another person to put on and taking off regular upper body clothing?: A Lot Help from another person to put on and taking off regular lower body clothing?: Total 6 Click Score: 11    End of Session    OT Visit Diagnosis: Unsteadiness on feet (R26.81);Pain;Other abnormalities of gait and mobility (R26.89);Muscle weakness (generalized) (M62.81);Other symptoms and signs involving cognitive function Pain - part of body: Shoulder   Activity  Tolerance Patient tolerated treatment well   Patient Left in bed;with call bell/phone within reach;with bed alarm set   Nurse Communication Mobility status;Precautions;Weight bearing status        Time: 1130-1305 OT Time Calculation (min): 95 min  Charges: OT General Charges $OT Visit: 1 Visit OT Treatments $Therapeutic Activity: 8-22 mins $Orthotics Fit/Training: 53-67 mins $ Splint materials basic: [place], OT/L   Acute OT Clinical Specialist Acute Rehabilitation Services Pager [phone] Office [phone]   [facility] 07/30/2021, 2:47 PM

## 2021-07-30 NOTE — Progress Notes (Signed)
Occupational Therapy Treatment Patient Details Name: [name] MRN: [id number] DOB: Jul 03, 1941 Today's Date: 07/30/2021   History of present illness Patient is a 81 y/o female who presents on 07/27/21 with left arm trauma after getting LUE stuck in a dresser. Found to have left humerus fx and rhabdomyolosis. s/p fasciotomy, external fixator and wound vac placement 07/27/21. PMH includes dementia, osteoporosis, anxiety, depression, HTN, skin ca, HTN.   OT comments  Pt seen for splint check and continued education with son regarding ROM, positioning and edema management. Pt tolerating splint without problems. Recommend pt wear splint 2 hours on/2 hours off. Keep LUE elevated on at least 2 pillows at all times with hand elevated above elbow. Will continue to follow.   Recommendations for follow up therapy are one component of a multi-disciplinary discharge planning process, led by the attending physician.  Recommendations may be updated based on patient status, additional functional criteria and insurance authorization.    Follow Up Recommendations  Skilled nursing-short term rehab (<3 hours/day)    Assistance Recommended at Discharge Frequent or constant Supervision/Assistance  Patient can return home with the following      Equipment Recommendations  None recommended by OT    Recommendations for Other Services      Precautions / Restrictions Precautions Precautions: Fall Precaution Comments: A/AA/PROM ROM as tolerated LUE Required Braces or Orthoses: Other Brace Other Brace: external fixator Restrictions Weight Bearing Restrictions: Yes LUE Weight Bearing: Non weight bearing          Balance                                           ADL either performed or assessed with clinical judgement   ADL                                         General ADL Comments: Pt having to have a BM during session. Required Max A +2 to roll to get on the  bedpan. When rolling toward R, place pillow on chest and position LUE on pillow    Extremity/Trunk Assessment              Vision       Perception     Praxis      Cognition Arousal/Alertness: Awake/alert Behavior During Therapy: WFL for tasks assessed/performed Overall Cognitive Status: No family/caregiver present to determine baseline cognitive functioning                                 General Comments: Poor STM; decreased insight/awareness        Exercises Exercises: Other exercises General Exercises - Upper Extremity Shoulder Flexion: AAROM, Left, 5 reps, Supine Elbow Flexion: AAROM, Left, 10 reps Elbow Extension: AAROM, Left, 10 reps Wrist Flexion: PROM, Left, 10 reps Wrist Extension: PROM, Left, 10 reps Digit Composite Flexion: PROM, Left, 10 reps Composite Extension: PROM, Left, 10 reps Other Exercises Other Exercises: Educated pt's son on hand/wrist PROM adn so able to return demonstrate. Will plan to give [place] handout Other Exercises: gentle ROM of L elbow flex/ext form @ 30-80 degrees Other Exercises: Educated son/nsg on importance of elevation, in addition to gentle retrograde massage to decrease edema Other Exercises: Educated son/pt/nsg on  splint wearing time. Recommend 2 hours on/2 hours off at this time    Shoulder Instructions       General Comments Splint fabricated; increased time talking with pt's son regarding rehab after DC and importance of completing ROM Lhand/wrist throughout the day    Pertinent Vitals/ Pain       Pain Assessment Pain Assessment: Faces Faces Pain Scale: Hurts little more Pain Location: L shoulder and hip Pain Descriptors / Indicators: Grimacing, Guarding, Operative site guarding, Moaning Pain Intervention(s): Limited activity within patient's tolerance  Home Living                                          Prior Functioning/Environment              Frequency  Min 3X/week         Progress Toward Goals  OT Goals(current goals can now be found in the care plan section)  Progress towards OT goals: Progressing toward goals  Acute Rehab OT Goals Patient Stated Goal: to get better OT Goal Formulation: With family Time For Goal Achievement: 08/11/21 Potential to Achieve Goals: Good ADL Goals Pt Will Perform Grooming: with min assist;sitting Pt Will Perform Upper Body Dressing: with min assist;sitting Pt Will Transfer to Toilet: with mod assist;with +2 assist;stand pivot transfer Pt/caregiver will Perform Home Exercise Program: Left upper extremity;Increased ROM;With written HEP provided;With minimal assist Additional ADL Goal #1: Pt will tolerate L resting hadn splint for 2 hour intervals to reduce risk of contracture formation Additional ADL Goal #2: Family will demonstrate appropirate positioning to decrease dependent edema L hand/forearm  Plan Discharge plan remains appropriate    Co-evaluation                 AM-PAC OT "6 Clicks" Daily Activity     Outcome Measure   Help from another person eating meals?: A Little Help from another person taking care of personal grooming?: A Little Help from another person toileting, which includes using toliet, bedpan, or urinal?: Total Help from another person bathing (including washing, rinsing, drying)?: Total Help from another person to put on and taking off regular upper body clothing?: A Lot Help from another person to put on and taking off regular lower body clothing?: Total 6 Click Score: 11    End of Session    OT Visit Diagnosis: Unsteadiness on feet (R26.81);Pain;Other abnormalities of gait and mobility (R26.89);Muscle weakness (generalized) (M62.81);Other symptoms and signs involving cognitive function Pain - Right/Left: Left Pain - part of body: Shoulder   Activity Tolerance Patient tolerated treatment well   Patient Left in bed;with call bell/phone within reach;with bed alarm set    Nurse Communication Mobility status;Precautions;Weight bearing status        Time: 1610-9604 OT Time Calculation (min): 22 min  Charges: OT General Charges $OT Visit: 1 Visit OT Treatments $Therapeutic Activity: 8-22 mins   .  Gerry Heaphy,HILLARY 07/30/2021, 5:09 PM Maurie Boettcher, OT/L   Acute OT Clinical Specialist Acute Rehabilitation Services Pager [phone] Office [phone]

## 2021-07-30 NOTE — TOC Progression Note (Addendum)
Transition of Care [facility]) - Progression Note    Patient Details  Name: [name] MRN: [id number] Date of Birth: Jan 06, 1941  [facility] CM/SW Contact  Joanne Chars, LCSW Phone Number: 07/30/2021, 2:15 PM  Clinical Narrative:  CSW came to room with bed offers.  Son on a work phone call, CSW told pt about offers and wrote them down on [email or web address] document.   Will check back later with son.   1555: CSW spoke with son, who would like to accept offer to [facility].  [place] notified.    Expected Discharge Plan: [place] Barriers to Discharge: Continued Medical Work up, SNF Pending bed offer  Expected Discharge Plan and Services Expected Discharge Plan: [place] Choice: [place] arrangements for the past 2 months: [place] ([place])                                       Social Determinants of Health (SDOH) Interventions    Readmission Risk Interventions No flowsheet data found.

## 2021-07-30 NOTE — Care Management Important Message (Signed)
Important Message  Patient Details  Name: [name] MRN: [id number] Date of Birth: 1941-06-21   Medicare Important Message Given:  Yes     Hannah Beat 07/30/2021, 10:51 AM

## 2021-07-31 LAB — TYPE AND SCREEN
ABO/RH(D): O POS
Antibody Screen: NEGATIVE
Unit division: 0
Unit division: 0
Unit division: 0

## 2021-07-31 LAB — BASIC METABOLIC PANEL
Anion gap: 8 (ref 5–15)
BUN: 58 mg/dL — ABNORMAL HIGH (ref 8–23)
CO2: 19 mmol/L — ABNORMAL LOW (ref 22–32)
Calcium: 7.6 mg/dL — ABNORMAL LOW (ref 8.9–10.3)
Chloride: 105 mmol/L (ref 98–111)
Creatinine, Ser: 2.91 mg/dL — ABNORMAL HIGH (ref 0.44–1.00)
GFR, Estimated: 16 mL/min — ABNORMAL LOW (ref >60)
Glucose, Bld: 107 mg/dL — ABNORMAL HIGH (ref 70–99)
Potassium: 4.8 mmol/L (ref 3.5–5.1)
Sodium: 132 mmol/L — ABNORMAL LOW (ref 135–145)

## 2021-07-31 LAB — CBC
HCT: 23.4 % — ABNORMAL LOW (ref 36.0–46.0)
Hemoglobin: 7.6 g/dL — ABNORMAL LOW (ref 12.0–15.0)
MCH: 29.1 pg (ref 26.0–34.0)
MCHC: 32.5 g/dL (ref 30.0–36.0)
MCV: 89.7 fL (ref 80.0–100.0)
Platelets: 304 10*3/uL (ref 150–400)
RBC: 2.61 MIL/uL — ABNORMAL LOW (ref 3.87–5.11)
RDW: 19.6 % — ABNORMAL HIGH (ref 11.5–15.5)
WBC: 8.8 10*3/uL (ref 4.0–10.5)
nRBC: 1.3 % — ABNORMAL HIGH (ref 0.0–0.2)

## 2021-07-31 LAB — BPAM RBC
Blood Product Expiration Date: 202301292359
Blood Product Expiration Date: 202301312359
Blood Product Expiration Date: 202301312359
ISSUE DATE / TIME: 202301231031
ISSUE DATE / TIME: 202301231413
ISSUE DATE / TIME: 202301251648
Unit Type and Rh: 5100
Unit Type and Rh: 5100
Unit Type and Rh: 9500

## 2021-07-31 LAB — CK: Total CK: 2840 U/L — ABNORMAL HIGH (ref 38–234)

## 2021-07-31 LAB — CALCIUM, IONIZED: Calcium, Ionized, Serum: 4.1 mg/dL — ABNORMAL LOW (ref 4.5–5.6)

## 2021-07-31 MED ORDER — PANTOPRAZOLE SODIUM 40 MG PO TBEC
40.0000 mg | DELAYED_RELEASE_TABLET | Freq: Two times a day (BID) | ORAL | Status: DC
  Administered 2021-07-31 – 2021-08-07 (×14): 40 mg via ORAL
  Filled 2021-07-31 (×14): qty 1

## 2021-07-31 NOTE — Progress Notes (Signed)
Pt keeps taking telemetry leads off due to dementia; night MD & CCMD made aware.

## 2021-07-31 NOTE — Progress Notes (Signed)
PROGRESS NOTE    [name]  BJY:[id number] DOB: 15-Jun-1941 DOA: 07/27/2021 PCP: Orpah Melter, MD   Brief Narrative:  [name] is a 81 y.o. female with medical history significant of HTN, alzheimers dementia, anemia,  anxiety and depression who presents to ED after fall with subsequent left arm trauma. History is from her son due to her dementia. Ortho consulted given compartment syndrome/humerus fracture on L side.  1/23 status post surgery on 07/27/2021.  Hemoglobin 6.0, getting 2 units of packed red blood cell transfusion this a.m. 1/24 s/p ORIF for repeat evaluation of left forearm status post fasciotomies possible debridement of nonviable necrotic muscle, possible wound closure today by orthopedics.  FOB positive, creatinine up 1/25 pain well controlled attempting to ambulate with PT later today if possible -has been difficult given her walker bound status with left arm fracture 1/26 ortho to re-evaluate left arm compartment syndrome (distal aspect) per documentation; external fixation remains intact awaiting ORIF per their expertise  Assessment & Plan:   Left humeral fracture- (present on admission) S/p fasciotomy Lt forearm, external fixation Lt humerus, wound vac application on 0/86 5/78 s/p OR for compartment closure -tolerated well Lovenox on hold, defer to orthopedics PT OT ongoing, likely disposition to SNF given walker bound status and inability to use walker given left arm fracture (currently nonweightbearing per Ortho).   Traumatic rhabdomyolysis ([place])- (present on admission) improving AKI, decreased urine output Concurrent hyperkalemia -Ck trending down appropriately -Continue low rate IV fluids given poor p.o. intake perioperatively today -Creatinine continues to trend upwards, urine output low overnight but improving this morning -Potassium minimally elevated, should correct with increased urine output   Acute blood loss anemia due to surgery Chronic  anemia, iron deficiency - Hemoglobin 6.0, status post 2 units repeat hemoglobin stabilizing 9.7 - Iron panel with low iron.  - Fobt positive -follow clinically -Previous discussion with son unsure if patient has had colonoscopy in the past - he has opted to hold off on calling GI unless hemoglobin continues to drop.   Leukocytosis- (present on admission) Likely stress-induced/reactive Improving   Essential hypertension- (present on admission) BP stable continue to monitor   Alzheimer disease ([place])- (present on admission) At baseline, delirium precautions    Thoracic ascending aortic aneurysm- (present on admission) Noted and follows outpatient.    Hyperlipidemia Holding statin due to rhabdomyolysis   Hyponatremia Mild, chronic, no indication for treatment   DVT prophylaxis: scd, hold lovenox due to acute blood loss Code Status:full Family Communication: son updated previously  Status is: Inpatient  Dispo: The patient is from: Home              Anticipated d/c is to: SNF              Anticipated d/c date is: 72+ hours              Patient currently not medically stable for discharge  Consultants:  Orthopedic surgery  Procedures:  External fixation left humerus Compartment syndrome evaluation and debridement of distal L arm ORIF pending  Antimicrobials:  None  Subjective: No acute issues or events overnight, pain well controlled  Objective: Vitals:   07/30/21 0846 07/30/21 1350 07/30/21 1645 07/30/21 1949  BP: 133/65 126/61 124/61 (!) 123/57  Pulse: 94 (!) 59 62 91  Resp: 16 18 18    Temp: 98.3 F (36.8 C) 98.5 F (36.9 C) 98.6 F (37 C) 99.3 F (37.4 C)  TempSrc: Oral Oral Oral Oral  SpO2: 94% 90% 92% 92%  Weight:      Height:        Intake/Output Summary (Last 24 hours) at 07/31/2021 0728 Last data filed at 07/31/2021 8099 Gross per 24 hour  Intake 4359 ml  Output 2075 ml  Net 2284 ml    Filed Weights   07/29/21 0004 07/29/21 0754  Weight: 80  kg 80 kg    Examination:  General:  Pleasantly resting in bed, No acute distress. HEENT:  Normocephalic atraumatic.  Sclerae nonicteric, noninjected.  Extraocular movements intact bilaterally. Neck:  Without mass or deformity.  Trachea is midline. Lungs:  Clear to auscultate bilaterally without rhonchi, wheeze, or rales. Heart:  Regular rate and rhythm.  Without murmurs, rubs, or gallops. Abdomen:  Soft, nontender, nondistended.  Without guarding or rebound. Extremities: LUE external fixation intact - distal bandage clean/dry   Data Reviewed: I have personally reviewed following labs and imaging studies  CBC: Recent Labs  Lab 07/27/21 1237 07/28/21 0049 07/28/21 1920 07/29/21 0306 07/30/21 0218 07/30/21 1451 07/31/21 0432  WBC 20.5* 12.3* 13.9* 13.0* 10.2  --  8.8  NEUTROABS 18.0*  --   --   --   --   --   --   HGB 7.6* 6.0* 9.7* 8.9* 7.1* 6.8* 7.6*  HCT 24.4* 20.5* 30.6* 28.4* 23.0* 21.7* 23.4*  MCV 100.8* 103.5* 90.8 90.2 90.9  --  89.7  PLT 523* 354 351 340 332  --  833    Basic Metabolic Panel: Recent Labs  Lab 07/27/21 1237 07/28/21 0049 07/29/21 0306 07/30/21 0218 07/31/21 0432  NA 134* 136 132* 133* 132*  K 4.6 5.0 5.1 5.3* 4.8  CL 106 110 104 103 105  CO2 20* 19* 18* 16* 19*  GLUCOSE 155* 146* 127* 112* 107*  BUN 30* 29* 44* 50* 58*  CREATININE 1.10* 1.14* 2.53* 2.85* 2.91*  CALCIUM 8.2* 7.2* 7.1* 7.5* 7.6*  MG  --  2.2  --   --   --   PHOS  --  5.9*  --   --   --     GFR: Estimated Creatinine Clearance: 14.4 mL/min (A) (by C-G formula based on SCr of 2.91 mg/dL (H)). Liver Function Tests: No results for input(s): AST, ALT, ALKPHOS, BILITOT, PROT, ALBUMIN in the last 168 hours. No results for input(s): LIPASE, AMYLASE in the last 168 hours. No results for input(s): AMMONIA in the last 168 hours. Coagulation Profile: No results for input(s): INR, PROTIME in the last 168 hours. Cardiac Enzymes: Recent Labs  Lab 07/27/21 1237 07/28/21 0049  07/29/21 0306 07/30/21 0218 07/31/21 0432  CKTOTAL 23,294* 39,443* 8,090* 4,252* 2,840*    BNP (last 3 results) No results for input(s): PROBNP in the last 8760 hours. HbA1C: No results for input(s): HGBA1C in the last 72 hours. CBG: No results for input(s): GLUCAP in the last 168 hours. Lipid Profile: No results for input(s): CHOL, HDL, LDLCALC, TRIG, CHOLHDL, LDLDIRECT in the last 72 hours. Thyroid Function Tests: No results for input(s): TSH, T4TOTAL, FREET4, T3FREE, THYROIDAB in the last 72 hours. Anemia Panel: No results for input(s): VITAMINB12, FOLATE, FERRITIN, TIBC, IRON, RETICCTPCT in the last 72 hours.  Sepsis Labs: No results for input(s): PROCALCITON, LATICACIDVEN in the last 168 hours.  Recent Results (from the past 240 hour(s))  Resp Panel by RT-PCR (Flu A&B, Covid) Nasopharyngeal Swab     Status: None   Collection Time: 07/27/21 11:49 AM   Specimen: Nasopharyngeal Swab; Nasopharyngeal(NP) swabs in vial transport medium  Result Value Ref Range Status  SARS Coronavirus 2 by RT PCR NEGATIVE NEGATIVE Final    Comment: (NOTE) SARS-CoV-2 target nucleic acids are NOT DETECTED.  The SARS-CoV-2 RNA is generally detectable in upper respiratory specimens during the acute phase of infection. The lowest concentration of SARS-CoV-2 viral copies this assay can detect is 138 copies/mL. A negative result does not preclude SARS-Cov-2 infection and should not be used as the sole basis for treatment or other patient management decisions. A negative result may occur with  improper specimen collection/handling, submission of specimen other than nasopharyngeal swab, presence of viral mutation(s) within the areas targeted by this assay, and inadequate number of viral copies(<138 copies/mL). A negative result must be combined with clinical observations, patient history, and epidemiological information. The expected result is Negative.  Fact Sheet for Patients:   EntrepreneurPulse.com.au  Fact Sheet for Healthcare Providers:  IncredibleEmployment.be  This test is no t yet approved or cleared by the [place] FDA and  has been authorized for detection and/or diagnosis of SARS-CoV-2 by FDA under an Emergency Use Authorization (EUA). This EUA will remain  in effect (meaning this test can be used) for the duration of the COVID-19 declaration under Section 564(b)(1) of the Act, 21 U.S.C.section 360bbb-3(b)(1), unless the authorization is terminated  or revoked sooner.       Influenza A by PCR NEGATIVE NEGATIVE Final   Influenza B by PCR NEGATIVE NEGATIVE Final    Comment: (NOTE) The Xpert Xpress SARS-CoV-2/FLU/RSV plus assay is intended as an aid in the diagnosis of influenza from Nasopharyngeal swab specimens and should not be used as a sole basis for treatment. Nasal washings and aspirates are unacceptable for Xpert Xpress SARS-CoV-2/FLU/RSV testing.  Fact Sheet for Patients: EntrepreneurPulse.com.au  Fact Sheet for Healthcare Providers: IncredibleEmployment.be  This test is not yet approved or cleared by the [place] FDA and has been authorized for detection and/or diagnosis of SARS-CoV-2 by FDA under an Emergency Use Authorization (EUA). This EUA will remain in effect (meaning this test can be used) for the duration of the COVID-19 declaration under Section 564(b)(1) of the Act, 21 U.S.C. section 360bbb-3(b)(1), unless the authorization is terminated or revoked.  Performed at [facility], [place] [address], [place], [place] [address]           Radiology Studies: US RENAL  Result Date: 07/29/2021 CLINICAL DATA:  Renal dysfunction EXAM: RENAL / URINARY TRACT ULTRASOUND COMPLETE COMPARISON:  None. FINDINGS: Right Kidney: Renal measurements: 9 x 5.8 x 5 cm = volume: 137.7 mL. There is no hydronephrosis. Cortical echogenicity is unremarkable.  Evaluation is technically difficult due to patient's body habitus. Left Kidney: Renal measurements: 11.2 x 6.3 x 5.3 cm = volume: 193.4 mL. Evaluation is technically limited. As far as seen, there is no hydronephrosis or perinephric fluid collection. Cortical echogenicity is unremarkable. Bladder: Urinary bladder is empty and not evaluated. Other: None. IMPRESSION: There is no hydronephrosis. Electronically Signed   By: Elmer Picker M.D.   On: 07/29/2021 18:18    Scheduled Meds:  sodium chloride   Intravenous Once   acetaminophen  650 mg Oral Q8H   Chlorhexidine Gluconate Cloth  6 each Topical Daily   docusate sodium  100 mg Oral BID   enoxaparin  30 mg Subcutaneous Q24H   mouth rinse  15 mL Mouth Rinse BID   senna  1 tablet Oral BID   sodium zirconium cyclosilicate  10 g Oral Once   Continuous Infusions:  sodium chloride 100 mL/hr at 07/31/21 [phone]  sodium chloride      LOS: 4 days   Time spent: 85min  Erionna Strum C Dicy Smigel, DO [facility]  If 7PM-7AM, please contact night-coverage [email or web address]  07/31/2021, 7:28 AM
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
## 2021-07-31 NOTE — Progress Notes (Addendum)
Occupational Therapy Treatment Patient Details Name: [name] MRN: [id number] DOB: [birth year] Today's Date: 07/31/2021   History of present illness Patient is a 81 y/o female who presents on 07/27/21 with left arm trauma after getting LUE stuck in a dresser. Found to have left humerus fx and rhabdomyolosis. s/p fasciotomy, external fixator and wound vac placement 07/27/21. PMH includes dementia, osteoporosis, anxiety, depression, HTN, skin ca, HTN.   OT comments  Attempts made to increase mobility OOB, however pt incontinent of BM and session incorporated bed mobility while cleaning pt. Max A +2 for rolling. Pt requires step by step instructions and able to assist minimally. Pt left in chair position. Pillows used to support LUE when rolling. L axilla cleaned and barrier cream placed. Pillow case positioned to help wick away moisture. Pt may benefit from Interdry in this area.  LUE A/AA/PROM as tolerated. No active movement of hand observed. Pt states it feels "numb". Splint donned. Son present  Pt to wear L hand splint 2 hrs on/2 hrs off. KEEP LUE ELEVATED AT ALL TIMES WITH HAND ABOVE ELBOW TO DECREASE EDEMA. KEEP YELLOW FOAM BETWEEN EXT FIX AND CHEST TO REDUCE RISK OF SKIN BREAKDOWN. Continue to recommend rehab at [facility].   Recommendations for follow up therapy are one component of a multi-disciplinary discharge planning process, led by the attending physician.  Recommendations may be updated based on patient status, additional functional criteria and insurance authorization.    Follow Up Recommendations  Skilled nursing-short term rehab (<3 hours/day)    Assistance Recommended at Discharge Frequent or constant Supervision/Assistance  Patient can return home with the following      Equipment Recommendations  BSC/3in1    Recommendations for Other Services      Precautions / Restrictions Precautions Precautions: Fall Precaution Comments: A/AA/PROM ROM as tolerated LUE; multiple blisters  BLE adn L UE Required Braces or Orthoses: Other Brace Other Brace: external fixator Restrictions LUE Weight Bearing: Non weight bearing       Mobility Bed Mobility Overal bed mobility: Needs Assistance   Rolling: Max assist, +2 for physical assistance         General bed mobility comments: rolling to clean after incontinent episode then positioned in chair position    Transfers                   General transfer comment: attempt was made to use Maximove, however pt incontinent in addition to being concerned over maximove straps causing blisters on upper legs to rupture     Balance                                           ADL either performed or assessed with clinical judgement   ADL       Grooming: Moderate assistance       Lower Body Bathing: Maximal assistance;Bed level               Toileting- Clothing Manipulation and Hygiene: Total assistance (incontinent)       Functional mobility during ADLs: Maximal assistance;+2 for physical assistance General ADL Comments: Max A +2 for rolling adn repositioning in bed    Extremity/Trunk Assessment Upper Extremity Assessment Upper Extremity Assessment: LUE deficits/detail LUE Deficits / Details: moving shoulder and elbow as tolerated by Ext Fix, edema and  wound vac; no active movement of hand/wrist/ PROM grossly WFL however limited by  signficant swelling;  Decreased sensation throughout LUE below elbow; Forearm with bandage/ace wrap rewrapped due to thin peice of ace wrap through webspace with increased risk of causing skin damage   Lower Extremity Assessment Lower Extremity Assessment: Defer to PT evaluation (blisters)        Vision       Perception     Praxis      Cognition Arousal/Alertness: Awake/alert Behavior During Therapy: WFL for tasks assessed/performed (anxious at times) Overall Cognitive Status: Impaired/Different from baseline                                  General Comments: baseline dementia; most likely close to baseline        Exercises General Exercises - Upper Extremity Shoulder Flexion: AAROM, Left, 5 reps, Supine Elbow Flexion: AAROM, Left, 10 reps Elbow Extension: AAROM, Left, 10 reps Wrist Flexion: PROM, Left, 10 reps Wrist Extension: PROM, Left, 10 reps Digit Composite Flexion: PROM, Left, 10 reps Composite Extension: PROM, Left, 10 reps Other Exercises Other Exercises: Son present and stated his Mom's hand was in a dependent position on entry with increased swelling of L hand. He completed exercises as taught and reports swelling improved after elevation and ROM    Shoulder Instructions       General Comments Laxilla cleaned; barrier cream applied; pillow case placed to wick away moisture and decrease MASD    Pertinent Vitals/ Pain       Pain Assessment Pain Assessment: Faces Faces Pain Scale: Hurts even more Pain Location: L shoulder and hip Pain Descriptors / Indicators: Grimacing, Guarding, Operative site guarding, Moaning Pain Intervention(s): Patient requesting pain meds-RN notified, Repositioned  Home Living                                          Prior Functioning/Environment              Frequency  Min 3X/week        Progress Toward Goals  OT Goals(current goals can now be found in the care plan section)  Progress towards OT goals: Progressing toward goals  Acute Rehab OT Goals Patient Stated Goal: to get better OT Goal Formulation: With family Time For Goal Achievement: 08/11/21 Potential to Achieve Goals: Good ADL Goals Pt Will Perform Grooming: with min assist;sitting Pt Will Perform Upper Body Dressing: with min assist;sitting Pt Will Transfer to Toilet: with mod assist;with +2 assist;stand pivot transfer Pt/caregiver will Perform Home Exercise Program: Left upper extremity;Increased ROM;With written HEP provided;With minimal assist Additional ADL Goal  #1: Pt will tolerate L resting hadn splint for 2 hour intervals to reduce risk of contracture formation Additional ADL Goal #2: Family will demonstrate appropirate positioning to decrease dependent edema L hand/forearm  Plan Discharge plan remains appropriate    Co-evaluation    PT/OT/SLP Co-Evaluation/Treatment: Yes Reason for Co-Treatment: Complexity of the patient's impairments (multi-system involvement);For patient/therapist safety;To address functional/ADL transfers   OT goals addressed during session: ADL's and self-care;Strengthening/ROM      AM-PAC OT "6 Clicks" Daily Activity     Outcome Measure   Help from another person eating meals?: A Little Help from another person taking care of personal grooming?: A Lot Help from another person toileting, which includes using toliet, bedpan, or urinal?: Total Help from another person bathing (including washing, rinsing,  drying)?: A Lot Help from another person to put on and taking off regular upper body clothing?: A Lot Help from another person to put on and taking off regular lower body clothing?: Total 6 Click Score: 11    End of Session    OT Visit Diagnosis: Unsteadiness on feet (R26.81);Pain;Other abnormalities of gait and mobility (R26.89);Muscle weakness (generalized) (M62.81);Other symptoms and signs involving cognitive function Pain - Right/Left: Left Pain - part of body: Shoulder   Activity Tolerance Patient tolerated treatment well   Patient Left in bed;with call bell/phone within reach;with bed alarm set (chair position)   Nurse Communication Mobility status;Patient requests pain meds;Weight bearing status;Precautions;Other (comment) (splint schedule)        Time: 3496-1164 OT Time Calculation (min): 44 min  Charges: OT General Charges $OT Visit: 1 Visit OT Treatments $Self Care/Home Management : 8-22 mins $Therapeutic Activity: 8-22 mins  Maurie Boettcher, OT/L   Acute OT Clinical Specialist Acute  Rehabilitation Services Pager [phone] Office [phone]   [facility] 07/31/2021, 2:48 PM

## 2021-07-31 NOTE — Plan of Care (Signed)

## 2021-07-31 NOTE — Progress Notes (Signed)
Physical Therapy Treatment Patient Details Name: [name] MRN: [id number] DOB: 1940-12-02 Today's Date: 07/31/2021   History of Present Illness Patient is a 81 y/o female who presents on 07/27/21 with left arm trauma after getting LUE stuck in a dresser. Found to have left humerus fx and rhabdomyolosis. s/p fasciotomy, external fixator and wound vac placement 07/27/21. PMH includes dementia, osteoporosis, anxiety, depression, HTN, skin ca, HTN.    PT Comments    Pt was seen with a visit alongside OT due to pt's considerable limitations of LUE ROM and fixators, along with significant pain of OA and spurs in joints esp L hip.  Pt is mildly confused but agreeable to try to move. Her plan to get up to chair was hindered by incontinent episode but also has a great deal of blistering inside thighs, and the use of the lift sling may be difficult.  Will focus on sitting balance and work toward getting pt to the chair, along with managing the limits of joint stiffness from this disuse.  Follow along with acute PT goals to work toward SNF care given her decline of independence that will require a longer time frame to recover toward going home.   Recommendations for follow up therapy are one component of a multi-disciplinary discharge planning process, led by the attending physician.  Recommendations may be updated based on patient status, additional functional criteria and insurance authorization.  Follow Up Recommendations  Skilled nursing-short term rehab (<3 hours/day)     Assistance Recommended at Discharge Frequent or constant Supervision/Assistance  Patient can return home with the following Two people to help with walking and/or transfers;Two people to help with bathing/dressing/bathroom;Assistance with cooking/housework;Assistance with feeding;Direct supervision/assist for medications management;Assist for transportation;Help with stairs or ramp for entrance   Equipment Recommendations  Other  (comment) (TBD at SNF)    Recommendations for Other Services Rehab consult     Precautions / Restrictions Precautions Precautions: Fall Precaution Comments: A/AA/PROM ROM as tolerated LUE; multiple blisters BLE adn L UE Required Braces or Orthoses: Other Brace Other Brace: external fixator Restrictions LUE Weight Bearing: Non weight bearing     Mobility  Bed Mobility Overal bed mobility: Needs Assistance Bed Mobility: Rolling Rolling: Max assist, +2 for physical assistance, +2 for safety/equipment         General bed mobility comments: Two therapists needed due to L  hip OA with severes spurring and L should er ROM restrictions along with skin blisters    Transfers                   General transfer comment: unable to use the lift due to pt incontinence and skin concerns with blisters    Ambulation/Gait                   Stairs             Wheelchair Mobility    Modified Rankin (Stroke Patients Only)       Balance                                            Cognition Arousal/Alertness: Awake/alert Behavior During Therapy: WFL for tasks assessed/performed Overall Cognitive Status: Impaired/Different from baseline  General Comments: dementia at baseline        Exercises General Exercises - Lower Extremity Heel Slides: AAROM, 10 reps Other Exercises Other Exercises: AAROM to ankles Other Exercises: AAROM to hip abd/add Other Exercises: assisted ROM to rotate hips    General Comments General comments (skin integrity, edema, etc.): skin care for L UE axilla and peri area, concerns for blisters and skin maceration      Pertinent Vitals/Pain Pain Assessment Pain Assessment: Faces Faces Pain Scale: Hurts even more Pain Location: L shoulder and hip Pain Descriptors / Indicators: Guarding, Grimacing, Aching Pain Intervention(s): Limited activity within patient's  tolerance, Patient requesting pain meds-RN notified, Repositioned    Home Living                          Prior Function            PT Goals (current goals can now be found in the care plan section) Progress towards PT goals: Progressing toward goals    Frequency    Min 2X/week      PT Plan Current plan remains appropriate    Co-evaluation PT/OT/SLP Co-Evaluation/Treatment: Yes Reason for Co-Treatment: Complexity of the patient's impairments (multi-system involvement);For patient/therapist safety PT goals addressed during session: Mobility/safety with mobility;Strengthening/ROM        AM-PAC PT "6 Clicks" Mobility   Outcome Measure  Help needed turning from your back to your side while in a flat bed without using bedrails?: Total Help needed moving from lying on your back to sitting on the side of a flat bed without using bedrails?: Total Help needed moving to and from a bed to a chair (including a wheelchair)?: Total Help needed standing up from a chair using your arms (e.g., wheelchair or bedside chair)?: Total Help needed to walk in hospital room?: Total Help needed climbing 3-5 steps with a railing? : Total 6 Click Score: 6    End of Session Equipment Utilized During Treatment: Oxygen Activity Tolerance: Patient limited by pain Patient left: in bed;with call bell/phone within reach;with nursing/sitter in room;with bed alarm set;with family/visitor present Nurse Communication: Mobility status;Need for lift equipment;Other (comment) (concerns about skin changes) PT Visit Diagnosis: Pain;Muscle weakness (generalized) (M62.81);Other abnormalities of gait and mobility (R26.89) Pain - Right/Left: Left Pain - part of body: Shoulder;Arm;Hand;Hip;Knee     Time: 0321-2248 PT Time Calculation (min) (ACUTE ONLY): 30 min  Charges:  $Therapeutic Activity: 8-22 mins       Ramond Dial 07/31/2021, 7:33 PM  Mee Hives, PT PhD Acute Rehab Dept. Number: [place]

## 2021-07-31 NOTE — Brief Op Note (Signed)
07/29/2021  3:10 PM  PATIENT:  [name]  81 y.o. female  PRE-OPERATIVE DIAGNOSIS:   Left arm compartment syndrome EXTERNAL FIXATOR LEFT HUMERUS  POST-OPERATIVE DIAGNOSIS:   Left arm compartment syndrome EXTERNAL FIXATOR LEFT HUMERUS  PROCEDURE:  Procedure(s): IRRIGATION AND DEBRIDEMENT EXTREMITY (Left) WITH EXCISION OF NECROTIC MUSCLE SUPERFICIAL AND DEEP VOLAR COMPARTMENTS AND MOBILE WAD RETENTION SUTURE CLOSURE 38 CM DRESSING CHANGE UNDER ANESTHESIA  SURGEON:  Surgeon(s) and Role:    [place] Gaffney, MD - Primary  PHYSICIAN ASSISTANT: Ainsley Spinner, PA-C  ASSISTANTS: Ainsley Spinner, PA-C   ANESTHESIA:   general  EBL:  150 mL   BLOOD ADMINISTERED:none  DRAINS:  AS ABOVE    LOCAL MEDICATIONS USED:  NONE  SPECIMEN:  No Specimen  DISPOSITION OF SPECIMEN:  N/A  COUNTS:  YES  TOURNIQUET:  * Missing tourniquet times found for documented tourniquets in log: [id number] *  DICTATION: .Other Dictation: Dictation Number [id number]  PLAN OF CARE: Admit to inpatient   PATIENT DISPOSITION:  PACU - hemodynamically stable.   Delay start of Pharmacological VTE agent (>24hrs) due to surgical blood loss or risk of bleeding: no

## 2021-08-01 LAB — CBC
HCT: 24.2 % — ABNORMAL LOW (ref 36.0–46.0)
Hemoglobin: 7.8 g/dL — ABNORMAL LOW (ref 12.0–15.0)
MCH: 29.7 pg (ref 26.0–34.0)
MCHC: 32.2 g/dL (ref 30.0–36.0)
MCV: 92 fL (ref 80.0–100.0)
Platelets: 325 10*3/uL (ref 150–400)
RBC: 2.63 MIL/uL — ABNORMAL LOW (ref 3.87–5.11)
RDW: 19.6 % — ABNORMAL HIGH (ref 11.5–15.5)
WBC: 8.1 10*3/uL (ref 4.0–10.5)
nRBC: 1.2 % — ABNORMAL HIGH (ref 0.0–0.2)

## 2021-08-01 LAB — BASIC METABOLIC PANEL
Anion gap: 8 (ref 5–15)
BUN: 59 mg/dL — ABNORMAL HIGH (ref 8–23)
CO2: 21 mmol/L — ABNORMAL LOW (ref 22–32)
Calcium: 7.8 mg/dL — ABNORMAL LOW (ref 8.9–10.3)
Chloride: 102 mmol/L (ref 98–111)
Creatinine, Ser: 2.71 mg/dL — ABNORMAL HIGH (ref 0.44–1.00)
GFR, Estimated: 17 mL/min — ABNORMAL LOW (ref >60)
Glucose, Bld: 111 mg/dL — ABNORMAL HIGH (ref 70–99)
Potassium: 4.7 mmol/L (ref 3.5–5.1)
Sodium: 131 mmol/L — ABNORMAL LOW (ref 135–145)

## 2021-08-01 LAB — CK: Total CK: 2016 U/L — ABNORMAL HIGH (ref 38–234)

## 2021-08-01 NOTE — Progress Notes (Signed)
Patient had 1 small liquid black stool overnight. Will continue to monitor.

## 2021-08-01 NOTE — Progress Notes (Signed)
Occupational Therapy Treatment Patient Details Name: [name] MRN: [id number] DOB: 1941/05/07 Today's Date: 08/01/2021   History of present illness Patient is a 81 y/o female who presents on 07/27/21 with left arm trauma after getting LUE stuck in a dresser. Found to have left humerus fx and rhabdomyolosis. s/p fasciotomy, external fixator and wound vac placement 07/27/21. PMH includes dementia, osteoporosis, anxiety, depression, HTN, skin ca, HTN.   OT comments  Focus of session on LUE ROM, edema control and positioning. Assisted nsg with bed mobility, linen change and education on how to move pt for bathing to maintain NWB status and reduce pain. Pillow case placed between chest and Upper arm to reduce MASD. Discussed with nsg and recommend use of Interdry. Pt with increased productive cough during session. Son present and educated on importance of use of incentive spirometer. Son given written HEP [place] program for L hand/wrist and elbow ROM. Acute Ot to continue to follow.    Recommendations for follow up therapy are one component of a multi-disciplinary discharge planning process, led by the attending physician.  Recommendations may be updated based on patient status, additional functional criteria and insurance authorization.    Follow Up Recommendations  Skilled nursing-short term rehab (<3 hours/day)    Assistance Recommended at Discharge Frequent or constant Supervision/Assistance  Patient can return home with the following      Equipment Recommendations  BSC/3in1    Recommendations for Other Services      Precautions / Restrictions Precautions Precautions: Fall Precaution Comments: A/AA/PROM ROM as tolerated LUE; multiple blisters BLE adn L UE Required Braces or Orthoses: Other Brace Other Brace: external fixator Restrictions Weight Bearing Restrictions: Yes LUE Weight Bearing: Non weight bearing       Mobility Bed Mobility Overal bed mobility: Needs  Assistance Bed Mobility: Rolling Rolling: Max assist, +2 for physical assistance              Transfers                   General transfer comment: not attempted     Balance                                           ADL either performed or assessed with clinical judgement   ADL                                              Extremity/Trunk Assessment Upper Extremity Assessment Upper Extremity Assessment: LUE deficits/detail;RUE deficits/detail RUE Deficits / Details: generalized weakness LUE Deficits / Details: moving shoulder and elbow as tolerated by Ext Fix, edema and  wound vac; no active movement of hand/wrist/ PROM grossly [facility] however limited by signficant swelling;  Decreased sensation throughout LUE below elbow; Forearm with bandage/ace wrap rewrapped due to loose bandage. LUE Coordination: decreased fine motor;decreased gross motor If LUE elevation is difficult to achieve, may benefit from use of arm elevator   Lower Extremity Assessment Lower Extremity Assessment: Defer to PT evaluation        Vision       Perception     Praxis      Cognition Arousal/Alertness: Awake/alert Behavior During Therapy: [facility] for tasks assessed/performed  General Comments: dementia at baseline        Exercises General Exercises - Upper Extremity Shoulder Flexion: AAROM, Left, 5 reps, Supine Elbow Flexion: AAROM, Left, 10 reps Elbow Extension: AAROM, Left, 10 reps Wrist Flexion: PROM, Left, 10 reps Wrist Extension: PROM, Left, 10 reps Digit Composite Flexion: PROM, Left, 10 reps Composite Extension: PROM, Left, 10 reps Chair Push Up: PROM Other Exercises Other Exercises: splint check - tolerating well    Shoulder Instructions       General Comments Primary focus of session on LUE ROM, edema control and splint check, however nsg needing help with bed mobility and management  of LUE. Pillow placed on abdomen to support arm and decrease ext fix pushing into chest. Bed pad used to roll pt in addition to pt pushing with her LLE to prevent pulling on LUE. Assisted wtih lifting LUE while NT cleaned area and positioned pillow case to help wick away moisture. Asked Nsg to order Interdry    Pertinent Vitals/ Pain       Pain Assessment Pain Assessment: Faces Faces Pain Scale: Hurts even more Pain Location: L shoulder and hip Pain Descriptors / Indicators: Guarding, Grimacing, Aching Pain Intervention(s): Limited activity within patient's tolerance  Home Living                                          Prior Functioning/Environment              Frequency  Min 3X/week        Progress Toward Goals  OT Goals(current goals can now be found in the care plan section)  Progress towards OT goals: Progressing toward goals  Acute Rehab OT Goals Patient Stated Goal: to get better OT Goal Formulation: With family Time For Goal Achievement: 08/11/21 Potential to Achieve Goals: Good ADL Goals Pt Will Perform Grooming: with min assist;sitting Pt Will Perform Upper Body Dressing: with min assist;sitting Pt Will Transfer to Toilet: with mod assist;with +2 assist;stand pivot transfer Pt/caregiver will Perform Home Exercise Program: Left upper extremity;Increased ROM;With written HEP provided;With minimal assist Additional ADL Goal #1: Pt will tolerate L resting hadn splint for 2 hour intervals to reduce risk of contracture formation Additional ADL Goal #2: Family will demonstrate appropirate positioning to decrease dependent edema L hand/forearm  Plan Discharge plan remains appropriate    Co-evaluation                 AM-PAC OT "6 Clicks" Daily Activity     Outcome Measure   Help from another person eating meals?: A Little Help from another person taking care of personal grooming?: A Lot Help from another person toileting, which includes  using toliet, bedpan, or urinal?: Total Help from another person bathing (including washing, rinsing, drying)?: A Lot Help from another person to put on and taking off regular upper body clothing?: A Lot Help from another person to put on and taking off regular lower body clothing?: Total 6 Click Score: 11    End of Session    OT Visit Diagnosis: Unsteadiness on feet (R26.81);Pain;Other abnormalities of gait and mobility (R26.89);Muscle weakness (generalized) (M62.81);Other symptoms and signs involving cognitive function Pain - Right/Left: Left Pain - part of body: Shoulder;Hip   Activity Tolerance Patient tolerated treatment well   Patient Left in bed;with call bell/phone within reach;with bed alarm set   Nurse Communication Mobility status;Patient requests pain  meds;Weight bearing status;Precautions;Other (comment)        Time: 1417-1500 OT Time Calculation (min): 43 min  Charges: OT General Charges $OT Visit: 1 Visit OT Treatments $Self Care/Home Management : 8-22 mins $Therapeutic Activity: 8-22 mins $Therapeutic Exercise: 8-22 mins  Maurie Boettcher, OT/L   Acute OT Clinical Specialist [place] Pager [phone] Office [phone]   [facility] 08/01/2021, 3:38 PM

## 2021-08-01 NOTE — Progress Notes (Addendum)
PROGRESS NOTE    [name]  TKZ:[id number] DOB: 04/29/1941 DOA: 07/27/2021 PCP: Orpah Melter, MD   Brief Narrative:  [name] is a 81 y.o. female with medical history significant of HTN, alzheimers dementia, anemia,  anxiety and depression who presents to ED after fall with subsequent left arm trauma. History is from her son due to her dementia. Ortho consulted given compartment syndrome/humerus fracture on L side.  1/23 status post surgery on 07/27/2021.  Hemoglobin 6.0, getting 2 units of packed red blood cell transfusion this a.m. 1/24 s/p ORIF for repeat evaluation of left forearm status post fasciotomies possible debridement of nonviable necrotic muscle, possible wound closure today by orthopedics.  FOB positive, creatinine up 1/25 pain well controlled attempting to ambulate with PT later today if possible -has been difficult given her walker bound status with left arm fracture 1/26 ortho to re-evaluate left arm compartment syndrome 1/27 external fixation remains intact awaiting ORIF per their expertise likely 1/30-31 pending resolution of distal edema/swelling.  Assessment & Plan:   Left humeral fracture- (present on admission) S/p fasciotomy Lt forearm, external fixation Lt humerus, wound vac application on 5/73 2/20 s/p OR for compartment closure -tolerated well Lovenox per orthopedics PT/OT ongoing, likely disposition to SNF given walker bound status and inability to use walker given left arm fracture (currently nonweightbearing per Ortho).   Traumatic rhabdomyolysis ([place])- (present on admission) improving AKI, decreased urine output Concurrent hyperkalemia -Ck trending down appropriately -Continue low rate IV fluids given poor p.o. intake perioperatively today -Creatinine continues to trend upwards, urine output low overnight but improving this morning -Potassium minimally elevated, should correct with increased urine output   Acute blood loss anemia due to  surgery Chronic anemia, iron deficiency Questionable GI bleed - Hemoglobin continues to drop slowly in the setting of above - Wound vac continues to put out blood tinged fluid - Fobt positive -follow clinically - GI consulted given ongoing drop in Hgb per son's wishes but patient and son are not agreeable for endoscopy - Hold lovenox   Essential hypertension- (present on admission) BP stable continue to monitor   Alzheimer disease ([place])- (present on admission) At baseline, delirium precautions    Thoracic ascending aortic aneurysm- (present on admission) Noted and follows outpatient.    Hyperlipidemia Holding statin due to rhabdomyolysis   Hyponatremia Mild, chronic, no indication for treatment   DVT prophylaxis: scd, hold lovenox due to ongoing anemia and questionable blood loss Code Status:full Family Communication: son updated at bedside  Status is: Inpatient  Dispo: The patient is from: Home              Anticipated d/c is to: SNF              Anticipated d/c date is: 72+ hours              Patient currently not medically stable for discharge  Consultants:  Orthopedic surgery  Procedures:  External fixation left humerus Compartment syndrome evaluation and debridement of distal L arm ORIF pending  Antimicrobials:  None  Subjective: No acute issues or events overnight, pain well controlled  Objective: Vitals:   07/30/21 1949 07/31/21 1128 07/31/21 2037 08/01/21 0441  BP: (!) 123/57 123/64 124/63 (!) 127/101  Pulse: 91 87 86 88  Resp:  17 15 16   Temp: 99.3 F (37.4 C) 98.2 F (36.8 C) 98.3 F (36.8 C) 97.8 F (36.6 C)  TempSrc: Oral Oral Oral Oral  SpO2: 92% 94% 93% 98%  Weight:  Height:        Intake/Output Summary (Last 24 hours) at 08/01/2021 0738 Last data filed at 08/01/2021 0645 Gross per 24 hour  Intake 472.58 ml  Output 1900 ml  Net -1427.42 ml    Filed Weights   07/29/21 0004 07/29/21 0754  Weight: 80 kg 80 kg     Examination:  General:  Pleasantly resting in bed, No acute distress. HEENT:  Normocephalic atraumatic.  Sclerae nonicteric, noninjected.  Extraocular movements intact bilaterally. Neck:  Without mass or deformity.  Trachea is midline. Lungs:  Clear to auscultate bilaterally without rhonchi, wheeze, or rales. Heart:  Regular rate and rhythm.  Without murmurs, rubs, or gallops. Abdomen:  Soft, nontender, nondistended.  Without guarding or rebound. Extremities: LUE external fixation intact - distal bandage clean/dry   Data Reviewed: I have personally reviewed following labs and imaging studies  CBC: Recent Labs  Lab 07/27/21 1237 07/28/21 0049 07/28/21 1920 07/29/21 0306 07/30/21 0218 07/30/21 1451 07/31/21 0432  WBC 20.5* 12.3* 13.9* 13.0* 10.2  --  8.8  NEUTROABS 18.0*  --   --   --   --   --   --   HGB 7.6* 6.0* 9.7* 8.9* 7.1* 6.8* 7.6*  HCT 24.4* 20.5* 30.6* 28.4* 23.0* 21.7* 23.4*  MCV 100.8* 103.5* 90.8 90.2 90.9  --  89.7  PLT 523* 354 351 340 332  --  299    Basic Metabolic Panel: Recent Labs  Lab 07/28/21 0049 07/29/21 0306 07/30/21 0218 07/31/21 0432 08/01/21 0134  NA 136 132* 133* 132* 131*  K 5.0 5.1 5.3* 4.8 4.7  CL 110 104 103 105 102  CO2 19* 18* 16* 19* 21*  GLUCOSE 146* 127* 112* 107* 111*  BUN 29* 44* 50* 58* 59*  CREATININE 1.14* 2.53* 2.85* 2.91* 2.71*  CALCIUM 7.2* 7.1* 7.5* 7.6* 7.8*  MG 2.2  --   --   --   --   PHOS 5.9*  --   --   --   --     GFR: Estimated Creatinine Clearance: 15.5 mL/min (A) (by C-G formula based on SCr of 2.71 mg/dL (H)). Liver Function Tests: No results for input(s): AST, ALT, ALKPHOS, BILITOT, PROT, ALBUMIN in the last 168 hours. No results for input(s): LIPASE, AMYLASE in the last 168 hours. No results for input(s): AMMONIA in the last 168 hours. Coagulation Profile: No results for input(s): INR, PROTIME in the last 168 hours. Cardiac Enzymes: Recent Labs  Lab 07/28/21 0049 07/29/21 0306 07/30/21 0218  07/31/21 0432 08/01/21 0134  CKTOTAL 39,443* 8,090* 4,252* 2,840* 2,016*    BNP (last 3 results) No results for input(s): PROBNP in the last 8760 hours. HbA1C: No results for input(s): HGBA1C in the last 72 hours. CBG: No results for input(s): GLUCAP in the last 168 hours. Lipid Profile: No results for input(s): CHOL, HDL, LDLCALC, TRIG, CHOLHDL, LDLDIRECT in the last 72 hours. Thyroid Function Tests: No results for input(s): TSH, T4TOTAL, FREET4, T3FREE, THYROIDAB in the last 72 hours. Anemia Panel: No results for input(s): VITAMINB12, FOLATE, FERRITIN, TIBC, IRON, RETICCTPCT in the last 72 hours.  Sepsis Labs: No results for input(s): PROCALCITON, LATICACIDVEN in the last 168 hours.  Recent Results (from the past 240 hour(s))  Resp Panel by RT-PCR (Flu A&B, Covid) Nasopharyngeal Swab     Status: None   Collection Time: 07/27/21 11:49 AM   Specimen: Nasopharyngeal Swab; Nasopharyngeal(NP) swabs in vial transport medium  Result Value Ref Range Status   SARS Coronavirus 2 by  RT PCR NEGATIVE NEGATIVE Final    Comment: (NOTE) SARS-CoV-2 target nucleic acids are NOT DETECTED.  The SARS-CoV-2 RNA is generally detectable in upper respiratory specimens during the acute phase of infection. The lowest concentration of SARS-CoV-2 viral copies this assay can detect is 138 copies/mL. A negative result does not preclude SARS-Cov-2 infection and should not be used as the sole basis for treatment or other patient management decisions. A negative result may occur with  improper specimen collection/handling, submission of specimen other than nasopharyngeal swab, presence of viral mutation(s) within the areas targeted by this assay, and inadequate number of viral copies(<138 copies/mL). A negative result must be combined with clinical observations, patient history, and epidemiological information. The expected result is Negative.  Fact Sheet for Patients:   EntrepreneurPulse.com.au  Fact Sheet for Healthcare Providers:  IncredibleEmployment.be  This test is no t yet approved or cleared by the [place] FDA and  has been authorized for detection and/or diagnosis of SARS-CoV-2 by FDA under an Emergency Use Authorization (EUA). This EUA will remain  in effect (meaning this test can be used) for the duration of the COVID-19 declaration under Section 564(b)(1) of the Act, 21 U.S.C.section 360bbb-3(b)(1), unless the authorization is terminated  or revoked sooner.       Influenza A by PCR NEGATIVE NEGATIVE Final   Influenza B by PCR NEGATIVE NEGATIVE Final    Comment: (NOTE) The Xpert Xpress SARS-CoV-2/FLU/RSV plus assay is intended as an aid in the diagnosis of influenza from Nasopharyngeal swab specimens and should not be used as a sole basis for treatment. Nasal washings and aspirates are unacceptable for Xpert Xpress SARS-CoV-2/FLU/RSV testing.  Fact Sheet for Patients: EntrepreneurPulse.com.au  Fact Sheet for Healthcare Providers: IncredibleEmployment.be  This test is not yet approved or cleared by the [place] FDA and has been authorized for detection and/or diagnosis of SARS-CoV-2 by FDA under an Emergency Use Authorization (EUA). This EUA will remain in effect (meaning this test can be used) for the duration of the COVID-19 declaration under Section 564(b)(1) of the Act, 21 U.S.C. section 360bbb-3(b)(1), unless the authorization is terminated or revoked.  Performed at [facility], [place] [address], [place], [place] [address]           Radiology Studies: No results found.  Scheduled Meds:  sodium chloride   Intravenous Once   acetaminophen  650 mg Oral Q8H   Chlorhexidine Gluconate Cloth  6 each Topical Daily   docusate sodium  100 mg Oral BID   enoxaparin  30 mg Subcutaneous Q24H   mouth rinse  15 mL Mouth Rinse BID    pantoprazole  40 mg Oral BID AC   senna  1 tablet Oral BID   sodium zirconium cyclosilicate  10 g Oral Once   Continuous Infusions:  sodium chloride 75 mL/hr at 08/01/21 0646   sodium chloride      LOS: 5 days   Time spent: 74min  [name] C Peony Barner, DO [facility]  If 7PM-7AM, please contact night-coverage [email or web address]  08/01/2021, 7:38 AM

## 2021-08-01 NOTE — Progress Notes (Signed)
[facility]th[facility]edic [facility] Service Progress Note  Patient ID: [name] MRN: [id number] DOB/AGE: 81-Oct-1942 81 y.o.  Subjective:  No acute issues Doing ok  Pain controlled   Primarily using tylenol   Occasional norco    ROS As above  Objective:   VITALS:   Vitals:   07/30/21 1949 07/31/21 1128 07/31/21 2037 08/01/21 0441  BP: (!) 123/57 123/64 124/63 (!) 127/101  Pulse: 91 87 86 88  Resp:  17 15 16   Temp: 99.3 F (37.4 C) 98.2 F (36.8 C) 98.3 F (36.8 C) 97.8 F (36.6 C)  TempSrc: Oral Oral Oral Oral  SpO2: 92% 94% 93% 98%  Weight:      Height:        Estimated body mass index is 34.44 kg/m as calculated from the following:   Height as of this encounter: 5' (1.524 m).   Weight as of this encounter: 80 kg.   Intake/Output      01/26 0701 01/27 0700 01/27 0701 01/28 0700   P.O. 240    I.V. (mL/kg) 232.6 (2.9)    Blood     Total Intake(mL/kg) 472.6 (5.9)    Urine (mL/kg/hr) 1750 (0.9)    Drains 150    Stool 0    Total Output 1900    Net -1427.4         Stool Occurrence 1 x      LABS  Results for orders placed or performed during the hospital encounter of 07/27/21 (from the past 24 hour(s))  CK     Status: Abnormal   Collection Time: 08/01/21  1:34 AM  Result Value Ref Range   Total CK 2,016 (H) 38 - 234 U/L  Basic metabolic panel     Status: Abnormal   Collection Time: 08/01/21  1:34 AM  Result Value Ref Range   Sodium 131 (L) 135 - 145 mmol/L   Potassium 4.7 3.5 - 5.1 mmol/L   Chloride 102 98 - 111 mmol/L   CO2 21 (L) 22 - 32 mmol/L   Glucose, Bld 111 (H) 70 - 99 mg/dL   BUN 59 (H) 8 - 23 mg/dL   Creatinine, Ser 2.71 (H) 0.44 - 1.00 mg/dL   Calcium 7.8 (L) 8.9 - 10.3 mg/dL   GFR, Estimated 17 (L) >60 mL/min   Anion gap 8 5 - 15  CBC     Status: Abnormal   Collection Time: 08/01/21  1:34 AM  Result Value Ref Range   WBC 8.1 4.0 - 10.5 K/uL   RBC 2.63 (L) 3.87 - 5.11  MIL/uL   Hemoglobin 7.8 (L) 12.0 - 15.0 g/dL   HCT 24.2 (L) 36.0 - 46.0 %   MCV 92.0 80.0 - 100.0 fL   MCH 29.7 26.0 - 34.0 pg   MCHC 32.2 30.0 - 36.0 g/dL   RDW 19.6 (H) 11.5 - 15.5 %   Platelets 325 150 - 400 K/uL   nRBC 1.2 (H) 0.0 - 0.2 %     PHYSICAL EXAM:   Gen: sitting up in bed, NAD, son at bedside  Lungs: unlabored  Ext:       Left Upper Extremity              Ex fix L upper arm stable  Serosanguinous drainage but stable              Dressing distal pinsites stable             Ace and wound vac intact             No motor or sensory noted to hand or wrist              Ext warm                Assessment/Plan: 3 Days Post-Op    Anti-infectives (From admission, onward)    Start     Dose/Rate Route Frequency Ordered Stop   07/30/21 0330  ceFAZolin (ANCEF) IVPB 2g/100 mL premix        2 g 200 mL/hr over 30 Minutes Intravenous Every 12 hours 07/29/21 1716 07/30/21 0354   07/29/21 1630  ceFAZolin (ANCEF) IVPB 2g/100 mL premix  Status:  Discontinued        2 g 200 mL/hr over 30 Minutes Intravenous Every 8 hours 07/29/21 1250 07/29/21 1716   07/29/21 0700  ceFAZolin (ANCEF) IVPB 2g/100 mL premix        2 g 200 mL/hr over 30 Minutes Intravenous  Once 07/28/21 2155 07/29/21 0838   07/28/21 0600  ceFAZolin (ANCEF) IVPB 2g/100 mL premix  Status:  Discontinued        2 g 200 mL/hr over 30 Minutes Intravenous On call to O.R. 07/27/21 2313 07/28/21 0012   07/28/21 0020  ceFAZolin (ANCEF) IVPB 2g/100 mL premix        2 g 200 mL/hr over 30 Minutes Intravenous Every 6 hours 07/28/21 0020 07/29/21 0029   07/27/21 2201  vancomycin (VANCOCIN) powder  Status:  Discontinued          As needed 07/27/21 2202 07/27/21 2234   07/27/21 1957  ceFAZolin (ANCEF) 2-4 GM/100ML-% IVPB       Note to Pharmacy: Ubaldo Glassing M: cabinet override      07/27/21 1957 07/28/21 1017     .  POD/HD#: 69  81 y/o female with L humeral shaft fracture and L forearm compartment syndrome     -closed L humerus fracture s/p Ex fix             NWB L uex             Definitive fixation TBD---> maybe Tuesday    - L forearm compartment syndrome likely due to prolonged external compression from mechanism of injury s/p fasciotomies and I&D             Continue with vac                         Will likely dc vac on Monday               Fasciotomies closed                Custom brace made by OT to help maintain position of function/minimize sequela of contractures                           Extensive debridement of volar forearm compartment                          Muscles of dorsal compartment noncontractile but had good color  No ROM restrictions    Aggressive passive motion of hand, wrist, elbow                - Pain management:             continue with current regimen    - ABL anemia/Hemodynamics             Monitor              cbc in am    - Medical issues              Per primary                          AKI                                      Due to rhabdo    - DVT/PE prophylaxis:             Scds             lovenox    - ID:              Periop abx completed    - Dispo:             Continue with inpatient care   Dressing change on Monday                   Jari Pigg, PA-C [phone] (C) 08/01/2021, 11:08 AM  [facility] [place] [id number] [phone] Jenetta [phone] (F)    After 5pm and on the weekends please log on to Amion, go to orthopaedics and the look under the [facility] Call for the provider(s) on call. You can also call our office at [phone] and then follow the prompts to be connected to the call team.   Patient ID: [name], female   DOB: [birth year], 81 y.o.   MRN: [id number]

## 2021-08-02 LAB — BASIC METABOLIC PANEL
Anion gap: 7 (ref 5–15)
BUN: 49 mg/dL — ABNORMAL HIGH (ref 8–23)
CO2: 21 mmol/L — ABNORMAL LOW (ref 22–32)
Calcium: 7.7 mg/dL — ABNORMAL LOW (ref 8.9–10.3)
Chloride: 107 mmol/L (ref 98–111)
Creatinine, Ser: 1.91 mg/dL — ABNORMAL HIGH (ref 0.44–1.00)
GFR, Estimated: 26 mL/min — ABNORMAL LOW (ref >60)
Glucose, Bld: 106 mg/dL — ABNORMAL HIGH (ref 70–99)
Potassium: 3.9 mmol/L (ref 3.5–5.1)
Sodium: 135 mmol/L (ref 135–145)

## 2021-08-02 LAB — CBC
HCT: 25 % — ABNORMAL LOW (ref 36.0–46.0)
Hemoglobin: 7.6 g/dL — ABNORMAL LOW (ref 12.0–15.0)
MCH: 28.3 pg (ref 26.0–34.0)
MCHC: 30.4 g/dL (ref 30.0–36.0)
MCV: 92.9 fL (ref 80.0–100.0)
Platelets: 307 10*3/uL (ref 150–400)
RBC: 2.69 MIL/uL — ABNORMAL LOW (ref 3.87–5.11)
RDW: 19.5 % — ABNORMAL HIGH (ref 11.5–15.5)
WBC: 8.5 10*3/uL (ref 4.0–10.5)
nRBC: 0.7 % — ABNORMAL HIGH (ref 0.0–0.2)

## 2021-08-02 NOTE — Progress Notes (Signed)
PROGRESS NOTE    [name]  WIO:[id number] DOB: [birth year] DOA: 07/27/2021 PCP: Orpah Melter, MD   Brief Narrative:  [name] is a 81 y.o. female with medical history significant of HTN, alzheimers dementia, anemia,  anxiety and depression who presents to ED after fall with subsequent left arm trauma. History is from her son due to her dementia. Ortho consulted given compartment syndrome/humerus fracture on L side.  1/23 status post surgery on 07/27/2021.  Hemoglobin 6.0, getting 2 units of packed red blood cell transfusion this a.m. 1/24 s/p ORIF for repeat evaluation of left forearm status post fasciotomies possible debridement of nonviable necrotic muscle, possible wound closure today by orthopedics.  FOB positive, creatinine up 1/25 pain well controlled attempting to ambulate with PT later today if possible -has been difficult given her walker bound status with left arm fracture 1/26 ortho to re-evaluate left arm compartment syndrome 1/27-1/28 external fixation remains intact awaiting ORIF per their expertise likely 1/30-31 pending resolution of distal edema/swelling.  Assessment & Plan:   Left humeral fracture- (present on admission) S/p fasciotomy Lt forearm, external fixation Lt humerus, wound vac application on 4/26 8/34 s/p OR for compartment closure -tolerated well Lovenox per orthopedics PT/OT ongoing, likely disposition to SNF given walker bound status and inability to use walker given left arm fracture (currently nonweightbearing per Ortho).   Traumatic rhabdomyolysis ([place])- (present on admission) improving AKI, decreased urine output Concurrent hyperkalemia -Ck trending down appropriately -Continue low rate IV fluids given poor p.o. intake perioperatively today -Creatinine continues to trend upwards, urine output low overnight but improving this morning -Potassium minimally elevated, should correct with increased urine output   Acute blood loss anemia due to  surgery Chronic anemia, iron deficiency Questionable GI bleed - Hemoglobin continues to drop slowly in the setting of above - Wound vac continues to put out blood tinged fluid - Fobt positive -follow clinically - GI consulted given ongoing drop in Hgb per son's wishes but patient and son are not agreeable for endoscopy - Hold lovenox   Essential hypertension- (present on admission) BP stable continue to monitor   Alzheimer disease ([place])- (present on admission) At baseline, delirium precautions    Thoracic ascending aortic aneurysm- (present on admission) Noted and follows outpatient.    Hyperlipidemia Holding statin due to rhabdomyolysis   Hyponatremia Mild, chronic, no indication for treatment   DVT prophylaxis: scd, hold lovenox due to ongoing anemia and questionable blood loss Code Status:full Family Communication: son updated at bedside  Status is: Inpatient  Dispo: The patient is from: Home              Anticipated d/c is to: SNF              Anticipated d/c date is: 72+ hours              Patient currently not medically stable for discharge  Consultants:  Orthopedic surgery  Procedures:  External fixation left humerus Compartment syndrome evaluation and debridement of distal L arm ORIF pending  Antimicrobials:  None  Subjective: No acute issues or events overnight, pain well controlled  Objective: Vitals:   08/01/21 0506 08/01/21 1525 08/01/21 2019 08/02/21 0410  BP:  (!) 149/76 (!) 141/59 132/66  Pulse:  89 94 85  Resp: (!) 24 16 18    Temp:  98.1 F (36.7 C) 98.8 F (37.1 C) 98.5 F (36.9 C)  TempSrc:  Oral Oral Oral  SpO2:  97% 97% 97%  Weight:  Height:        Intake/Output Summary (Last 24 hours) at 08/02/2021 0736 Last data filed at 08/02/2021 0500 Gross per 24 hour  Intake --  Output 1100 ml  Net -1100 ml    Filed Weights   07/29/21 0004 07/29/21 0754  Weight: 80 kg 80 kg    Examination:  General:  Pleasantly resting in bed,  No acute distress. HEENT:  Normocephalic atraumatic.  Sclerae nonicteric, noninjected.  Extraocular movements intact bilaterally. Neck:  Without mass or deformity.  Trachea is midline. Lungs:  Clear to auscultate bilaterally without rhonchi, wheeze, or rales. Heart:  Regular rate and rhythm.  Without murmurs, rubs, or gallops. Abdomen:  Soft, nontender, nondistended.  Without guarding or rebound. Extremities: LUE external fixation intact - distal bandage clean/dry   Data Reviewed: I have personally reviewed following labs and imaging studies  CBC: Recent Labs  Lab 07/27/21 1237 07/28/21 0049 07/29/21 0306 07/30/21 0218 07/30/21 1451 07/31/21 0432 08/01/21 0134 08/02/21 0441  WBC 20.5*   < > 13.0* 10.2  --  8.8 8.1 8.5  NEUTROABS 18.0*  --   --   --   --   --   --   --   HGB 7.6*   < > 8.9* 7.1* 6.8* 7.6* 7.8* 7.6*  HCT 24.4*   < > 28.4* 23.0* 21.7* 23.4* 24.2* 25.0*  MCV 100.8*   < > 90.2 90.9  --  89.7 92.0 92.9  PLT 523*   < > 340 332  --  304 325 307   < > = values in this interval not displayed.    Basic Metabolic Panel: Recent Labs  Lab 07/28/21 0049 07/29/21 0306 07/30/21 0218 07/31/21 0432 08/01/21 0134 08/02/21 0441  NA 136 132* 133* 132* 131* 135  K 5.0 5.1 5.3* 4.8 4.7 3.9  CL 110 104 103 105 102 107  CO2 19* 18* 16* 19* 21* 21*  GLUCOSE 146* 127* 112* 107* 111* 106*  BUN 29* 44* 50* 58* 59* 49*  CREATININE 1.14* 2.53* 2.85* 2.91* 2.71* 1.91*  CALCIUM 7.2* 7.1* 7.5* 7.6* 7.8* 7.7*  MG 2.2  --   --   --   --   --   PHOS 5.9*  --   --   --   --   --     GFR: Estimated Creatinine Clearance: 22 mL/min (A) (by C-G formula based on SCr of 1.91 mg/dL (H)). Liver Function Tests: No results for input(s): AST, ALT, ALKPHOS, BILITOT, PROT, ALBUMIN in the last 168 hours. No results for input(s): LIPASE, AMYLASE in the last 168 hours. No results for input(s): AMMONIA in the last 168 hours. Coagulation Profile: No results for input(s): INR, PROTIME in the last  168 hours. Cardiac Enzymes: Recent Labs  Lab 07/28/21 0049 07/29/21 0306 07/30/21 0218 07/31/21 0432 08/01/21 0134  CKTOTAL 39,443* 8,090* 4,252* 2,840* 2,016*    BNP (last 3 results) No results for input(s): PROBNP in the last 8760 hours. HbA1C: No results for input(s): HGBA1C in the last 72 hours. CBG: No results for input(s): GLUCAP in the last 168 hours. Lipid Profile: No results for input(s): CHOL, HDL, LDLCALC, TRIG, CHOLHDL, LDLDIRECT in the last 72 hours. Thyroid Function Tests: No results for input(s): TSH, T4TOTAL, FREET4, T3FREE, THYROIDAB in the last 72 hours. Anemia Panel: No results for input(s): VITAMINB12, FOLATE, FERRITIN, TIBC, IRON, RETICCTPCT in the last 72 hours.  Sepsis Labs: No results for input(s): PROCALCITON, LATICACIDVEN in the last 168 hours.  Recent Results (  from the past 240 hour(s))  Resp Panel by RT-PCR (Flu A&B, Covid) Nasopharyngeal Swab     Status: None   Collection Time: 07/27/21 11:49 AM   Specimen: Nasopharyngeal Swab; Nasopharyngeal(NP) swabs in vial transport medium  Result Value Ref Range Status   SARS Coronavirus 2 by RT PCR NEGATIVE NEGATIVE Final    Comment: (NOTE) SARS-CoV-2 target nucleic acids are NOT DETECTED.  The SARS-CoV-2 RNA is generally detectable in upper respiratory specimens during the acute phase of infection. The lowest concentration of SARS-CoV-2 viral copies this assay can detect is 138 copies/mL. A negative result does not preclude SARS-Cov-2 infection and should not be used as the sole basis for treatment or other patient management decisions. A negative result may occur with  improper specimen collection/handling, submission of specimen other than nasopharyngeal swab, presence of viral mutation(s) within the areas targeted by this assay, and inadequate number of viral copies(<138 copies/mL). A negative result must be combined with clinical observations, patient history, and epidemiological information. The  expected result is Negative.  Fact Sheet for Patients:  EntrepreneurPulse.com.au  Fact Sheet for Healthcare Providers:  IncredibleEmployment.be  This test is no t yet approved or cleared by the [place] FDA and  has been authorized for detection and/or diagnosis of SARS-CoV-2 by FDA under an Emergency Use Authorization (EUA). This EUA will remain  in effect (meaning this test can be used) for the duration of the COVID-19 declaration under Section 564(b)(1) of the Act, 21 U.S.C.section 360bbb-3(b)(1), unless the authorization is terminated  or revoked sooner.       Influenza A by PCR NEGATIVE NEGATIVE Final   Influenza B by PCR NEGATIVE NEGATIVE Final    Comment: (NOTE) The Xpert Xpress SARS-CoV-2/FLU/RSV plus assay is intended as an aid in the diagnosis of influenza from Nasopharyngeal swab specimens and should not be used as a sole basis for treatment. Nasal washings and aspirates are unacceptable for Xpert Xpress SARS-CoV-2/FLU/RSV testing.  Fact Sheet for Patients: EntrepreneurPulse.com.au  Fact Sheet for Healthcare Providers: IncredibleEmployment.be  This test is not yet approved or cleared by the [place] FDA and has been authorized for detection and/or diagnosis of SARS-CoV-2 by FDA under an Emergency Use Authorization (EUA). This EUA will remain in effect (meaning this test can be used) for the duration of the COVID-19 declaration under Section 564(b)(1) of the Act, 21 U.S.C. section 360bbb-3(b)(1), unless the authorization is terminated or revoked.  Performed at [facility], [place] [address] [place], [place] [address]           Radiology Studies: No results found.  Scheduled Meds:  sodium chloride   Intravenous Once   acetaminophen  650 mg Oral Q8H   Chlorhexidine Gluconate Cloth  6 each Topical Daily   docusate sodium  100 mg Oral BID   mouth rinse  15 mL Mouth  Rinse BID   pantoprazole  40 mg Oral BID AC   senna  1 tablet Oral BID   sodium zirconium cyclosilicate  10 g Oral Once   Continuous Infusions:  sodium chloride 75 mL/hr at 08/01/21 2028   sodium chloride      LOS: 6 days   Time spent: 76min  Sarah Battaglia C Fredy Gladu, DO [facility]  If 7PM-7AM, please contact night-coverage [email or web address]  08/02/2021, 7:36 AM

## 2021-08-02 NOTE — Progress Notes (Signed)
Occupational Therapy Treatment Patient Details Name: [name] MRN: [id number] DOB: 01-12-1941 Today's Date: 08/02/2021   History of present illness Patient is a 81 y/o female who presents on 07/27/21 with left arm trauma after getting LUE stuck in a dresser. Found to have left humerus fx and rhabdomyolosis. s/p fasciotomy, external fixator and wound vac placement 07/27/21. PMH includes dementia, osteoporosis, anxiety, depression, HTN, skin ca, HTN.   OT comments  Pt seen today for splint check and LUE positioning/ROM. Pt reporting pain on ventral aspect of LUE including fingertips, palm, and forearm. Splint check performed, no apparent areas of irritation/swelling, pt tolerating well. Splint wear schedule confirmed with NSG and taped on wall in pt's room. Performed PROM of digits, wrist, elbow, and shoulder, all WFL relative to edema. Pt unable to perform active ROM of hand/wrist/elbow at this time, very minimal initiation for shoulder motion due to pain. Pt LUE positioned on 3 pillows for maximum elevation while at bed level. Pt with minimal dorsiflexion in bil feet, contacted RN/MD about orders for prevalon boots and air mattress to preserve skin integrity. Pt presenting with impairments listed below, will follow acutely. Recommend d/c to SNF.   Recommendations for follow up therapy are one component of a multi-disciplinary discharge planning process, led by the attending physician.  Recommendations may be updated based on patient status, additional functional criteria and insurance authorization.    Follow Up Recommendations  Skilled nursing-short term rehab (<3 hours/day)    Assistance Recommended at Discharge Frequent or constant Supervision/Assistance  Patient can return home with the following  Two people to help with walking and/or transfers;A lot of help with bathing/dressing/bathroom;Assistance with cooking/housework;Assist for transportation;Help with stairs or ramp for entrance    Equipment Recommendations  BSC/3in1    Recommendations for Other Services      Precautions / Restrictions Precautions Precautions: Fall Precaution Comments: A/AA/PROM ROM as tolerated LUE; multiple blisters BLE adn L UE Required Braces or Orthoses: Other Brace Other Brace: external fixator Restrictions Weight Bearing Restrictions: Yes LUE Weight Bearing: Non weight bearing       Mobility Bed Mobility Overal bed mobility: Needs Assistance Bed Mobility: Rolling Rolling: Max assist, +2 for physical assistance Sidelying to sit: Max assist, +2 for physical assistance, HOB elevated   Sit to supine: Max assist, +2 for physical assistance, HOB elevated        Transfers                   General transfer comment: not attempted     Balance               Standing balance comment: not attempted                           ADL either performed or assessed with clinical judgement   ADL   Eating/Feeding: Minimal assistance;Bed level   Grooming: Wash/dry hands;Maximal assistance;Bed level                                 General ADL Comments: Max A +2 for rolling adn repositioning in bed    Extremity/Trunk Assessment Upper Extremity Assessment Upper Extremity Assessment: LUE deficits/detail RUE Deficits / Details: generalized weakness LUE Deficits / Details: increased swelling in L hand, attempted to use arm elevator, however unable to place around ex fix. Pt tolerated PROM of hand/wrist/digits/elbow/and shoulder, reporting pain with movement. PROM limited  by swelling, no active motion in digits/hand/wrist at this time. LUE: Unable to fully assess due to pain LUE Sensation: decreased light touch LUE Coordination: decreased fine motor;decreased gross motor   Lower Extremity Assessment Lower Extremity Assessment: Defer to PT evaluation        Vision   Vision Assessment?: No apparent visual deficits   Perception  Perception Perception: Not tested   Praxis Praxis Praxis: Not tested    Cognition Arousal/Alertness: Awake/alert Behavior During Therapy: WFL for tasks assessed/performed Overall Cognitive Status: Impaired/Different from baseline                                 General Comments: dementia at baseline        Exercises Exercises: General Upper Extremity General Exercises - Upper Extremity Shoulder Flexion: AAROM, PROM, Left, Supine, 10 reps Elbow Flexion: PROM, Left, 10 reps, Supine Elbow Extension: PROM, Left, 10 reps, Supine Wrist Flexion: PROM, Left, 10 reps, Supine Wrist Extension: PROM, Left, 10 reps, Supine Digit Composite Flexion: PROM, Left, 10 reps, Supine Composite Extension: PROM, Left, 10 reps, Supine Other Exercises Other Exercises: splint check - tolerating well    Shoulder Instructions       General Comments Pt LUE supported with 3 pillows/foam, ordered prevlon boots and air bed as pt with minimal movement. Splint wear schedule posted in pt room for 2 hrs on/2 hrs off RN notified    Pertinent Vitals/ Pain       Pain Assessment Pain Assessment: Faces Pain Score: 7  Faces Pain Scale: Hurts even more Pain Location: LUE, reports cramping on ventral side of digits, palm and forearm Pain Descriptors / Indicators: Guarding, Grimacing, Aching Pain Intervention(s): Limited activity within patient's tolerance, Monitored during session, Repositioned  Home Living                                          Prior Functioning/Environment              Frequency  Min 3X/week        Progress Toward Goals  OT Goals(current goals can now be found in the care plan section)  Progress towards OT goals: Progressing toward goals  Acute Rehab OT Goals Patient Stated Goal: none stated OT Goal Formulation: With patient Time For Goal Achievement: 08/11/21 Potential to Achieve Goals: Good ADL Goals Pt Will Perform Grooming: with min  assist;sitting Pt Will Perform Upper Body Dressing: with min assist;sitting Pt Will Transfer to Toilet: with mod assist;with +2 assist;stand pivot transfer Pt/caregiver will Perform Home Exercise Program: Left upper extremity;Increased ROM;With written HEP provided;With minimal assist Additional ADL Goal #1: Pt will tolerate L resting hadn splint for 2 hour intervals to reduce risk of contracture formation Additional ADL Goal #2: Family will demonstrate appropirate positioning to decrease dependent edema L hand/forearm  Plan Discharge plan remains appropriate    Co-evaluation                 AM-PAC OT "6 Clicks" Daily Activity     Outcome Measure   Help from another person eating meals?: A Little Help from another person taking care of personal grooming?: A Lot Help from another person toileting, which includes using toliet, bedpan, or urinal?: Total Help from another person bathing (including washing, rinsing, drying)?: Total Help from another person to put on and  taking off regular upper body clothing?: Total Help from another person to put on and taking off regular lower body clothing?: Total 6 Click Score: 9    End of Session    OT Visit Diagnosis: Unsteadiness on feet (R26.81);Pain;Other abnormalities of gait and mobility (R26.89);Muscle weakness (generalized) (M62.81);Other symptoms and signs involving cognitive function Pain - Right/Left: Left Pain - part of body: Arm;Shoulder;Hand   Activity Tolerance Patient tolerated treatment well   Patient Left in bed;with call bell/phone within reach;with bed alarm set   Nurse Communication Mobility status;Patient requests pain meds;Weight bearing status;Precautions;Other (comment) (RN/MD notified of need for air bed and prevelon boots as well as elevating arm and splint wear schedule)        Time: 1281-1886 OT Time Calculation (min): 24 min  Charges: OT General Charges $OT Visit: 1 Visit OT Treatments $Therapeutic  Activity: 23-37 mins  Lynnda Child, OTD, OTR/L Acute Rehab [phone] [place] 08/02/2021, 11:39 AM

## 2021-08-02 NOTE — Plan of Care (Signed)
°  Problem: Nutrition: °Goal: Adequate nutrition will be maintained °Outcome: Progressing °  °Problem: Coping: °Goal: Level of anxiety will decrease °Outcome: Progressing °  °Problem: Safety: °Goal: Ability to remain free from injury will improve °Outcome: Progressing °  °

## 2021-08-02 NOTE — Progress Notes (Signed)
Occupational Therapy Treatment Patient Details Name: [name] MRN: [id number] DOB: 09/30/1940 Today's Date: 08/02/2021   History of present illness Patient is a 81 y/o female who presents on 07/27/21 with left arm trauma after getting LUE stuck in a dresser. Found to have left humerus fx and rhabdomyolosis. s/p fasciotomy, external fixator and wound vac placement 07/27/21. PMH includes dementia, osteoporosis, anxiety, depression, HTN, skin ca, HTN.   OT comments  Pt seen for second session today for positioning and ROM, reapplied splint and reviewed splint wear schedule with NSG staff as well as LUE positioning on pillows to elevate. Pt tolerated PROM of hand, wrist, digits, elbow, and shoulder. Unable to initiate active movement at this time. Pt reporting increased tightness/aching pain with movement. Assisted RN/NT with transferring pt to air mattress bed and donning prevalon boots. Pt max A +2-3  for bed mobility. Pt presenting with impairments listed below, will follow acutely. Continue to recommend SNF at d/c.   Recommendations for follow up therapy are one component of a multi-disciplinary discharge planning process, led by the attending physician.  Recommendations may be updated based on patient status, additional functional criteria and insurance authorization.    Follow Up Recommendations  Skilled nursing-short term rehab (<3 hours/day)    Assistance Recommended at Discharge Frequent or constant Supervision/Assistance  Patient can return home with the following  Two people to help with walking and/or transfers;A lot of help with bathing/dressing/bathroom;Assistance with cooking/housework;Assist for transportation;Help with stairs or ramp for entrance   Equipment Recommendations  BSC/3in1    Recommendations for Other Services      Precautions / Restrictions Precautions Precautions: Fall Precaution Comments: A/AA/PROM ROM as tolerated LUE; multiple blisters BLE adn L  UE Required Braces or Orthoses: Other Brace Other Brace: external fixator Restrictions Weight Bearing Restrictions: Yes LUE Weight Bearing: Non weight bearing       Mobility Bed Mobility Overal bed mobility: Needs Assistance Bed Mobility: Rolling Rolling: Max assist, +2 for physical assistance Sidelying to sit: Max assist, +2 for physical assistance, HOB elevated   Sit to supine: Max assist, +2 for physical assistance, HOB elevated   General bed mobility comments: max A +2 to laterally slide pt to new air mattress bed    Transfers                   General transfer comment: not attempted     Balance               Standing balance comment: not attempted                           ADL either performed or assessed with clinical judgement   ADL Overall ADL's : Needs assistance/impaired                                            Extremity/Trunk Assessment Upper Extremity Assessment Upper Extremity Assessment: LUE deficits/detail RUE Deficits / Details: generalized weakness LUE Deficits / Details: increased swelling in L hand, attempted to use arm elevator, however unable to place around ex fix. Pt tolerated PROM of hand/wrist/digits/elbow/and shoulder, reporting pain with movement. PROM limited by swelling, no active motion in digits/hand/wrist at this time. LUE: Unable to fully assess due to immobilization;Shoulder pain with ROM;Shoulder pain at rest LUE Sensation: decreased light touch LUE Coordination: decreased fine motor;decreased  gross motor   Lower Extremity Assessment Lower Extremity Assessment: Defer to PT evaluation        Vision   Vision Assessment?: No apparent visual deficits   Perception Perception Perception: Not tested   Praxis Praxis Praxis: Not tested    Cognition Arousal/Alertness: Awake/alert Behavior During Therapy: WFL for tasks assessed/performed Overall Cognitive Status: Impaired/Different from  baseline                                 General Comments: dementia at baseline        Exercises Exercises: General Upper Extremity General Exercises - Upper Extremity Shoulder Flexion: PROM, Left, Supine, 10 reps Shoulder ABduction: PROM, Left, 10 reps, Supine Elbow Flexion: PROM, Left, 10 reps, Supine Elbow Extension: PROM, Left, 10 reps, Supine Wrist Flexion: PROM, Left, 10 reps, Supine Wrist Extension: PROM, Left, 10 reps, Supine Digit Composite Flexion: PROM, Left, 10 reps, Supine Composite Extension: PROM, Left, 10 reps, Supine Other Exercises Other Exercises: splint check - tolerating well    Shoulder Instructions       General Comments RN/NT bathing pt upon end of session, instructed RN to prop pt's LUE up on pillows and reiterated splint wear schedule    Pertinent Vitals/ Pain       Pain Assessment Pain Assessment: Faces Pain Score: 5  Faces Pain Scale: Hurts even more Pain Location: LUE, reports cramping on ventral side of digits, palm and forearm Pain Descriptors / Indicators: Guarding, Grimacing, Aching Pain Intervention(s): Limited activity within patient's tolerance, Monitored during session, Repositioned  Home Living                                          Prior Functioning/Environment              Frequency  Min 3X/week        Progress Toward Goals  OT Goals(current goals can now be found in the care plan section)     Acute Rehab OT Goals Patient Stated Goal: none stated OT Goal Formulation: With patient Time For Goal Achievement: 08/11/21 Potential to Achieve Goals: Good ADL Goals Pt Will Perform Grooming: with min assist;sitting Pt Will Perform Upper Body Dressing: with min assist;sitting Pt Will Transfer to Toilet: with mod assist;with +2 assist;stand pivot transfer Pt/caregiver will Perform Home Exercise Program: Left upper extremity;Increased ROM;With written HEP provided;With minimal  assist Additional ADL Goal #1: Pt will tolerate L resting hadn splint for 2 hour intervals to reduce risk of contracture formation Additional ADL Goal #2: Family will demonstrate appropirate positioning to decrease dependent edema L hand/forearm  Plan Discharge plan remains appropriate    Co-evaluation                 AM-PAC OT "6 Clicks" Daily Activity     Outcome Measure   Help from another person eating meals?: A Little Help from another person taking care of personal grooming?: A Lot Help from another person toileting, which includes using toliet, bedpan, or urinal?: Total Help from another person bathing (including washing, rinsing, drying)?: Total Help from another person to put on and taking off regular upper body clothing?: Total Help from another person to put on and taking off regular lower body clothing?: Total 6 Click Score: 9    End of Session    OT Visit Diagnosis:  Unsteadiness on feet (R26.81);Pain;Other abnormalities of gait and mobility (R26.89);Muscle weakness (generalized) (M62.81);Other symptoms and signs involving cognitive function Pain - Right/Left: Left Pain - part of body: Arm;Shoulder;Hand;Hip;Leg   Activity Tolerance Patient tolerated treatment well   Patient Left with SCD's reapplied;in bed;with call bell/phone within reach;with bed alarm set   Nurse Communication Mobility status;Other (comment) (followed up on prevalon boots and air mattress)        Time: 0981-1914 OT Time Calculation (min): 39 min  Charges: OT General Charges $OT Visit: 1 Visit OT Treatments $Therapeutic Activity: 8-22 mins $Orthotics/Prosthetics Check: 8-22 mins  Lynnda Child, OTD, OTR/L Acute Rehab [phone]) [phone] - [place] 08/02/2021, 4:25 PM

## 2021-08-02 NOTE — Progress Notes (Signed)
Orthopedic Tech Progress Note Patient Details:  [name] 1941/06/30 [id number]  Ortho Devices Type of Ortho Device: Sling arm elevator Ortho Device/Splint Location: Left arm Ortho Device/Splint Interventions: Application   Post Interventions Patient Tolerated: Well Instructions Provided: Care of device, Adjustment of device  [name] 08/02/2021, 8:10 AM

## 2021-08-03 LAB — BASIC METABOLIC PANEL
Anion gap: 8 (ref 5–15)
BUN: 40 mg/dL — ABNORMAL HIGH (ref 8–23)
CO2: 20 mmol/L — ABNORMAL LOW (ref 22–32)
Calcium: 7.7 mg/dL — ABNORMAL LOW (ref 8.9–10.3)
Chloride: 109 mmol/L (ref 98–111)
Creatinine, Ser: 1.54 mg/dL — ABNORMAL HIGH (ref 0.44–1.00)
GFR, Estimated: 34 mL/min — ABNORMAL LOW (ref >60)
Glucose, Bld: 147 mg/dL — ABNORMAL HIGH (ref 70–99)
Potassium: 3.9 mmol/L (ref 3.5–5.1)
Sodium: 137 mmol/L (ref 135–145)

## 2021-08-03 LAB — CBC
HCT: 25.3 % — ABNORMAL LOW (ref 36.0–46.0)
Hemoglobin: 7.9 g/dL — ABNORMAL LOW (ref 12.0–15.0)
MCH: 29.2 pg (ref 26.0–34.0)
MCHC: 31.2 g/dL (ref 30.0–36.0)
MCV: 93.4 fL (ref 80.0–100.0)
Platelets: 340 10*3/uL (ref 150–400)
RBC: 2.71 MIL/uL — ABNORMAL LOW (ref 3.87–5.11)
RDW: 19.7 % — ABNORMAL HIGH (ref 11.5–15.5)
WBC: 10.4 10*3/uL (ref 4.0–10.5)
nRBC: 0.7 % — ABNORMAL HIGH (ref 0.0–0.2)

## 2021-08-03 MED ORDER — BOOST / RESOURCE BREEZE PO LIQD CUSTOM
1.0000 | Freq: Three times a day (TID) | ORAL | Status: DC
  Administered 2021-08-03 – 2021-08-07 (×10): 1 via ORAL
  Filled 2021-08-03 (×4): qty 1

## 2021-08-03 NOTE — Plan of Care (Signed)

## 2021-08-03 NOTE — Progress Notes (Signed)
Patient complained of new chest pain and discomfort. Rated it a 6/10. Stated it hurts to breathe. Notified Team and RR. EKG completed, VS stable, Placed on O2 and Tele. Will continue to monitor.

## 2021-08-03 NOTE — Progress Notes (Signed)
PROGRESS NOTE    [name]  CBS:[id number] DOB: [birth year] DOA: 07/27/2021 PCP: Orpah Melter, MD   Brief Narrative:  [name] is a 81 y.o. female with medical history significant of HTN, alzheimers dementia, anemia,  anxiety and depression who presents to ED after fall with subsequent left arm trauma. History is from her son due to her dementia. Ortho consulted given compartment syndrome/humerus fracture on L side.  1/23 status post surgery on 07/27/2021.  Hemoglobin 6.0, getting 2 units of packed red blood cell transfusion this a.m. 1/24 s/p ORIF for repeat evaluation of left forearm status post fasciotomies possible debridement of nonviable necrotic muscle, possible wound closure today by orthopedics.  FOB positive, creatinine up 1/25 pain well controlled attempting to ambulate with PT later today if possible -has been difficult given her walker bound status with left arm fracture 1/26 ortho to re-evaluate left arm compartment syndrome 1/27-1/29 external fixation remains intact awaiting ORIF per their expertise likely 1/30-31 pending resolution of distal edema/swelling.  Assessment & Plan:   Left humeral fracture- (present on admission) S/p fasciotomy Lt forearm, external fixation Lt humerus, wound vac application on 8/46 6/59 s/p OR for compartment closure -tolerated well Lovenox per orthopedics PT/OT ongoing, likely disposition to SNF given walker bound status and inability to use walker given left arm fracture (currently nonweightbearing per Ortho).   Traumatic rhabdomyolysis ([place])- (present on admission) improving AKI, decreased urine output Concurrent hyperkalemia -Ck trending down appropriately -Continue low rate IV fluids given poor p.o. intake perioperatively today -Creatinine continues to trend upwards, urine output low overnight but improving this morning -Potassium minimally elevated, should correct with increased urine output   Acute blood loss anemia due to  surgery Chronic anemia, iron deficiency Questionable GI bleed - Hemoglobin continues to drop slowly in the setting of above - Wound vac continues to put out blood tinged fluid - Fobt positive -follow clinically - GI consulted given ongoing drop in Hgb per son's wishes but patient and son are not agreeable for endoscopy - Hold lovenox   Essential hypertension- (present on admission) BP stable continue to monitor   Alzheimer disease ([place])- (present on admission) At baseline, delirium precautions    Thoracic ascending aortic aneurysm- (present on admission) Atypical chest pain -Transient atypical chest pain with negative cardiac work-up including EKG Noted and follows outpatient.   Hyperlipidemia Holding statin due to rhabdomyolysis   Hyponatremia Mild, chronic, no indication for treatment   DVT prophylaxis: scd, hold lovenox due to ongoing anemia and questionable blood loss Code Status: DNR -confirmed with son at bedside, reviewed living well which indicates "patient would want a natural death without any heroic measures or artificial life support/nutrition" Family Communication: Son updated at bedside  Status is: Inpatient  Dispo: The patient is from: Home              Anticipated d/c is to: SNF              Anticipated d/c date is: 72+ hours              Patient currently not medically stable for discharge  Consultants:  Orthopedic surgery  Procedures:  External fixation left humerus Compartment syndrome evaluation and debridement of distal L arm ORIF pending  Antimicrobials:  None  Subjective: No acute issues or events overnight, patient reports transient episode of chest pain this morning at shift change, unfortunately due to patient's baseline mental status she is unable to further clarify or describe the pain states that it is "  everywhere" and when asked if it hurts in her legs and head and arms she agreed.   Objective: Vitals:   08/02/21 1552 08/02/21 2004  08/02/21 2006 08/03/21 0604  BP: 135/65 (!) 129/51 (!) 129/51 138/63  Pulse: 88 98 98 99  Resp: 16  20   Temp: (!) 97.4 F (36.3 C) 98.6 F (37 C) 98.6 F (37 C)   TempSrc: Oral Oral Oral   SpO2: 97% 97% 97% 98%  Weight:      Height:        Intake/Output Summary (Last 24 hours) at 08/03/2021 0743 Last data filed at 08/03/2021 0500 Gross per 24 hour  Intake --  Output 2950 ml  Net -2950 ml    Filed Weights   07/29/21 0004 07/29/21 0754  Weight: 80 kg 80 kg    Examination:  General:  Pleasantly resting in bed, No acute distress. HEENT:  Normocephalic atraumatic.  Sclerae nonicteric, noninjected.  Extraocular movements intact bilaterally. Neck:  Without mass or deformity.  Trachea is midline. Lungs:  Clear to auscultate bilaterally without rhonchi, wheeze, or rales. Heart:  Regular rate and rhythm.  Without murmurs, rubs, or gallops. Abdomen:  Soft, nontender, nondistended.  Without guarding or rebound. Extremities: LUE external fixation intact - distal bandage clean/dry   Data Reviewed: I have personally reviewed following labs and imaging studies  CBC: Recent Labs  Lab 07/27/21 1237 07/28/21 0049 07/30/21 0218 07/30/21 1451 07/31/21 0432 08/01/21 0134 08/02/21 0441 08/03/21 0556  WBC 20.5*   < > 10.2  --  8.8 8.1 8.5 10.4  NEUTROABS 18.0*  --   --   --   --   --   --   --   HGB 7.6*   < > 7.1* 6.8* 7.6* 7.8* 7.6* 7.9*  HCT 24.4*   < > 23.0* 21.7* 23.4* 24.2* 25.0* 25.3*  MCV 100.8*   < > 90.9  --  89.7 92.0 92.9 93.4  PLT 523*   < > 332  --  304 325 307 340   < > = values in this interval not displayed.    Basic Metabolic Panel: Recent Labs  Lab 07/28/21 0049 07/29/21 0306 07/30/21 0218 07/31/21 0432 08/01/21 0134 08/02/21 0441  NA 136 132* 133* 132* 131* 135  K 5.0 5.1 5.3* 4.8 4.7 3.9  CL 110 104 103 105 102 107  CO2 19* 18* 16* 19* 21* 21*  GLUCOSE 146* 127* 112* 107* 111* 106*  BUN 29* 44* 50* 58* 59* 49*  CREATININE 1.14* 2.53* 2.85* 2.91*  2.71* 1.91*  CALCIUM 7.2* 7.1* 7.5* 7.6* 7.8* 7.7*  MG 2.2  --   --   --   --   --   PHOS 5.9*  --   --   --   --   --     GFR: Estimated Creatinine Clearance: 22 mL/min (A) (by C-G formula based on SCr of 1.91 mg/dL (H)). Liver Function Tests: No results for input(s): AST, ALT, ALKPHOS, BILITOT, PROT, ALBUMIN in the last 168 hours. No results for input(s): LIPASE, AMYLASE in the last 168 hours. No results for input(s): AMMONIA in the last 168 hours. Coagulation Profile: No results for input(s): INR, PROTIME in the last 168 hours. Cardiac Enzymes: Recent Labs  Lab 07/28/21 0049 07/29/21 0306 07/30/21 0218 07/31/21 0432 08/01/21 0134  CKTOTAL 39,443* 8,090* 4,252* 2,840* 2,016*    BNP (last 3 results) No results for input(s): PROBNP in the last 8760 hours. HbA1C: No results for input(s):  HGBA1C in the last 72 hours. CBG: No results for input(s): GLUCAP in the last 168 hours. Lipid Profile: No results for input(s): CHOL, HDL, LDLCALC, TRIG, CHOLHDL, LDLDIRECT in the last 72 hours. Thyroid Function Tests: No results for input(s): TSH, T4TOTAL, FREET4, T3FREE, THYROIDAB in the last 72 hours. Anemia Panel: No results for input(s): VITAMINB12, FOLATE, FERRITIN, TIBC, IRON, RETICCTPCT in the last 72 hours.  Sepsis Labs: No results for input(s): PROCALCITON, LATICACIDVEN in the last 168 hours.  Recent Results (from the past 240 hour(s))  Resp Panel by RT-PCR (Flu A&B, Covid) Nasopharyngeal Swab     Status: None   Collection Time: 07/27/21 11:49 AM   Specimen: Nasopharyngeal Swab; Nasopharyngeal(NP) swabs in vial transport medium  Result Value Ref Range Status   SARS Coronavirus 2 by RT PCR NEGATIVE NEGATIVE Final    Comment: (NOTE) SARS-CoV-2 target nucleic acids are NOT DETECTED.  The SARS-CoV-2 RNA is generally detectable in upper respiratory specimens during the acute phase of infection. The lowest concentration of SARS-CoV-2 viral copies this assay can detect is 138  copies/mL. A negative result does not preclude SARS-Cov-2 infection and should not be used as the sole basis for treatment or other patient management decisions. A negative result may occur with  improper specimen collection/handling, submission of specimen other than nasopharyngeal swab, presence of viral mutation(s) within the areas targeted by this assay, and inadequate number of viral copies(<138 copies/mL). A negative result must be combined with clinical observations, patient history, and epidemiological information. The expected result is Negative.  Fact Sheet for Patients:  EntrepreneurPulse.com.au  Fact Sheet for Healthcare Providers:  IncredibleEmployment.be  This test is no t yet approved or cleared by the [place] FDA and  has been authorized for detection and/or diagnosis of SARS-CoV-2 by FDA under an Emergency Use Authorization (EUA). This EUA will remain  in effect (meaning this test can be used) for the duration of the COVID-19 declaration under Section 564(b)(1) of the Act, 21 U.S.C.section 360bbb-3(b)(1), unless the authorization is terminated  or revoked sooner.       Influenza A by PCR NEGATIVE NEGATIVE Final   Influenza B by PCR NEGATIVE NEGATIVE Final    Comment: (NOTE) The Xpert Xpress SARS-CoV-2/FLU/RSV plus assay is intended as an aid in the diagnosis of influenza from Nasopharyngeal swab specimens and should not be used as a sole basis for treatment. Nasal washings and aspirates are unacceptable for Xpert Xpress SARS-CoV-2/FLU/RSV testing.  Fact Sheet for Patients: EntrepreneurPulse.com.au  Fact Sheet for Healthcare Providers: IncredibleEmployment.be  This test is not yet approved or cleared by the [place] FDA and has been authorized for detection and/or diagnosis of SARS-CoV-2 by FDA under an Emergency Use Authorization (EUA). This EUA will remain in effect (meaning  this test can be used) for the duration of the COVID-19 declaration under Section 564(b)(1) of the Act, 21 U.S.C. section 360bbb-3(b)(1), unless the authorization is terminated or revoked.  Performed at [facility], [place] [address], [place], [place] [address]    Radiology Studies: No results found.  Scheduled Meds:  sodium chloride   Intravenous Once   acetaminophen  650 mg Oral Q8H   Chlorhexidine Gluconate Cloth  6 each Topical Daily   docusate sodium  100 mg Oral BID   mouth rinse  15 mL Mouth Rinse BID   pantoprazole  40 mg Oral BID AC   senna  1 tablet Oral BID   sodium zirconium cyclosilicate  10 g Oral Once   Continuous  Infusions:  sodium chloride 75 mL/hr at 08/03/21 0129   sodium chloride      LOS: 7 days   Time spent: 28min  Inna Tisdell C Ary Rudnick, DO [facility]  If 7PM-7AM, please contact night-coverage [email or web address]  08/03/2021, 7:43 AM

## 2021-08-03 NOTE — Progress Notes (Signed)
Occupational Therapy Treatment Patient Details Name: [name] MRN: [id number] DOB: [birth year] Today's Date: 08/03/2021   History of present illness Patient is a 81 y/o female who presents on 07/27/21 with left arm trauma after getting LUE stuck in a dresser. Found to have left humerus fx and rhabdomyolosis. s/p fasciotomy, external fixator and wound vac placement 07/27/21. PMH includes dementia, osteoporosis, anxiety, depression, HTN, skin ca, HTN.   OT comments  Pt seen today for splint check/positioning/ROM, pt tolerating splint well, no signs of irritation/skin breakdown. Splint doffed upon arrival, donned at end of session, RN aware of time. Pt tolerating PROM of digits/hand/wrist/elbow/shoulder, however unable to initiate/actively range LUE. Pt has ~45 degrees of shoulder flexion before pain, shoulder ABD WNL. Digits/hand/wrist/elbow PROM all WNL given bandage and edema. Interdry placed between pt LUE and trunk to preserve skin integrity, pt arm elevated on 3 pillows above heart at end of session. Pt presenting with impairments listed below, will  continue to follow. Recommend SNF at d/c.   Recommendations for follow up therapy are one component of a multi-disciplinary discharge planning process, led by the attending physician.  Recommendations may be updated based on patient status, additional functional criteria and insurance authorization.    Follow Up Recommendations  Skilled nursing-short term rehab (<3 hours/day)    Assistance Recommended at Discharge Frequent or constant Supervision/Assistance  Patient can return home with the following  Two people to help with walking and/or transfers;A lot of help with bathing/dressing/bathroom;Assistance with cooking/housework;Assist for transportation;Help with stairs or ramp for entrance   Equipment Recommendations  BSC/3in1    Recommendations for Other Services      Precautions / Restrictions Precautions Precautions: Fall Precaution  Comments: A/AA/PROM ROM as tolerated LUE; multiple blisters BLE adn L UE Required Braces or Orthoses: Other Brace Other Brace: external fixator Restrictions Weight Bearing Restrictions: Yes LUE Weight Bearing: Non weight bearing       Mobility Bed Mobility Overal bed mobility: Needs Assistance             General bed mobility comments: max A +2 to scoot up in bed    Transfers                   General transfer comment: not attempted     Balance                                           ADL either performed or assessed with clinical judgement   ADL       Grooming: Wash/dry hands;Maximal assistance;Bed level                                      Extremity/Trunk Assessment Upper Extremity Assessment Upper Extremity Assessment: LUE deficits/detail;Generalized weakness LUE Deficits / Details: decreased swelling in L hand compared to previous session, Pt unable to actively move L hand/wrist/digits/foream/shoulder, tolerates PROM well.   Lower Extremity Assessment Lower Extremity Assessment: Defer to PT evaluation        Vision   Vision Assessment?: No apparent visual deficits   Perception Perception Perception: Not tested   Praxis Praxis Praxis: Not tested    Cognition Arousal/Alertness: Awake/alert Behavior During Therapy: WFL for tasks assessed/performed Overall Cognitive Status: Impaired/Different from baseline  General Comments: dementia at baseline        Exercises Exercises: General Upper Extremity General Exercises - Upper Extremity Shoulder Flexion: PROM, Left, Supine, 10 reps Shoulder ABduction: PROM, Left, 10 reps, Supine Elbow Flexion: PROM, Left, 10 reps, Supine Elbow Extension: PROM, Left, 10 reps, Supine Wrist Flexion: PROM, Left, 10 reps, Supine Wrist Extension: PROM, Left, 10 reps, Supine Digit Composite Flexion: PROM, Left, 10 reps,  Supine Composite Extension: PROM, Left, 10 reps, Supine    Shoulder Instructions       General Comments pt seen today for splint check/positioning/ROM, pt able to tolerate PROM of hand/wrist/forearm/shoulder, however unable to perform AROM/AAROM. Interdry placed between pt's LUE and trunk to preserve skin integrity. no irritation noted from split, pt has good skin integrity between digits    Pertinent Vitals/ Pain       Pain Assessment Pain Assessment: Faces Pain Score: 5  Pain Location: LUE, reports cramping on ventral side of digits, palm and forearm Pain Descriptors / Indicators: Guarding, Grimacing, Aching Pain Intervention(s): Limited activity within patient's tolerance, Monitored during session, Patient requesting pain meds-RN notified, Repositioned  Home Living                                          Prior Functioning/Environment              Frequency  Min 3X/week        Progress Toward Goals  OT Goals(current goals can now be found in the care plan section)  Progress towards OT goals: Progressing toward goals  Acute Rehab OT Goals Patient Stated Goal: none stated OT Goal Formulation: With patient Time For Goal Achievement: 08/11/21 Potential to Achieve Goals: Good ADL Goals Pt Will Perform Grooming: with min assist;sitting Pt Will Perform Upper Body Dressing: with min assist;sitting Pt Will Transfer to Toilet: with mod assist;with +2 assist;stand pivot transfer Pt/caregiver will Perform Home Exercise Program: Left upper extremity;Increased ROM;With written HEP provided;With minimal assist Additional ADL Goal #1: Pt will tolerate L resting hadn splint for 2 hour intervals to reduce risk of contracture formation Additional ADL Goal #2: Family will demonstrate appropirate positioning to decrease dependent edema L hand/forearm  Plan Discharge plan remains appropriate    Co-evaluation                 AM-PAC OT "6 Clicks" Daily  Activity     Outcome Measure   Help from another person eating meals?: A Little Help from another person taking care of personal grooming?: A Lot Help from another person toileting, which includes using toliet, bedpan, or urinal?: Total Help from another person bathing (including washing, rinsing, drying)?: Total Help from another person to put on and taking off regular upper body clothing?: Total Help from another person to put on and taking off regular lower body clothing?: Total 6 Click Score: 9    End of Session    OT Visit Diagnosis: Unsteadiness on feet (R26.81);Pain;Other abnormalities of gait and mobility (R26.89);Muscle weakness (generalized) (M62.81);Other symptoms and signs involving cognitive function Pain - Right/Left: Left Pain - part of body: Arm;Shoulder;Hand;Hip;Leg   Activity Tolerance Patient tolerated treatment well   Patient Left with SCD's reapplied;in bed;with call bell/phone within reach;with bed alarm set   Nurse Communication Mobility status;Other (comment) (notified RN of when splint was donned)        Time: 1696-7893 OT Time Calculation (min): 28  min  Charges: OT General Charges $OT Visit: 1 Visit OT Treatments $Therapeutic Activity: 8-22 mins $Orthotics/Prosthetics Check: 8-22 mins  Lynnda Child, OTD, OTR/L Acute Rehab [phone]) [phone] - [place] 08/03/2021, 12:22 PM

## 2021-08-04 ENCOUNTER — Inpatient Hospital Stay (HOSPITAL_COMMUNITY): Payer: Medicare PPO

## 2021-08-04 MED ORDER — CEFADROXIL 500 MG PO CAPS
500.0000 mg | ORAL_CAPSULE | Freq: Two times a day (BID) | ORAL | Status: DC
  Administered 2021-08-04 – 2021-08-05 (×3): 500 mg via ORAL
  Filled 2021-08-04 (×5): qty 1

## 2021-08-04 MED ORDER — GABAPENTIN 100 MG PO CAPS
100.0000 mg | ORAL_CAPSULE | Freq: Two times a day (BID) | ORAL | Status: DC
  Administered 2021-08-04 – 2021-08-06 (×5): 100 mg via ORAL
  Filled 2021-08-04 (×5): qty 1

## 2021-08-04 NOTE — Progress Notes (Signed)
Physical Therapy Treatment Patient Details Name: [name] MRN: [id number] DOB: 03/29/1941 Today's Date: 08/04/2021   History of Present Illness Patient is a 81 y/o female who presents on 07/27/21 with left arm trauma after getting LUE stuck in a dresser. Found to have left humerus fx and rhabdomyolosis. s/p fasciotomy, external fixator and wound vac placement 07/27/21. PMH includes dementia, osteoporosis, anxiety, depression, HTN, skin ca, HTN.    PT Comments    Pt received in supine, eager to attempt seated/standing transfers and requesting to use BSC if possible. Pt needing +2 totalA for transfer to EOB from supine and attempted standing from elevated bed height with +2 max to totalA but pt unable to lift hips from EOB at all. Pt tolerated sitting EOB for ~5 mins and emphasis on seated midline posture, RUE self-supporting at bed rail, pursed-lip breathing and supine BLE AA/AROM. Pt unable to actively moved L hand or elbow and OT present to assist with LUE PROM and elevation due to pt multidisciplinary therapy needs and heavy assist level for functional mobility tasks to maintain pt/staff safety. Per chart review, pt may have surgery in next couple of days for LUE. Encouraged pt to continue using bed pan for now due to inability to stand from EOB and with pt significant skin breakdown/blisters between thighs and LUE skin breakdown on medial upper arm along with ex-fix, unsafe to use mechanical lift pad at this time. Pt continues to benefit from PT services to progress toward functional mobility goals.     Recommendations for follow up therapy are one component of a multi-disciplinary discharge planning process, led by the attending physician.  Recommendations may be updated based on patient status, additional functional criteria and insurance authorization.  Follow Up Recommendations  Skilled nursing-short term rehab (<3 hours/day)     Assistance Recommended at Discharge Frequent or constant  Supervision/Assistance  Patient can return home with the following Two people to help with walking and/or transfers;Two people to help with bathing/dressing/bathroom;Assistance with cooking/housework;Assistance with feeding;Direct supervision/assist for medications management;Assist for transportation;Help with stairs or ramp for entrance   Equipment Recommendations  Other (comment) (TBD at SNF)    Recommendations for Other Services       Precautions / Restrictions Precautions Precautions: Fall Precaution Comments: A/AA/PROM ROM as tolerated LUE; multiple blisters BLE adn L UE Required Braces or Orthoses: Other Brace Other Brace: external fixator Restrictions Weight Bearing Restrictions: Yes LUE Weight Bearing: Non weight bearing Other Position/Activity Restrictions: 1/30 per Ortho PA note: No ROM restrictions; Aggressive passive motion of hand, wrist, elbow     Mobility  Bed Mobility Overal bed mobility: Needs Assistance Bed Mobility: Rolling Rolling: Max assist, +2 for physical assistance Sidelying to sit: Total assist, +2 for physical assistance, HOB elevated   Sit to supine: Total assist, +2 for physical assistance (third person assisting PRN as well)   General bed mobility comments: from supine to EOB, max to totalA to scoot BLE toward EOB and totalA for trunk raising and scooting to foot flat; pt unable to self-support trunk initially gradually progressed to self-supporting with RUE on bed rail    Transfers Overall transfer level: Needs assistance   Transfers: Sit to/from Stand             General transfer comment: attempted from elevated air bed height to stand with +2 via face to face and transfer pad assist however pt unable to stand or deweight hips from bed with max to totalA +2, returned to supine afterward as pt  c/o increased fatigue    Ambulation/Gait                   Stairs             Wheelchair Mobility    Modified Rankin (Stroke  Patients Only)       Balance Overall balance assessment: Needs assistance Sitting-balance support: Feet supported, Single extremity supported (RUE supporting) Sitting balance-Leahy Scale:  (Zero with brief instances of Poor balance) Sitting balance - Comments: leans posterior/rt side, able to use RUE on bed rail and progressed to min/modA trunk support briefly but BLE blocked for safety throughout while seated due to cognitive deficit and pt intermittently leaning backward impulsively needs mostly maxA at trunk. Sat EOB ~5 mins Postural control: Posterior lean, Right lateral lean     Standing balance comment: pt unable, only tolerated single attempt but unable to lift hips off bed                            Cognition Arousal/Alertness: Awake/alert Behavior During Therapy: WFL for tasks assessed/performed Overall Cognitive Status: Impaired/Different from baseline Area of Impairment: Safety/judgement, Memory, Awareness, Problem solving                     Memory: Decreased recall of precautions, Decreased short-term memory   Safety/Judgement: Decreased awareness of safety, Decreased awareness of deficits Awareness: Intellectual Problem Solving: Slow processing, Decreased initiation, Difficulty sequencing, Requires verbal cues, Requires tactile cues General Comments: dementia at baseline        Exercises General Exercises - Upper Extremity Elbow Flexion: PROM, Left, 5 reps, Supine General Exercises - Lower Extremity Heel Slides: AAROM, Both, 5 reps, Supine Other Exercises Other Exercises: supine BLE AAROM: ankle pumps (AROM), heel slides, hip abduction, quad sets, glute sets (AROM) x5-10 reps ea    General Comments        Pertinent Vitals/Pain Pain Assessment Pain Assessment: Faces Faces Pain Scale: Hurts little more Pain Location: pt unable to localize, just c/o RUE "fatigue" after sitting EOB ~5 mins and self-supporting with RUE and generalized  discomfort in BLE/RUE Pain Descriptors / Indicators: Grimacing, Sore, Discomfort Pain Intervention(s): Limited activity within patient's tolerance, Monitored during session, Repositioned    Home Living                          Prior Function            PT Goals (current goals can now be found in the care plan section) Acute Rehab PT Goals Patient Stated Goal: to get to [facility] instead of using bed pan when able (unable to safety stand 1/30) PT Goal Formulation: Patient unable to participate in goal setting Time For Goal Achievement: 08/11/21 Progress towards PT goals: Progressing toward goals    Frequency    Min 2X/week      PT Plan Current plan remains appropriate    Co-evaluation PT/OT/SLP Co-Evaluation/Treatment: Yes Reason for Co-Treatment: Complexity of the patient's impairments (multi-system involvement);Necessary to address cognition/behavior during functional activity;For patient/therapist safety;To address functional/ADL transfers PT goals addressed during session: Mobility/safety with mobility;Balance;Strengthening/ROM OT goals addressed during session: ADL's and self-care      AM-PAC PT "6 Clicks" Mobility   Outcome Measure  Help needed turning from your back to your side while in a flat bed without using bedrails?: Total Help needed moving from lying on your back to sitting on the side  of a flat bed without using bedrails?: Total Help needed moving to and from a bed to a chair (including a wheelchair)?: Total Help needed standing up from a chair using your arms (e.g., wheelchair or bedside chair)?: Total Help needed to walk in hospital room?: Total Help needed climbing 3-5 steps with a railing? : Total 6 Click Score: 6    End of Session Equipment Utilized During Treatment: Oxygen Activity Tolerance: Patient limited by fatigue;Patient tolerated treatment well Patient left: in bed;with call bell/phone within reach;with bed alarm set;with SCD's  reapplied;Other (comment) (Prevalon boots donned) Nurse Communication: Mobility status;Need for lift equipment;Other (comment) (dressing changes needed thighs and noted open appearance to wounds/skin on brachial LUE around sutures) PT Visit Diagnosis: Pain;Muscle weakness (generalized) (M62.81);Other abnormalities of gait and mobility (R26.89) Pain - Right/Left: Left Pain - part of body: Shoulder;Arm;Hand;Hip;Knee     Time: 1027-2536 PT Time Calculation (min) (ACUTE ONLY): 41 min  Charges:  $Therapeutic Exercise: 8-22 mins                     Avner Stroder P., PTA Acute Rehabilitation Services Pager: [phone] Office: [place] 08/04/2021, 4:34 PM

## 2021-08-04 NOTE — Progress Notes (Signed)
Occupational Therapy Treatment Patient Details Name: [name] MRN: [id number] DOB: [birth year] Today's Date: 08/04/2021   History of present illness Patient is a 81 y/o female who presents on 07/27/21 with left arm trauma after getting LUE stuck in a dresser. Found to have left humerus fx and rhabdomyolosis. s/p fasciotomy, external fixator and wound vac placement 07/27/21. PMH includes dementia, osteoporosis, anxiety, depression, HTN, skin ca, HTN.   OT comments  Pt seen today for progression of mobility for LUE, wound vac removed. Pt tolerating PROM of hand/digit/wrist/elbow well during session, loose sutures noted and PA aware. Also new wound found on medial side of L arm, possibly from friction from Interdry cloth, RN aware and bandage applied. No skin irritation noted from splint. Pt reporting occasional pain with shoulder flexion. Pt's splint reapplied at end of session, LUE elevated.  Pt max A -total A +2 for bed mobility, sits EOB for ~10 minutes with R arm support prior to fatiguing. Attempted stand from bedside, however pt unable to bear weight through BLE's/lift hips. Pt presenting with impairments listed below, will follow acutely. Continue to recommend SNF at d/c.   Recommendations for follow up therapy are one component of a multi-disciplinary discharge planning process, led by the attending physician.  Recommendations may be updated based on patient status, additional functional criteria and insurance authorization.    Follow Up Recommendations  Skilled nursing-short term rehab (<3 hours/day)    Assistance Recommended at Discharge Frequent or constant Supervision/Assistance  Patient can return home with the following  Two people to help with walking and/or transfers;A lot of help with bathing/dressing/bathroom;Assistance with cooking/housework;Assist for transportation;Help with stairs or ramp for entrance   Equipment Recommendations  BSC/3in1    Recommendations for Other  Services      Precautions / Restrictions Precautions Precautions: Fall Precaution Comments: A/AA/PROM ROM as tolerated LUE; multiple blisters BLE adn L UE Required Braces or Orthoses: Other Brace Other Brace: external fixator Restrictions Weight Bearing Restrictions: Yes LUE Weight Bearing: Non weight bearing Other Position/Activity Restrictions: 1/30 per Ortho PA note: No ROM restrictions; Aggressive passive motion of hand, wrist, elbow       Mobility Bed Mobility Overal bed mobility: Needs Assistance Bed Mobility: Rolling Rolling: Max assist, +2 for physical assistance Sidelying to sit: Total assist, +2 for physical assistance, HOB elevated   Sit to supine: Total assist, +2 for physical assistance        Transfers Overall transfer level: Needs assistance   Transfers: Sit to/from Stand             General transfer comment: unable bear weight through legs/initiate hip extension for standing     Balance Overall balance assessment: Needs assistance Sitting-balance support: Single extremity supported, Feet supported Sitting balance-Leahy Scale: Poor Sitting balance - Comments: leans posterior/rt side Postural control: Posterior lean, Right lateral lean Standing balance support: During functional activity Standing balance-Leahy Scale: Zero Standing balance comment: pt unable                           ADL either performed or assessed with clinical judgement   ADL Overall ADL's : Needs assistance/impaired     Grooming: Wash/dry face;Sitting;Cueing for safety;Cueing for sequencing;Moderate assistance Grooming Details (indicate cue type and reason): unable to balance sitting EOB to use R hand for ADL  Functional mobility during ADLs: Maximal assistance;+2 for physical assistance      Extremity/Trunk Assessment Upper Extremity Assessment Upper Extremity Assessment: LUE deficits/detail RUE Deficits / Details:  generalized weakness LUE Deficits / Details: edema present , tolerates PROM WNL of hand/wrist/digits, reporting some shoulder pain with shoulder flexion LUE Sensation: decreased light touch;decreased proprioception LUE Coordination: decreased fine motor;decreased gross motor   Lower Extremity Assessment Lower Extremity Assessment: Defer to PT evaluation        Vision   Vision Assessment?: No apparent visual deficits   Perception Perception Perception: Not tested   Praxis Praxis Praxis: Not tested    Cognition Arousal/Alertness: Awake/alert Behavior During Therapy: WFL for tasks assessed/performed Overall Cognitive Status: Impaired/Different from baseline Area of Impairment: Safety/judgement                         Safety/Judgement: Decreased awareness of deficits, Decreased awareness of safety   Problem Solving: Difficulty sequencing, Requires verbal cues, Slow processing, Decreased initiation General Comments: pt frequently requesting NSG to assist her to BSC/walk        Exercises Exercises: General Upper Extremity General Exercises - Upper Extremity Shoulder Flexion: PROM, Left, 10 reps, Seated Elbow Flexion: PROM, Left, 10 reps, Seated Elbow Extension: PROM, Left, 10 reps, Seated Wrist Flexion: PROM, Left, 10 reps, Seated Wrist Extension: PROM, Left, 10 reps, Seated Digit Composite Flexion: PROM, Left, 10 reps, Seated Composite Extension: PROM, Left, 10 reps, Seated    Shoulder Instructions       General Comments pt tolerating ROM well, unable to perform active motion at this time. pt with wound vac off this session, wound found on medial side of L upper arm, possibly from friction of interdry? RN notified and bandage applied, will continue to monitor. interdry reapplied to preserve skin integrity    Pertinent Vitals/ Pain       Pain Assessment Pain Assessment: No/denies pain  Home Living                                           Prior Functioning/Environment              Frequency  Min 3X/week        Progress Toward Goals  OT Goals(current goals can now be found in the care plan section)  Progress towards OT goals: Progressing toward goals  Acute Rehab OT Goals Patient Stated Goal: none stated OT Goal Formulation: With patient Time For Goal Achievement: 08/11/21 Potential to Achieve Goals: Good ADL Goals Pt Will Perform Grooming: with min assist;sitting Pt Will Perform Upper Body Dressing: with min assist;sitting Pt Will Transfer to Toilet: with mod assist;with +2 assist;stand pivot transfer Pt/caregiver will Perform Home Exercise Program: Left upper extremity;Increased ROM;With written HEP provided;With minimal assist Additional ADL Goal #1: Pt will tolerate L resting hadn splint for 2 hour intervals to reduce risk of contracture formation Additional ADL Goal #2: Family will demonstrate appropirate positioning to decrease dependent edema L hand/forearm  Plan Discharge plan remains appropriate    Co-evaluation    PT/OT/SLP Co-Evaluation/Treatment: Yes Reason for Co-Treatment: Complexity of the patient's impairments (multi-system involvement);Necessary to address cognition/behavior during functional activity;For patient/therapist safety;To address functional/ADL transfers PT goals addressed during session: Mobility/safety with mobility;Balance;Strengthening/ROM OT goals addressed during session: ADL's and self-care      AM-PAC OT "6 Clicks" Daily Activity  Outcome Measure   Help from another person eating meals?: A Little Help from another person taking care of personal grooming?: A Lot Help from another person toileting, which includes using toliet, bedpan, or urinal?: Total Help from another person bathing (including washing, rinsing, drying)?: Total Help from another person to put on and taking off regular upper body clothing?: Total Help from another person to put on and taking off  regular lower body clothing?: Total 6 Click Score: 9    End of Session Equipment Utilized During Treatment: Gait belt;Oxygen  OT Visit Diagnosis: Unsteadiness on feet (R26.81);Other abnormalities of gait and mobility (R26.89);Muscle weakness (generalized) (M62.81);Other symptoms and signs involving cognitive function   Activity Tolerance Patient tolerated treatment well   Patient Left with SCD's reapplied;in bed;with call bell/phone within reach;with bed alarm set   Nurse Communication Mobility status;Other (comment) (need for bandages on L inner arm and L inner thigh wounds)        Time: 8295-6213 OT Time Calculation (min): 45 min  Charges: OT General Charges $OT Visit: 1 Visit OT Treatments $Therapeutic Activity: 8-22 mins $Therapeutic Exercise: 8-22 mins  Lynnda Child, OTD, OTR/L Acute Rehab [phone]) [phone] - [place] 08/04/2021, 5:13 PM

## 2021-08-04 NOTE — Progress Notes (Signed)
Orthopaedic Trauma Service Progress Note  Patient ID: [name] MRN: [id number] DOB/AGE: [birth year] 81 y.o.  Subjective:  No acute issues  Ortho issues stable    ROS As above  Objective:   VITALS:   Vitals:   08/03/21 2100 08/03/21 2200 08/04/21 0314 08/04/21 0845  BP: 132/82 (!) 142/80 137/76 (!) 162/80  Pulse: 95 99 90   Resp: 13 18 19    Temp:   98.3 F (36.8 C)   TempSrc:   Oral   SpO2: 97% 95% 99% 99%  Weight:      Height:        Estimated body mass index is 34.44 kg/m as calculated from the following:   Height as of this encounter: 5' (1.524 m).   Weight as of this encounter: 80 kg.   Intake/Output      01/29 0701 01/30 0700 01/30 0701 01/31 0700   P.O. 237    I.V. (mL/kg) 1785.9 (22.3)    Total Intake(mL/kg) 2022.9 (25.3)    Urine (mL/kg/hr) 1550 (0.8)    Drains 150    Total Output 1700    Net +322.9           LABS  No results found for this or any previous visit (from the past 24 hour(s)).   PHYSICAL EXAM:   Gen: sitting up in bed, NAD, son at bedside  Lungs: unlabored  Ext:       Left Upper Extremity              Ex fix L upper arm stable             Serosanguinous drainage but stable    Proximal pinsites look good   Distal pinsites with some erythema but serous drainage              Ace and wound vac intact   Vac removed   All wounds are stable, no evidence of infection to wound on forearm    Flap volar forearm is stable but tenuous   Mid aspect of volar flap has some dusky tissue noted    Not grossly necrotic but ? Viability    Ext warm    Good perfusion distally              No motor or sensory noted to hand or wrist                Assessment/Plan: 6 Days Post-Op   Principal Problem:   Left humeral fracture Active Problems:   Essential hypertension   Traumatic rhabdomyolysis (HCC)   Alzheimer disease (HCC)   Leukocytosis   Anemia   Thoracic  ascending aortic aneurysm   Hyperlipidemia   Anti-infectives (From admission, onward)    Start     Dose/Rate Route Frequency Ordered Stop   07/30/21 0330  ceFAZolin (ANCEF) IVPB 2g/100 mL premix        2 g 200 mL/hr over 30 Minutes Intravenous Every 12 hours 07/29/21 1716 07/30/21 0354   07/29/21 1630  ceFAZolin (ANCEF) IVPB 2g/100 mL premix  Status:  Discontinued        2 g 200 mL/hr over 30 Minutes Intravenous Every 8 hours 07/29/21 1250 07/29/21 1716   07/29/21 0700  ceFAZolin (ANCEF) IVPB 2g/100 mL premix        2  g 200 mL/hr over 30 Minutes Intravenous  Once 07/28/21 2155 07/29/21 0838   07/28/21 0600  ceFAZolin (ANCEF) IVPB 2g/100 mL premix  Status:  Discontinued        2 g 200 mL/hr over 30 Minutes Intravenous On call to O.R. 07/27/21 2313 07/28/21 0012   07/28/21 0020  ceFAZolin (ANCEF) IVPB 2g/100 mL premix        2 g 200 mL/hr over 30 Minutes Intravenous Every 6 hours 07/28/21 0020 07/29/21 0029   07/27/21 2201  vancomycin (VANCOCIN) powder  Status:  Discontinued          As needed 07/27/21 2202 07/27/21 2234   07/27/21 1957  ceFAZolin (ANCEF) 2-4 GM/100ML-% IVPB       Note to Pharmacy: Ubaldo Glassing M: cabinet override      07/27/21 1957 07/28/21 2694     .  POD/HD#: 6 (POD 8 from ex fix)  81 y/o female with L humeral shaft fracture and L forearm compartment syndrome    -closed L humerus fracture s/p Ex fix             NWB L uex             repeat xray of L humerus to assess overall alignment    Subsequent surgery may put soft tissue of forearm at risk   Elevated risk of infection with or without surgery. Also nonunion risk as well    Start duricef and local pin care with soap and water or mild irritation and distal pinsites    - L forearm compartment syndrome likely due to prolonged external compression from mechanism of injury s/p fasciotomies and I&D             wounds stable but tenuous    Adaptic, 4x4, kerlix and ace applied    Reinforce as needed               Fasciotomies closed                Custom brace made by OT to help maintain position of function/minimize sequela of contractures                           Extensive debridement of volar forearm compartment                          Muscles of dorsal compartment noncontractile but had good color                No ROM restrictions                          Aggressive passive motion of hand, wrist, elbow   Prognosis for left upper extremity remains guarded                 - Pain management:             continue with current regimen    - ABL anemia/Hemodynamics             stabilizing    - Medical issues              Per primary                          AKI  Due to rhabdo     Improving    - DVT/PE prophylaxis:             Scds             lovenox    - ID:              Periop abx completed    - Dispo:             Continue with inpatient care              still discussing ORIF vs definitive tx in ex fix                 Jari Pigg, PA-C [phone] (C) 08/04/2021, 11:06 AM  [facility] [place] [phone] Jenetta [phone] (F)    After 5pm and on the weekends please log on to Amion, go to orthopaedics and the look under the [facility] Call for the provider(s) on call. You can also call our office at [phone] and then follow the prompts to be connected to the call team.   Patient ID: [name], female   DOB: Dec 21, 1940, 81 y.o.   MRN: [id number]

## 2021-08-04 NOTE — Progress Notes (Signed)
PROGRESS NOTE    [name]  SFK:[id number] DOB: Oct 16, 1940 DOA: 07/27/2021 PCP: Orpah Melter, MD   Brief Narrative:  [name] is a 81 y.o. female with medical history significant of HTN, alzheimers dementia, anemia,  anxiety and depression who presents to ED after fall with subsequent left arm trauma. History is from her son due to her dementia. Ortho consulted given compartment syndrome/humerus fracture on L side.  1/23 status post surgery on 07/27/2021.  Hemoglobin 6.0, getting 2 units of packed red blood cell transfusion this a.m. 1/24 s/p ORIF for repeat evaluation of left forearm status post fasciotomies possible debridement of nonviable necrotic muscle, possible wound closure today by orthopedics.  FOB positive, creatinine up 1/25 pain well controlled attempting to ambulate with PT later today if possible -has been difficult given her walker bound status with left arm fracture 1/26 ortho to re-evaluate left arm compartment syndrome 1/27-1/30 external fixation remains intact awaiting ORIF per their expertise likely 1/30-31 pending resolution of distal edema/swelling.  Assessment & Plan:   Left humeral fracture- (present on admission) S/p fasciotomy Lt forearm, external fixation Lt humerus, wound vac application on 1/74 9/44 s/p OR for compartment closure -tolerated well Lovenox per orthopedics PT/OT ongoing, likely disposition to SNF given walker bound status and inability to use walker given left arm fracture (currently nonweightbearing per Ortho).   Traumatic rhabdomyolysis ([place])- (present on admission) improving AKI, decreased urine output Concurrent hyperkalemia -Ck trending down appropriately -Continue low rate IV fluids given poor p.o. intake perioperatively today -Creatinine continues to trend upwards, urine output low overnight but improving this morning -Potassium minimally elevated, should correct with increased urine output   Acute blood loss anemia due to  surgery Chronic anemia, iron deficiency Questionable GI bleed - Hemoglobin continues to drop slowly in the setting of above - Wound vac continues to put out blood tinged fluid - Fobt positive -follow clinically - GI consulted given ongoing drop in Hgb per son's wishes but patient and son are not agreeable for endoscopy - Hold lovenox   Essential hypertension- (present on admission) BP stable continue to monitor   Alzheimer disease ([place])- (present on admission) At baseline, delirium precautions    Thoracic ascending aortic aneurysm- (present on admission) Atypical chest pain -Transient atypical chest pain with negative cardiac work-up including EKG Noted and follows outpatient.   Hyperlipidemia Holding statin due to rhabdomyolysis   Hyponatremia Mild, chronic, no indication for treatment   DVT prophylaxis: scd, hold lovenox due to ongoing anemia and questionable blood loss Code Status: DNR -confirmed with son at bedside, reviewed living well which indicates "patient would want a natural death without any heroic measures or artificial life support/nutrition" Family Communication: Son updated at bedside  Status is: Inpatient  Dispo: The patient is from: Home              Anticipated d/c is to: SNF              Anticipated d/c date is: 72+ hours              Patient currently not medically stable for discharge  Consultants:  Orthopedic surgery  Procedures:  External fixation left humerus Compartment syndrome evaluation and debridement of distal L arm ORIF pending  Antimicrobials:  None  Subjective: No acute issues or events overnight, patient reports transient episode of chest pain this morning at shift change, unfortunately due to patient's baseline mental status she is unable to further clarify or describe the pain states that it is "  everywhere" and when asked if it hurts in her legs and head and arms she agreed.   Objective: Vitals:   08/03/21 2047 08/03/21 2100  08/03/21 2200 08/04/21 0314  BP: 129/72 132/82 (!) 142/80 137/76  Pulse: 95 95 99 90  Resp: 20 13 18 19   Temp: 98.3 F (36.8 C)   98.3 F (36.8 C)  TempSrc: Oral   Oral  SpO2: 97% 97% 95% 99%  Weight:      Height:        Intake/Output Summary (Last 24 hours) at 08/04/2021 0755 Last data filed at 08/04/2021 0416 Gross per 24 hour  Intake 2022.88 ml  Output 1700 ml  Net 322.88 ml    Filed Weights   07/29/21 0004 07/29/21 0754  Weight: 80 kg 80 kg    Examination:  General:  Pleasantly resting in bed, No acute distress. HEENT:  Normocephalic atraumatic.  Sclerae nonicteric, noninjected.  Extraocular movements intact bilaterally. Neck:  Without mass or deformity.  Trachea is midline. Lungs:  Clear to auscultate bilaterally without rhonchi, wheeze, or rales. Heart:  Regular rate and rhythm.  Without murmurs, rubs, or gallops. Abdomen:  Soft, nontender, nondistended.  Without guarding or rebound. Extremities: LUE external fixation intact - distal bandage clean/dry   Data Reviewed: I have personally reviewed following labs and imaging studies  CBC: Recent Labs  Lab 07/30/21 0218 07/30/21 1451 07/31/21 0432 08/01/21 0134 08/02/21 0441 08/03/21 0556  WBC 10.2  --  8.8 8.1 8.5 10.4  HGB 7.1* 6.8* 7.6* 7.8* 7.6* 7.9*  HCT 23.0* 21.7* 23.4* 24.2* 25.0* 25.3*  MCV 90.9  --  89.7 92.0 92.9 93.4  PLT 332  --  304 325 307 941    Basic Metabolic Panel: Recent Labs  Lab 07/30/21 0218 07/31/21 0432 08/01/21 0134 08/02/21 0441 08/03/21 0556  NA 133* 132* 131* 135 137  K 5.3* 4.8 4.7 3.9 3.9  CL 103 105 102 107 109  CO2 16* 19* 21* 21* 20*  GLUCOSE 112* 107* 111* 106* 147*  BUN 50* 58* 59* 49* 40*  CREATININE 2.85* 2.91* 2.71* 1.91* 1.54*  CALCIUM 7.5* 7.6* 7.8* 7.7* 7.7*    GFR: Estimated Creatinine Clearance: 27.3 mL/min (A) (by C-G formula based on SCr of 1.54 mg/dL (H)). Liver Function Tests: No results for input(s): AST, ALT, ALKPHOS, BILITOT, PROT, ALBUMIN in  the last 168 hours. No results for input(s): LIPASE, AMYLASE in the last 168 hours. No results for input(s): AMMONIA in the last 168 hours. Coagulation Profile: No results for input(s): INR, PROTIME in the last 168 hours. Cardiac Enzymes: Recent Labs  Lab 07/29/21 0306 07/30/21 0218 07/31/21 0432 08/01/21 0134  CKTOTAL 8,090* 4,252* 2,840* 2,016*    BNP (last 3 results) No results for input(s): PROBNP in the last 8760 hours. HbA1C: No results for input(s): HGBA1C in the last 72 hours. CBG: No results for input(s): GLUCAP in the last 168 hours. Lipid Profile: No results for input(s): CHOL, HDL, LDLCALC, TRIG, CHOLHDL, LDLDIRECT in the last 72 hours. Thyroid Function Tests: No results for input(s): TSH, T4TOTAL, FREET4, T3FREE, THYROIDAB in the last 72 hours. Anemia Panel: No results for input(s): VITAMINB12, FOLATE, FERRITIN, TIBC, IRON, RETICCTPCT in the last 72 hours.  Sepsis Labs: No results for input(s): PROCALCITON, LATICACIDVEN in the last 168 hours.  Recent Results (from the past 240 hour(s))  Resp Panel by RT-PCR (Flu A&B, Covid) Nasopharyngeal Swab     Status: None   Collection Time: 07/27/21 11:49 AM   Specimen: Nasopharyngeal  Swab; Nasopharyngeal(NP) swabs in vial transport medium  Result Value Ref Range Status   SARS Coronavirus 2 by RT PCR NEGATIVE NEGATIVE Final    Comment: (NOTE) SARS-CoV-2 target nucleic acids are NOT DETECTED.  The SARS-CoV-2 RNA is generally detectable in upper respiratory specimens during the acute phase of infection. The lowest concentration of SARS-CoV-2 viral copies this assay can detect is 138 copies/mL. A negative result does not preclude SARS-Cov-2 infection and should not be used as the sole basis for treatment or other patient management decisions. A negative result may occur with  improper specimen collection/handling, submission of specimen other than nasopharyngeal swab, presence of viral mutation(s) within the areas  targeted by this assay, and inadequate number of viral copies(<138 copies/mL). A negative result must be combined with clinical observations, patient history, and epidemiological information. The expected result is Negative.  Fact Sheet for Patients:  EntrepreneurPulse.com.au  Fact Sheet for Healthcare Providers:  IncredibleEmployment.be  This test is no t yet approved or cleared by the [place] FDA and  has been authorized for detection and/or diagnosis of SARS-CoV-2 by FDA under an Emergency Use Authorization (EUA). This EUA will remain  in effect (meaning this test can be used) for the duration of the COVID-19 declaration under Section 564(b)(1) of the Act, 21 U.S.C.section 360bbb-3(b)(1), unless the authorization is terminated  or revoked sooner.       Influenza A by PCR NEGATIVE NEGATIVE Final   Influenza B by PCR NEGATIVE NEGATIVE Final    Comment: (NOTE) The Xpert Xpress SARS-CoV-2/FLU/RSV plus assay is intended as an aid in the diagnosis of influenza from Nasopharyngeal swab specimens and should not be used as a sole basis for treatment. Nasal washings and aspirates are unacceptable for Xpert Xpress SARS-CoV-2/FLU/RSV testing.  Fact Sheet for Patients: EntrepreneurPulse.com.au  Fact Sheet for Healthcare Providers: IncredibleEmployment.be  This test is not yet approved or cleared by the [place] FDA and has been authorized for detection and/or diagnosis of SARS-CoV-2 by FDA under an Emergency Use Authorization (EUA). This EUA will remain in effect (meaning this test can be used) for the duration of the COVID-19 declaration under Section 564(b)(1) of the Act, 21 U.S.C. section 360bbb-3(b)(1), unless the authorization is terminated or revoked.  Performed at [facility], [place] [address], [place], [place] [address]    Radiology Studies: No results found.  Scheduled Meds:   sodium chloride   Intravenous Once   acetaminophen  650 mg Oral Q8H   Chlorhexidine Gluconate Cloth  6 each Topical Daily   docusate sodium  100 mg Oral BID   feeding supplement  1 Container Oral TID BM   mouth rinse  15 mL Mouth Rinse BID   pantoprazole  40 mg Oral BID AC   senna  1 tablet Oral BID   sodium zirconium cyclosilicate  10 g Oral Once   Continuous Infusions:  sodium chloride 75 mL/hr at 08/04/21 0413   sodium chloride      LOS: 8 days   Time spent: 64min  Mercia Dowe C Tajia Szeliga, DO [facility]  If 7PM-7AM, please contact night-coverage [email or web address]  08/04/2021, 7:55 AM

## 2021-08-04 NOTE — Plan of Care (Signed)

## 2021-08-05 ENCOUNTER — Encounter (HOSPITAL_COMMUNITY): Payer: Self-pay | Admitting: Family Medicine

## 2021-08-05 ENCOUNTER — Inpatient Hospital Stay (HOSPITAL_COMMUNITY): Payer: Medicare PPO | Admitting: Certified Registered Nurse Anesthetist

## 2021-08-05 ENCOUNTER — Inpatient Hospital Stay (HOSPITAL_COMMUNITY): Payer: Medicare PPO

## 2021-08-05 ENCOUNTER — Encounter (HOSPITAL_COMMUNITY)
Admission: EM | Disposition: A | Discharge status: Skilled Nursing Facility | Payer: Self-pay | Source: Home / Self Care | Attending: Internal Medicine

## 2021-08-05 HISTORY — PX: EXTERNAL FIXATION REMOVAL: SHX5040

## 2021-08-05 HISTORY — PX: ORIF HUMERUS FRACTURE: SHX2126

## 2021-08-05 LAB — CBC
HCT: 25.4 % — ABNORMAL LOW (ref 36.0–46.0)
Hemoglobin: 7.8 g/dL — ABNORMAL LOW (ref 12.0–15.0)
MCH: 29.2 pg (ref 26.0–34.0)
MCHC: 30.7 g/dL (ref 30.0–36.0)
MCV: 95.1 fL (ref 80.0–100.0)
Platelets: 288 10*3/uL (ref 150–400)
RBC: 2.67 MIL/uL — ABNORMAL LOW (ref 3.87–5.11)
RDW: 19.7 % — ABNORMAL HIGH (ref 11.5–15.5)
WBC: 8.4 10*3/uL (ref 4.0–10.5)
nRBC: 0.7 % — ABNORMAL HIGH (ref 0.0–0.2)

## 2021-08-05 LAB — BASIC METABOLIC PANEL
Anion gap: 8 (ref 5–15)
Anion gap: 6 (ref 5–15)
BUN: 23 mg/dL (ref 8–23)
BUN: 22 mg/dL (ref 8–23)
CO2: 21 mmol/L — ABNORMAL LOW (ref 22–32)
CO2: 24 mmol/L (ref 22–32)
Calcium: 8 mg/dL — ABNORMAL LOW (ref 8.9–10.3)
Calcium: 8 mg/dL — ABNORMAL LOW (ref 8.9–10.3)
Chloride: 110 mmol/L (ref 98–111)
Chloride: 111 mmol/L (ref 98–111)
Creatinine, Ser: 0.7 mg/dL (ref 0.44–1.00)
Creatinine, Ser: 0.71 mg/dL (ref 0.44–1.00)
GFR, Estimated: >60 mL/min (ref >60)
GFR, Estimated: >60 mL/min (ref >60)
Glucose, Bld: 104 mg/dL — ABNORMAL HIGH (ref 70–99)
Glucose, Bld: 103 mg/dL — ABNORMAL HIGH (ref 70–99)
Potassium: 3.2 mmol/L — ABNORMAL LOW (ref 3.5–5.1)
Potassium: 3.3 mmol/L — ABNORMAL LOW (ref 3.5–5.1)
Sodium: 139 mmol/L (ref 135–145)
Sodium: 141 mmol/L (ref 135–145)

## 2021-08-05 LAB — MAGNESIUM: Magnesium: 1.2 mg/dL — ABNORMAL LOW (ref 1.7–2.4)

## 2021-08-05 LAB — PREPARE RBC (CROSSMATCH)

## 2021-08-05 SURGERY — OPEN REDUCTION INTERNAL FIXATION (ORIF) HUMERAL SHAFT FRACTURE
Anesthesia: General | Laterality: Left

## 2021-08-05 MED ORDER — CHLORHEXIDINE GLUCONATE 0.12 % MT SOLN
OROMUCOSAL | Status: AC
  Administered 2021-08-05: 15 mL via OROMUCOSAL
  Filled 2021-08-05: qty 15

## 2021-08-05 MED ORDER — PHENYLEPHRINE HCL-NACL 20-0.9 MG/250ML-% IV SOLN
INTRAVENOUS | Status: DC | PRN
  Administered 2021-08-05: 40 ug/min via INTRAVENOUS

## 2021-08-05 MED ORDER — CHLORHEXIDINE GLUCONATE 0.12 % MT SOLN: 15.0000 mL | Freq: Once | OROMUCOSAL | Status: AC

## 2021-08-05 MED ORDER — FENTANYL CITRATE (PF) 250 MCG/5ML IJ SOLN
INTRAMUSCULAR | Status: AC
  Filled 2021-08-05: qty 5

## 2021-08-05 MED ORDER — ASCORBIC ACID 500 MG PO TABS
1000.0000 mg | ORAL_TABLET | Freq: Every day | ORAL | Status: DC
  Administered 2021-08-05 – 2021-08-07 (×3): 1000 mg via ORAL
  Filled 2021-08-05 (×3): qty 2

## 2021-08-05 MED ORDER — ROCURONIUM BROMIDE 10 MG/ML (PF) SYRINGE
PREFILLED_SYRINGE | INTRAVENOUS | Status: AC
  Filled 2021-08-05: qty 10

## 2021-08-05 MED ORDER — PROPOFOL 10 MG/ML IV BOLUS
INTRAVENOUS | Status: DC | PRN
  Administered 2021-08-05: 100 mg via INTRAVENOUS

## 2021-08-05 MED ORDER — FENTANYL CITRATE (PF) 100 MCG/2ML IJ SOLN: 25.0000 ug | INTRAMUSCULAR | Status: DC | PRN

## 2021-08-05 MED ORDER — SODIUM CHLORIDE 0.9 % IV SOLN: INTRAVENOUS | Status: DC | PRN

## 2021-08-05 MED ORDER — ORAL CARE MOUTH RINSE: 15.0000 mL | Freq: Once | OROMUCOSAL | Status: AC

## 2021-08-05 MED ORDER — SODIUM CHLORIDE 0.9% IV SOLUTION: Freq: Once | INTRAVENOUS | Status: DC

## 2021-08-05 MED ORDER — FENTANYL CITRATE (PF) 250 MCG/5ML IJ SOLN
INTRAMUSCULAR | Status: DC | PRN
  Administered 2021-08-05: 50 ug via INTRAVENOUS
  Administered 2021-08-05: 100 ug via INTRAVENOUS

## 2021-08-05 MED ORDER — PROPOFOL 10 MG/ML IV BOLUS
INTRAVENOUS | Status: AC
  Filled 2021-08-05: qty 20

## 2021-08-05 MED ORDER — 0.9 % SODIUM CHLORIDE (POUR BTL) OPTIME
TOPICAL | Status: DC | PRN
  Administered 2021-08-05: 1000 mL

## 2021-08-05 MED ORDER — LACTATED RINGERS IV SOLN: INTRAVENOUS | Status: DC

## 2021-08-05 MED ORDER — ONDANSETRON HCL 4 MG/2ML IJ SOLN
INTRAMUSCULAR | Status: AC
  Filled 2021-08-05: qty 2

## 2021-08-05 MED ORDER — LIDOCAINE 2% (20 MG/ML) 5 ML SYRINGE
INTRAMUSCULAR | Status: AC
  Filled 2021-08-05: qty 5

## 2021-08-05 MED ORDER — ONDANSETRON HCL 4 MG/2ML IJ SOLN
INTRAMUSCULAR | Status: DC | PRN
  Administered 2021-08-05: 4 mg via INTRAVENOUS

## 2021-08-05 MED ORDER — ROCURONIUM BROMIDE 10 MG/ML (PF) SYRINGE
PREFILLED_SYRINGE | INTRAVENOUS | Status: DC | PRN
Start: 2021-08-05 — End: 2021-08-05
  Administered 2021-08-05: 40 mg via INTRAVENOUS
  Administered 2021-08-05: 60 mg via INTRAVENOUS

## 2021-08-05 MED ORDER — DEXAMETHASONE SODIUM PHOSPHATE 10 MG/ML IJ SOLN
INTRAMUSCULAR | Status: AC
  Filled 2021-08-05: qty 1

## 2021-08-05 MED ORDER — CEFAZOLIN IN SODIUM CHLORIDE 3-0.9 GM/100ML-% IV SOLN
3.0000 g | Freq: Once | INTRAVENOUS | Status: AC
  Administered 2021-08-05: 3 g via INTRAVENOUS

## 2021-08-05 MED ORDER — ZINC SULFATE 220 (50 ZN) MG PO CAPS
220.0000 mg | ORAL_CAPSULE | Freq: Every day | ORAL | Status: DC
  Administered 2021-08-05 – 2021-08-07 (×3): 220 mg via ORAL
  Filled 2021-08-05 (×3): qty 1

## 2021-08-05 MED ORDER — PHENYLEPHRINE 40 MCG/ML (10ML) SYRINGE FOR IV PUSH (FOR BLOOD PRESSURE SUPPORT)
PREFILLED_SYRINGE | INTRAVENOUS | Status: DC | PRN
  Administered 2021-08-05: 160 ug via INTRAVENOUS
  Administered 2021-08-05: 80 ug via INTRAVENOUS
  Administered 2021-08-05: 160 ug via INTRAVENOUS

## 2021-08-05 MED ORDER — POTASSIUM CHLORIDE CRYS ER 20 MEQ PO TBCR
40.0000 meq | EXTENDED_RELEASE_TABLET | Freq: Two times a day (BID) | ORAL | Status: AC
  Administered 2021-08-05 (×2): 40 meq via ORAL
  Filled 2021-08-05 (×2): qty 2

## 2021-08-05 MED ORDER — SUGAMMADEX SODIUM 200 MG/2ML IV SOLN
INTRAVENOUS | Status: DC | PRN
  Administered 2021-08-05 (×2): 200 mg via INTRAVENOUS

## 2021-08-05 MED ORDER — LIDOCAINE 2% (20 MG/ML) 5 ML SYRINGE
INTRAMUSCULAR | Status: DC | PRN
Start: 2021-08-05 — End: 2021-08-05
  Administered 2021-08-05: 60 mg via INTRAVENOUS

## 2021-08-05 MED ORDER — CEFAZOLIN IN SODIUM CHLORIDE 3-0.9 GM/100ML-% IV SOLN
INTRAVENOUS | Status: AC
  Filled 2021-08-05: qty 100

## 2021-08-05 MED ORDER — CEFAZOLIN SODIUM-DEXTROSE 2-4 GM/100ML-% IV SOLN
2.0000 g | Freq: Three times a day (TID) | INTRAVENOUS | Status: AC
  Administered 2021-08-05 – 2021-08-06 (×3): 2 g via INTRAVENOUS
  Filled 2021-08-05 (×3): qty 100

## 2021-08-05 MED ORDER — DEXAMETHASONE SODIUM PHOSPHATE 10 MG/ML IJ SOLN
INTRAMUSCULAR | Status: DC | PRN
  Administered 2021-08-05: 10 mg via INTRAVENOUS

## 2021-08-05 MED ORDER — PHENYLEPHRINE 40 MCG/ML (10ML) SYRINGE FOR IV PUSH (FOR BLOOD PRESSURE SUPPORT)
PREFILLED_SYRINGE | INTRAVENOUS | Status: AC
  Filled 2021-08-05: qty 10

## 2021-08-05 SURGICAL SUPPLY — 71 items
APL SKNCLS STERI-STRIP NONHPOA (GAUZE/BANDAGES/DRESSINGS) ×2
BAG COUNTER SPONGE SURGICOUNT (BAG) ×2 IMPLANT
BAG SPNG CNTER NS LX DISP (BAG) ×1
BENZOIN TINCTURE PRP APPL 2/3 (GAUZE/BANDAGES/DRESSINGS) ×4 IMPLANT
BIT DRILL 10 HOLLOW (BIT) ×1 IMPLANT
BIT DRILL 3.8X270 (BIT) ×1 IMPLANT
BIT DRILL SHORT 3.2MM (DRILL) IMPLANT
BNDG CMPR 9X4 STRL LF SNTH (GAUZE/BANDAGES/DRESSINGS) ×1
BNDG ELASTIC 3X5.8 VLCR STR LF (GAUZE/BANDAGES/DRESSINGS) ×1 IMPLANT
BNDG ESMARK 4X9 LF (GAUZE/BANDAGES/DRESSINGS) ×2 IMPLANT
BNDG GAUZE ELAST 4 BULKY (GAUZE/BANDAGES/DRESSINGS) ×3 IMPLANT
BRUSH SCRUB EZ PLAIN DRY (MISCELLANEOUS) ×4 IMPLANT
CORD BIPOLAR FORCEPS 12FT (ELECTRODE) ×2 IMPLANT
COVER SURGICAL LIGHT HANDLE (MISCELLANEOUS) ×4 IMPLANT
DRAPE C-ARM 42X72 X-RAY (DRAPES) ×2 IMPLANT
DRAPE ORTHO SPLIT 77X108 STRL (DRAPES) ×4
DRAPE SURG ORHT 6 SPLT 77X108 (DRAPES) ×2 IMPLANT
DRAPE U-SHAPE 47X51 STRL (DRAPES) ×4 IMPLANT
DRILL SHORT 3.2MM (DRILL) ×4
DRSG ADAPTIC 3X8 NADH LF (GAUZE/BANDAGES/DRESSINGS) ×2 IMPLANT
DRSG MEPITEL 8X12 (GAUZE/BANDAGES/DRESSINGS) ×1 IMPLANT
DRSG PAD ABDOMINAL 8X10 ST (GAUZE/BANDAGES/DRESSINGS) ×2 IMPLANT
ELECT REM PT RETURN 9FT ADLT (ELECTROSURGICAL) ×2
ELECTRODE REM PT RTRN 9FT ADLT (ELECTROSURGICAL) ×1 IMPLANT
EVACUATOR 1/8 PVC DRAIN (DRAIN) IMPLANT
GAUZE SPONGE 4X4 12PLY STRL (GAUZE/BANDAGES/DRESSINGS) ×4 IMPLANT
GAUZE SPONGE 4X4 12PLY STRL LF (GAUZE/BANDAGES/DRESSINGS) ×1 IMPLANT
GLOVE SRG 8 PF TXTR STRL LF DI (GLOVE) ×1 IMPLANT
GLOVE SURG ENC MOIS LTX SZ8 (GLOVE) ×2 IMPLANT
GLOVE SURG ORTHO LTX SZ7.5 (GLOVE) ×4 IMPLANT
GLOVE SURG UNDER POLY LF SZ7.5 (GLOVE) ×2 IMPLANT
GLOVE SURG UNDER POLY LF SZ8 (GLOVE) ×2
GOWN STRL REUS W/ TWL LRG LVL3 (GOWN DISPOSABLE) ×2 IMPLANT
GOWN STRL REUS W/ TWL XL LVL3 (GOWN DISPOSABLE) ×1 IMPLANT
GOWN STRL REUS W/TWL LRG LVL3 (GOWN DISPOSABLE) ×4
GOWN STRL REUS W/TWL XL LVL3 (GOWN DISPOSABLE) ×2
GUIDEROD SS 2.5MMX280MM (ROD) ×1 IMPLANT
KIT BASIN OR (CUSTOM PROCEDURE TRAY) ×2 IMPLANT
KIT TURNOVER KIT B (KITS) ×2 IMPLANT
MANIFOLD NEPTUNE II (INSTRUMENTS) ×2 IMPLANT
NAIL CANN IM ML 8.5X240 LT (Nail) ×1 IMPLANT
NDL HYPO 25X1 1.5 SAFETY (NEEDLE) ×1 IMPLANT
NEEDLE HYPO 25X1 1.5 SAFETY (NEEDLE) ×2 IMPLANT
NS IRRIG 1000ML POUR BTL (IV SOLUTION) ×2 IMPLANT
PACK ORTHO EXTREMITY (CUSTOM PROCEDURE TRAY) ×2 IMPLANT
PAD ABD 8X10 STRL (GAUZE/BANDAGES/DRESSINGS) ×1 IMPLANT
PAD ARMBOARD 7.5X6 YLW CONV (MISCELLANEOUS) ×4 IMPLANT
ROD REAMING 2.5 (INSTRUMENTS) ×1 IMPLANT
SCREW LOCK 4.5X36 TI FT (Screw) ×2 IMPLANT
SCREW LOCK 4.5X40 (Screw) ×2 IMPLANT
SCREW LOCK 4X22 TI (Screw) ×1 IMPLANT
SCREW LOCK 4X24 TI (Screw) ×1 IMPLANT
SCREW LOCK FT 40X4.5X3.9XSLF (Screw) IMPLANT
SPONGE T-LAP 18X18 ~~LOC~~+RFID (SPONGE) ×2 IMPLANT
STAPLER VISISTAT 35W (STAPLE) ×2 IMPLANT
SUCTION FRAZIER HANDLE 10FR (MISCELLANEOUS) ×2
SUCTION TUBE FRAZIER 10FR DISP (MISCELLANEOUS) ×1 IMPLANT
SUT ETHILON 2 0 FS 18 (SUTURE) ×4 IMPLANT
SUT PDS AB 2-0 CT1 27 (SUTURE) IMPLANT
SUT VIC AB 0 CT1 27 (SUTURE) ×4
SUT VIC AB 0 CT1 27XBRD ANBCTR (SUTURE) ×2 IMPLANT
SUT VIC AB 2-0 CT1 27 (SUTURE) ×4
SUT VIC AB 2-0 CT1 TAPERPNT 27 (SUTURE) ×2 IMPLANT
SYR 5ML LL (SYRINGE) IMPLANT
SYR CONTROL 10ML LL (SYRINGE) ×2 IMPLANT
TOWEL GREEN STERILE (TOWEL DISPOSABLE) ×6 IMPLANT
TOWEL GREEN STERILE FF (TOWEL DISPOSABLE) ×2 IMPLANT
TRAY FOLEY MTR SLVR 16FR STAT (SET/KITS/TRAYS/PACK) IMPLANT
TUBE CONNECTING 12X1/4 (SUCTIONS) ×2 IMPLANT
WATER STERILE IRR 1000ML POUR (IV SOLUTION) ×2 IMPLANT
YANKAUER SUCT BULB TIP NO VENT (SUCTIONS) IMPLANT

## 2021-08-05 NOTE — Plan of Care (Signed)
°  Problem: Clinical Measurements: Goal: Respiratory complications will improve Outcome: Progressing   Problem: Nutrition: Goal: Adequate nutrition will be maintained Outcome: Progressing   Problem: Coping: Goal: Level of anxiety will decrease Outcome: Progressing   

## 2021-08-05 NOTE — Anesthesia Preprocedure Evaluation (Addendum)
Anesthesia Evaluation  Patient identified by MRN, date of birth, ID band Patient awake    Reviewed: Allergy & Precautions, NPO status , Patient's Chart, lab work & pertinent test results  Airway Mallampati: II  TM Distance: >3 FB Neck ROM: Full    Dental no notable dental hx.    Pulmonary neg pulmonary ROS,    Pulmonary exam normal        Cardiovascular hypertension, Pt. on medications and Pt. on home beta blockers  Rhythm:Regular Rate:Normal     Neuro/Psych  Headaches, Anxiety Depression Dementia    GI/Hepatic Neg liver ROS, GERD  ,  Endo/Other  negative endocrine ROS  Renal/GU negative Renal ROS  negative genitourinary   Musculoskeletal  (+) Arthritis , Osteoarthritis,  Left humerus fx   Abdominal Normal abdominal exam  (+)   Peds  Hematology  (+) anemia ,   Anesthesia Other Findings   Reproductive/Obstetrics                            Anesthesia Physical Anesthesia Plan  ASA: 3  Anesthesia Plan: General   Post-op Pain Management:    Induction: Intravenous  PONV Risk Score and Plan: 3 and Ondansetron, Dexamethasone and Treatment may vary due to age or medical condition  Airway Management Planned: Mask and Oral ETT  Additional Equipment: None  Intra-op Plan:   Post-operative Plan: Extubation in OR  Informed Consent: I have reviewed the patients History and Physical, chart, labs and discussed the procedure including the risks, benefits and alternatives for the proposed anesthesia with the patient or authorized representative who has indicated his/her understanding and acceptance.   Patient has DNR.   Dental advisory given  Plan Discussed with: CRNA  Anesthesia Plan Comments: (Lab Results      Component                Value               Date                      WBC                      8.4                 08/05/2021                HGB                      7.8 (L)              08/05/2021                HCT                      25.4 (L)            08/05/2021                MCV                      95.1                08/05/2021                PLT  288                 08/05/2021           Lab Results      Component                Value               Date                      NA                       141                 08/05/2021                K                        3.3 (L)             08/05/2021                CO2                      24                  08/05/2021                GLUCOSE                  103 (H)             08/05/2021                BUN                      22                  08/05/2021                CREATININE               0.71                08/05/2021                CALCIUM                  8.0 (L)             08/05/2021                GFRNONAA                 >60                 08/05/2021           ECHO 2021: 1. Left ventricular ejection fraction, by estimation, is 50 to 55%. The  left ventricle has low normal function. The left ventricle has no regional  wall motion abnormalities. Left ventricular diastolic parameters are  consistent with Grade I diastolic  dysfunction (impaired relaxation).  2. Right ventricular systolic function is normal. The right ventricular  size is normal.  3. Color flow turbulence on intra-atrial septum without clear shunt.  Consider Bubble study if concerned for shunting.  4. The mitral valve is grossly normal. No evidence of mitral valve  regurgitation.  5. The aortic valve was not  well visualized. There is mild calcification  of the aortic valve. Aortic valve regurgitation is not visualized.  6. Aortic dilatation noted. There is moderate to severe dilatation of the  ascending aorta, measuring 49 mm.  7. The inferior vena cava is normal in size with greater than 50%  respiratory variability, suggesting right atrial pressure of 3 mmHg.   Comparison(s): No prior  Echocardiogram. )       Anesthesia Quick Evaluation

## 2021-08-05 NOTE — Op Note (Signed)
[name], [place] MEDICAL RECORD NO: [id number] ACCOUNT NO: [id number] DATE OF BIRTH: March 21, 1941 FACILITY: [facility] LOCATION: [facility] PHYSICIAN: Astrid Divine. Marcelino Scot, MD  Operative Report   DATE OF PROCEDURE: 07/28/2021  PREOPERATIVE DIAGNOSES:   1.  Left arm compartment syndrome. 2.  Status post external fixation of left humerus fracture.  POSTOPERATIVE DIAGNOSES: 1.  Left arm compartment syndrome. 2.  Status post external fixation of left humerus fracture.  PROCEDURES:   1.  Irrigation and debridement with excision of necrotic muscle superficial and deep volar compartments mobile wad, and dorsal compartment. 2.  Retention suture closure 38 cm. 3.  Dressing change under anesthesia.  SURGEON:  Astrid Divine. Marcelino Scot, MD.  ANESTHESIOLOGIST:  Ainsley Spinner, PA-C  ANESTHESIA:  General.  ESTIMATED BLOOD LOSS:  150 mL  DRAINS:  None.  PATIENT DISPOSITION:  To PACU.  CONDITION:  Stable.  INDICATIONS FOR PROCEDURE:  The patient is an 81 year old right hand dominant female who has in an unfortunate accident where she became entrapped after falling with her arm in a drawer.  The patient had a humerus fracture with subsequent development of  compartment syndrome.  The patient did not have significant pain associated with this, but a deteriorating motor and sensory examination.  At the time of release, muscle was noted to be noncontractile, but vascularized.  The patient's creatine kinase  subsequently began to climb and she now returns for a second look fasciotomy of all compartments and probable debridement of necrotic muscle.  She has had no return of neurologic function either motor or sensory to the forearm and hand.  I discussed with  her son the risks and benefits of surgical treatment including the possibility of continued necrosis, loss of function, infection and that one of these complications could result in the need for amputation.  He acknowledged these risks and did provide  consent to  proceed.  BRIEF SUMMARY OF PROCEDURE:  The patient was taken to the operating room where general anesthesia was induced.  Her left upper extremity was prepped and draped in the usual sterile fashion.  I did remove the VAC dressing after the patient had been  anesthetized and would complete that VAC dressing change at the conclusion of the procedure.  There was some maceration along the wound edges. There was copious amount of drainage, all of it serous from the external fixator in the arm.  Chlorhexidine  wash and Betadine scrub and paint were performed.  Timeout was held  after draping. The retention sutures were removed such that I could undertake full evaluation of all the compartments.  I did extend the incision proximally into the distal arm. There,  I could identify that the biceps was both vascularized and contractile.  There was a large area of contusion where she had a traumatic wound in the antecubital fossa.  There was a large flap based on the ulnar side and looping over the mobile wad  laterally on the volar side, there was a straight dorsal incision.  As I entered the superficial volar compartment, all the muscle was completely dead, gray, noncontractile.  In careful stepwise fashion to preserve the neurovascular bundles, I excised  this with a combination of sharp dissection with the Metzenbaum scissors as well as Bovie.  I then went into the deep compartment, which unfortunately was in similar shape, gray, noncontractile and this muscle was debrided as well.  I was able to  preserve a small amount of FDP to the flexor digiti minimi, but the remainder of  the muscle really was in too bad shape to maintain.  I did preserve the tendons, which were left essentially in the wrist band area proximal to the transverse carpal  ligament in case tendon transfer should be an option in the future, though it certainly would seem unlikely.  I then went to the mobile wad. Here, both the superficial muscle  bellies were also necrotic, though the brachioradialis did have some  vascularity, but was not really significantly contractile.  It was left, however.  The radial artery was intact throughout its course and had an excellent pulse, which was easily palpable.  Again, I did tease out the neurovascular bundles in all these  compartments.  I then turned my attention to the dorsal side of the forearm where the dorsal compartment was noted to be pink, but completely noncontractile as well. Minimal amount of excision was performed here just on the areas that were gray.  Given  the edema and soft tissue swelling from the ischemic injury, it was impossible to perform a standard layered closure, but retention suture closure was successfully performed using a far-near-near-far 2-0 nylon suture technique.  This was a very tedious  and time consuming and the sum of the incisions over the volar compartment and the dorsal was 38 cm.  I then completed the wound VAC dressing change, placing Mepitel directly over the wounds and then the VAC to assist with removal of this in a  continually edematous tissues.  A sterile, gently compressive dressing was applied and the pin sites were wrapped with Kerlix gauze.  Ainsley Spinner, PA-C, was present and assisted me throughout.  No tourniquet was used during the procedure. PROGNOSIS:  The patient has had a devastating injury with dismal prognosis for any return of function.  We will likely return to the OR if her soft tissues have stabilized for ORIF of her left humerus fracture. If we do not identify any evidence of  infection, we will not reenter the forearm unless necessary.  She will undergo splinting to maximize the resting position of her hand.   VAI D: 08/04/2021 3:33:23 pm T: 08/05/2021 12:19:00 am  JOB: [id number]/ [id number]

## 2021-08-05 NOTE — Progress Notes (Signed)
OT Cancellation Note  Patient Details Name: Tasha Diaz MRN: [id number] DOB: 1940-12-14   Cancelled Treatment:    Reason Eval/Treat Not Completed: Patient at procedure or test/ unavailable;Other (comment) (pt in surgery, will follow up as schedule allows)  Lynnda Child, OTD, OTR/L Acute Rehab [phone] - [place] 08/05/2021, 10:23 AM

## 2021-08-05 NOTE — Brief Op Note (Signed)
08/05/2021  5:57 PM  PATIENT:  [name]  81 y.o. female  PROCEDURES:  Procedure(s): INTRAMEDULLARY NAILING OF THE LEFT HUMERAL SHAFT 2.   REMOVAL EXTERNAL FIXATION ARM (Left) 3.   DRESSING CHANGE UNDER ANESTHESIA LEFT FOREARM  SURGEON:  Surgeon(s) and Role:    Altamese [place], MD - Primary  PHYSICIAN ASSISTANT: Ainsley Spinner, [place]  #[id number]

## 2021-08-05 NOTE — Transfer of Care (Signed)
Immediate Anesthesia Transfer of Care Note  Patient: [name]  Procedure(s) Performed: OPEN REDUCTION INTERNAL FIXATION (ORIF) HUMERAL SHAFT FRACTURE (Left) REMOVAL EXTERNAL FIXATION ARM (Left)  Patient Location: PACU  Anesthesia Type:General  Level of Consciousness: drowsy  Airway & Oxygen Therapy: Patient Spontanous Breathing and Patient connected to face mask oxygen  Post-op Assessment: Report given to RN and Post -op Vital signs reviewed and stable  Post vital signs: Reviewed and stable  Last Vitals:  Vitals Value Taken Time  BP 153/72 08/05/21 1418  Temp    Pulse 90 08/05/21 1425  Resp 17 08/05/21 1425  SpO2 95 % 08/05/21 1425  Vitals shown include unvalidated device data.  Last Pain:  Vitals:   08/05/21 1107  TempSrc: Oral  PainSc:       Patients Stated Pain Goal: 3 (94/70/96 2836)  Complications: No notable events documented.

## 2021-08-05 NOTE — Progress Notes (Addendum)
[facility]   No interval change  No pain in L arm  No sensation noted to left hand along all distributions No motor function L hand all distributions   Spoke with son, reviewed risks and benefits   OR today for ORIF L humerus and removal of external fixator L arm  2 units PRBCs ordered for surgery today   Jari Pigg, PA-C [phone] (C) 08/05/2021, 10:05 AM  [facility] [place] [id number] [phone] (F)      Patient ID: [name], female   DOB: October 05, 1940, 81 y.o.   MRN: [id number]  I have seen and examined the patient, I agree with the findings above by Ainsley Spinner, PA-C, and I have directed the plan for treatment as noted.  The risk and benefits were discussed with the patient's son the risks and benefits of surgery, including the possibility of infection, nerve injury, vessel injury, wound breakdown, arthritis, symptomatic hardware, DVT/ PE, loss of motion, malunion, nonunion, and need for further surgery among others.  We also specifically discussed the elevated risk of soft tissue breakdown that could lead to amputation.  Risks have been acknowledged and consent provided to proceed.  Rozanna Box, MD 08/05/2021 11:04 AM  I

## 2021-08-05 NOTE — Anesthesia Postprocedure Evaluation (Signed)
Anesthesia Post Note  Patient: [name]  Procedure(s) Performed: OPEN REDUCTION INTERNAL FIXATION (ORIF) HUMERAL SHAFT FRACTURE (Left) REMOVAL EXTERNAL FIXATION ARM (Left)     Patient location during evaluation: PACU Anesthesia Type: General Level of consciousness: awake and alert Pain management: pain level controlled Vital Signs Assessment: post-procedure vital signs reviewed and stable Respiratory status: spontaneous breathing, nonlabored ventilation, respiratory function stable and patient connected to nasal cannula oxygen Cardiovascular status: blood pressure returned to baseline and stable Postop Assessment: no apparent nausea or vomiting Anesthetic complications: no   No notable events documented.  Last Vitals:  Vitals:   08/05/21 1450 08/05/21 1520  BP: (!) 153/79 (!) 147/82  Pulse: 85 85  Resp: 19 20  Temp: 36.5 C 36.6 C  SpO2: 98% 93%    Last Pain:  Vitals:   08/05/21 1520  TempSrc: Oral  PainSc:                  March Rummage [place]

## 2021-08-05 NOTE — Anesthesia Procedure Notes (Addendum)
Procedure Name: Intubation Date/Time: 08/05/2021 11:32 AM Performed by: Reece Agar, CRNA Pre-anesthesia Checklist: Patient identified, Emergency Drugs available, Suction available and Patient being monitored Patient Re-evaluated:Patient Re-evaluated prior to induction Oxygen Delivery Method: Circle System Utilized Preoxygenation: Pre-oxygenation with 100% oxygen Induction Type: IV induction Ventilation: Mask ventilation without difficulty Laryngoscope Size: Mac and 3 Grade View: Grade II Tube type: Oral Number of attempts: 1 Airway Equipment and Method: Stylet Placement Confirmation: ETT inserted through vocal cords under direct vision, positive ETCO2 and breath sounds checked- equal and bilateral Secured at: 22 cm Tube secured with: Tape Dental Injury: Teeth and Oropharynx as per pre-operative assessment

## 2021-08-05 NOTE — Progress Notes (Signed)
PROGRESS NOTE    [name]  IWL:[id number] DOB: 02/04/1941 DOA: 07/27/2021 PCP: Orpah Melter, MD   Brief Narrative:  [name] is a 81 y.o. female with medical history significant of HTN, alzheimers dementia, anemia,  anxiety and depression who presents to ED after fall with subsequent left arm trauma. History is from her son due to her dementia. Ortho consulted given compartment syndrome/humerus fracture on L side.  1/23 status post surgery on 07/27/2021.  Hemoglobin 6.0, getting 2 units of packed red blood cell transfusion this a.m. 1/24 s/p ORIF for repeat evaluation of left forearm status post fasciotomies possible debridement of nonviable necrotic muscle, possible wound closure today by orthopedics.  FOB positive, creatinine up 1/25 pain well controlled attempting to ambulate with PT later today if possible -has been difficult given her walker bound status with left arm fracture 1/26 ortho to re-evaluate left arm compartment syndrome 1/27-1/30 external fixation remains intact awaiting ORIF per their expertise - re-evaluation of compartment syndrome/wound VAC in OR 1/31 tentative plan for ORIF of left humerus today per orthopedic surgery   Assessment & Plan:   Left humeral fracture- (present on admission) S/p fasciotomy Lt forearm, external fixation Lt humerus, wound vac application on 1/74 0/81 s/p OR for compartment closure -tolerated well Lovenox per orthopedics PT/OT ongoing, likely disposition to SNF given walker bound status and inability to use walker given left arm fracture (currently nonweightbearing per Ortho).   Traumatic rhabdomyolysis ([place])- (present on admission) improving AKI, decreased urine output Concurrent hyperkalemia -Ck trending down appropriately -Continue low rate IV fluids given poor p.o. intake perioperatively today -Creatinine continues to trend upwards, urine output low overnight but improving this morning -Potassium minimally elevated,  should correct with increased urine output   Acute blood loss anemia due to surgery Chronic anemia, iron deficiency Questionable GI bleed - Hemoglobin continues to drop slowly in the setting of above - Wound vac continues to put out blood tinged fluid - Fobt positive -follow clinically - GI consulted given ongoing drop in Hgb per son's wishes but patient and son are not agreeable for endoscopy - Hold lovenox   Essential hypertension- (present on admission) BP stable continue to monitor   Alzheimer disease ([place])- (present on admission) At baseline, delirium precautions    Thoracic ascending aortic aneurysm- (present on admission) Atypical chest pain -Transient atypical chest pain with negative cardiac work-up including EKG Noted and follows outpatient.   Hyperlipidemia Holding statin due to rhabdomyolysis   Hyponatremia Mild, chronic, no indication for treatment   DVT prophylaxis: scd, hold lovenox due to ongoing anemia and questionable blood loss Code Status: DNR -confirmed with son at bedside, reviewed living well which indicates "patient would want a natural death without any heroic measures or artificial life support/nutrition" Family Communication: Son updated at bedside  Status is: Inpatient  Dispo: The patient is from: Home              Anticipated d/c is to: SNF              Anticipated d/c date is: 72+ hours              Patient currently not medically stable for discharge  Consultants:  Orthopedic surgery  Procedures:  External fixation left humerus Compartment syndrome evaluation and debridement of distal L arm ORIF pending  Antimicrobials:  None  Subjective: Anxiously awaiting surgery this morning without any overt complaints or issues  Objective: Vitals:   08/04/21 0845 08/04/21 1524 08/04/21 2000 08/05/21 0411  BP: (!) 162/80 123/69 125/72 118/71  Pulse:  96 94 89  Resp: 18 19 18 18   Temp:  98.5 F (36.9 C) 98.7 F (37.1 C) 98.6 F (37 C)   TempSrc:  Oral Oral Oral  SpO2: 99%  98% 98%  Weight:      Height:        Intake/Output Summary (Last 24 hours) at 08/05/2021 0742 Last data filed at 08/04/2021 1726 Gross per 24 hour  Intake 1622.18 ml  Output 50 ml  Net 1572.18 ml    Filed Weights   07/29/21 0004 07/29/21 0754  Weight: 80 kg 80 kg    Examination:  General:  Pleasantly resting in bed, No acute distress. HEENT:  Normocephalic atraumatic.  Sclerae nonicteric, noninjected.  Extraocular movements intact bilaterally. Neck:  Without mass or deformity.  Trachea is midline. Lungs:  Clear to auscultate bilaterally without rhonchi, wheeze, or rales. Heart:  Regular rate and rhythm.  Without murmurs, rubs, or gallops. Abdomen:  Soft, nontender, nondistended.  Without guarding or rebound. Extremities: LUE external fixation intact - distal bandage clean/dry   Data Reviewed: I have personally reviewed following labs and imaging studies  CBC: Recent Labs  Lab 07/31/21 0432 08/01/21 0134 08/02/21 0441 08/03/21 0556 08/05/21 0448  WBC 8.8 8.1 8.5 10.4 8.4  HGB 7.6* 7.8* 7.6* 7.9* 7.8*  HCT 23.4* 24.2* 25.0* 25.3* 25.4*  MCV 89.7 92.0 92.9 93.4 95.1  PLT 304 325 307 340 245    Basic Metabolic Panel: Recent Labs  Lab 07/31/21 0432 08/01/21 0134 08/02/21 0441 08/03/21 0556 08/05/21 0448  NA 132* 131* 135 137 139  K 4.8 4.7 3.9 3.9 3.2*  CL 105 102 107 109 110  CO2 19* 21* 21* 20* 21*  GLUCOSE 107* 111* 106* 147* 104*  BUN 58* 59* 49* 40* 23  CREATININE 2.91* 2.71* 1.91* 1.54* 0.70  CALCIUM 7.6* 7.8* 7.7* 7.7* 8.0*    GFR: Estimated Creatinine Clearance: 52.5 mL/min (by C-G formula based on SCr of 0.7 mg/dL). Liver Function Tests: No results for input(s): AST, ALT, ALKPHOS, BILITOT, PROT, ALBUMIN in the last 168 hours. No results for input(s): LIPASE, AMYLASE in the last 168 hours. No results for input(s): AMMONIA in the last 168 hours. Coagulation Profile: No results for input(s): INR, PROTIME in  the last 168 hours. Cardiac Enzymes: Recent Labs  Lab 07/30/21 0218 07/31/21 0432 08/01/21 0134  CKTOTAL 4,252* 2,840* 2,016*    BNP (last 3 results) No results for input(s): PROBNP in the last 8760 hours. HbA1C: No results for input(s): HGBA1C in the last 72 hours. CBG: No results for input(s): GLUCAP in the last 168 hours. Lipid Profile: No results for input(s): CHOL, HDL, LDLCALC, TRIG, CHOLHDL, LDLDIRECT in the last 72 hours. Thyroid Function Tests: No results for input(s): TSH, T4TOTAL, FREET4, T3FREE, THYROIDAB in the last 72 hours. Anemia Panel: No results for input(s): VITAMINB12, FOLATE, FERRITIN, TIBC, IRON, RETICCTPCT in the last 72 hours.  Sepsis Labs: No results for input(s): PROCALCITON, LATICACIDVEN in the last 168 hours.  Recent Results (from the past 240 hour(s))  Resp Panel by RT-PCR (Flu A&B, Covid) Nasopharyngeal Swab     Status: None   Collection Time: 07/27/21 11:49 AM   Specimen: Nasopharyngeal Swab; Nasopharyngeal(NP) swabs in vial transport medium  Result Value Ref Range Status   SARS Coronavirus 2 by RT PCR NEGATIVE NEGATIVE Final    Comment: (NOTE) SARS-CoV-2 target nucleic acids are NOT DETECTED.  The SARS-CoV-2 RNA is generally detectable in upper respiratory  specimens during the acute phase of infection. The lowest concentration of SARS-CoV-2 viral copies this assay can detect is 138 copies/mL. A negative result does not preclude SARS-Cov-2 infection and should not be used as the sole basis for treatment or other patient management decisions. A negative result may occur with  improper specimen collection/handling, submission of specimen other than nasopharyngeal swab, presence of viral mutation(s) within the areas targeted by this assay, and inadequate number of viral copies(<138 copies/mL). A negative result must be combined with clinical observations, patient history, and epidemiological information. The expected result is  Negative.  Fact Sheet for Patients:  EntrepreneurPulse.com.au  Fact Sheet for Healthcare Providers:  IncredibleEmployment.be  This test is no t yet approved or cleared by the [place] FDA and  has been authorized for detection and/or diagnosis of SARS-CoV-2 by FDA under an Emergency Use Authorization (EUA). This EUA will remain  in effect (meaning this test can be used) for the duration of the COVID-19 declaration under Section 564(b)(1) of the Act, 21 U.S.C.section 360bbb-3(b)(1), unless the authorization is terminated  or revoked sooner.       Influenza A by PCR NEGATIVE NEGATIVE Final   Influenza B by PCR NEGATIVE NEGATIVE Final    Comment: (NOTE) The Xpert Xpress SARS-CoV-2/FLU/RSV plus assay is intended as an aid in the diagnosis of influenza from Nasopharyngeal swab specimens and should not be used as a sole basis for treatment. Nasal washings and aspirates are unacceptable for Xpert Xpress SARS-CoV-2/FLU/RSV testing.  Fact Sheet for Patients: EntrepreneurPulse.com.au  Fact Sheet for Healthcare Providers: IncredibleEmployment.be  This test is not yet approved or cleared by the [place] FDA and has been authorized for detection and/or diagnosis of SARS-CoV-2 by FDA under an Emergency Use Authorization (EUA). This EUA will remain in effect (meaning this test can be used) for the duration of the COVID-19 declaration under Section 564(b)(1) of the Act, 21 U.S.C. section 360bbb-3(b)(1), unless the authorization is terminated or revoked.  Performed at [facility], [place] [address], [place], [place] [address]    Radiology Studies: DG Humerus Left  Result Date: 08/04/2021 CLINICAL DATA:  External fixation.  Check alignment. EXAM: LEFT HUMERUS - 2+ VIEW COMPARISON:  07/27/2021 FINDINGS: No significant change since the study of 8 days ago. There is a 4-5 mm gap separating proximal in  distal fragments. Distal fragment is again seen to be 8-9 mm lateral relative to the proximal fragment, about half the with of the bone. IMPRESSION: No change.  Minor offsets as above. Electronically Signed   By: Nelson Chimes M.D.   On: 08/04/2021 14:12    Scheduled Meds:  sodium chloride   Intravenous Once   acetaminophen  650 mg Oral Q8H   cefadroxil  500 mg Oral BID   Chlorhexidine Gluconate Cloth  6 each Topical Daily   docusate sodium  100 mg Oral BID   feeding supplement  1 Container Oral TID BM   gabapentin  100 mg Oral BID   mouth rinse  15 mL Mouth Rinse BID   pantoprazole  40 mg Oral BID AC   senna  1 tablet Oral BID   sodium zirconium cyclosilicate  10 g Oral Once   Continuous Infusions:  sodium chloride 50 mL/hr at 08/04/21 1401   sodium chloride      LOS: 9 days   Time spent: 42min  Josiah Nieto C Suhaila Troiano, DO [facility]  If 7PM-7AM, please contact night-coverage [email or web address]  08/05/2021, 7:42 AM

## 2021-08-05 NOTE — Progress Notes (Signed)
Occupational Therapy Treatment Patient Details Name: [name] MRN: [id number] DOB: 12/10/1940 Today's Date: 08/05/2021   History of present illness Patient is a 81 y/o female who presents on 07/27/21 with left arm trauma after getting LUE stuck in a dresser. Found to have left humerus fx and rhabdomyolosis. s/p fasciotomy, external fixator and wound vac placement 07/27/21. S/p ORIF on 1/31. PMH includes dementia, osteoporosis, anxiety, depression, HTN, skin ca, HTN.   OT comments  Pt seen today for ROM and positioning. S/p ORIF of L humerus. Pt tolerates PROM of hand, wrist, and digits well all WFL given bandage/edema, reporting initial pain with elbow ROM. Pt max A +2 to center trunk in bed, pt able to perform AAROM of RUE, shoulder flexion ~80 degrees while in supine. Pt demonstrating strong R lateral trunk lean in bed, positioned pillows/blankets on R side to keep pt centered. Pt presenting with impairments listed below, will follow acutely. Continue to recommend SNF at d/c.   Recommendations for follow up therapy are one component of a multi-disciplinary discharge planning process, led by the attending physician.  Recommendations may be updated based on patient status, additional functional criteria and insurance authorization.    Follow Up Recommendations  Skilled nursing-short term rehab (<3 hours/day)    Assistance Recommended at Discharge Frequent or constant Supervision/Assistance  Patient can return home with the following  Two people to help with walking and/or transfers;A lot of help with bathing/dressing/bathroom;Assistance with cooking/housework;Assist for transportation;Help with stairs or ramp for entrance   Equipment Recommendations  BSC/3in1    Recommendations for Other Services      Precautions / Restrictions Precautions Precautions: Fall Precaution Comments: A/AA/PROM ROM as tolerated LUE; multiple blisters BLE adn L UE Restrictions Weight Bearing Restrictions:  Yes LUE Weight Bearing: Non weight bearing Other Position/Activity Restrictions: 1/30 per Ortho PA note: No ROM restrictions; Aggressive passive motion of hand, wrist, elbow       Mobility Bed Mobility Overal bed mobility: Needs Assistance             General bed mobility comments: max A +2 to shift trunk to center of bed    Transfers                         Balance                                           ADL either performed or assessed with clinical judgement   ADL                                              Extremity/Trunk Assessment Upper Extremity Assessment Upper Extremity Assessment: LUE deficits/detail RUE Deficits / Details: generalized weakness LUE Deficits / Details: absent motor/sensory functions LUE: Unable to fully assess due to pain;Unable to fully assess due to immobilization LUE Sensation: decreased light touch;decreased proprioception LUE Coordination: decreased fine motor;decreased gross motor   Lower Extremity Assessment Lower Extremity Assessment: Defer to PT evaluation        Vision   Vision Assessment?: No apparent visual deficits   Perception Perception Perception: Not tested   Praxis Praxis Praxis: Not tested    Cognition Arousal/Alertness: Lethargic Behavior During Therapy: WFL for tasks assessed/performed Overall Cognitive Status: Impaired/Different  from baseline Area of Impairment: Safety/judgement, Memory, Awareness, Problem solving                     Memory: Decreased recall of precautions, Decreased short-term memory   Safety/Judgement: Decreased awareness of safety, Decreased awareness of deficits Awareness: Intellectual Problem Solving: Slow processing, Decreased initiation, Difficulty sequencing, Requires verbal cues, Requires tactile cues General Comments: dementia at baseline        Exercises Exercises: General Upper Extremity General Exercises - Upper  Extremity Shoulder Flexion: PROM, Left, Supine, 5 reps Elbow Flexion: PROM, Left, 5 reps, Supine Elbow Extension: PROM, Left, 10 reps, Seated Wrist Flexion: PROM, Left, 10 reps, Seated Wrist Extension: PROM, Left, 10 reps, Seated Digit Composite Flexion: PROM, Left, 10 reps, Seated Composite Extension: PROM, Left, 10 reps, Seated    Shoulder Instructions       General Comments pt tolerating ROM of hand/digits/wrist, increased pain with elbow ROM    Pertinent Vitals/ Pain       Pain Assessment Pain Assessment: Faces Pain Score: 4  Faces Pain Scale: Hurts little more Pain Location: elbow with PROM Pain Descriptors / Indicators: Grimacing, Sore, Discomfort Pain Intervention(s): Limited activity within patient's tolerance, Monitored during session, Repositioned  Home Living                                          Prior Functioning/Environment              Frequency  Min 3X/week        Progress Toward Goals  OT Goals(current goals can now be found in the care plan section)  Progress towards OT goals: Progressing toward goals  Acute Rehab OT Goals Patient Stated Goal: none stated OT Goal Formulation: With patient Time For Goal Achievement: 08/11/21 Potential to Achieve Goals: Fair ADL Goals Pt Will Perform Grooming: with min assist;sitting Pt Will Perform Upper Body Dressing: with min assist;sitting Pt Will Transfer to Toilet: with mod assist;with +2 assist;stand pivot transfer Pt/caregiver will Perform Home Exercise Program: Left upper extremity;Increased ROM;With written HEP provided;With minimal assist Additional ADL Goal #1: Pt will tolerate L resting hadn splint for 2 hour intervals to reduce risk of contracture formation Additional ADL Goal #2: Family will demonstrate appropirate positioning to decrease dependent edema L hand/forearm  Plan Discharge plan remains appropriate    Co-evaluation                 AM-PAC OT "6 Clicks"  Daily Activity     Outcome Measure   Help from another person eating meals?: A Little Help from another person taking care of personal grooming?: A Lot Help from another person toileting, which includes using toliet, bedpan, or urinal?: Total Help from another person bathing (including washing, rinsing, drying)?: Total Help from another person to put on and taking off regular upper body clothing?: Total Help from another person to put on and taking off regular lower body clothing?: Total 6 Click Score: 9    End of Session    OT Visit Diagnosis: Unsteadiness on feet (R26.81);Other abnormalities of gait and mobility (R26.89);Muscle weakness (generalized) (M62.81);Other symptoms and signs involving cognitive function Pain - Right/Left: Left Pain - part of body: Arm;Shoulder;Hand;Hip;Leg   Activity Tolerance Patient tolerated treatment well   Patient Left with SCD's reapplied;in bed;with call bell/phone within reach;with bed alarm set   Nurse Communication Mobility status  Time: 0964-3838 OT Time Calculation (min): 20 min  Charges: OT General Charges $OT Visit: 1 Visit OT Treatments $Therapeutic Exercise: 8-22 mins  Lynnda Child, OTD, OTR/L Acute Rehab [phone] - [place] 08/05/2021, 5:24 PM

## 2021-08-06 ENCOUNTER — Inpatient Hospital Stay (HOSPITAL_COMMUNITY): Payer: Medicare PPO

## 2021-08-06 DIAGNOSIS — N179 Acute kidney failure, unspecified: Secondary | ICD-10-CM | POA: Diagnosis present

## 2021-08-06 DIAGNOSIS — T79A0XA Compartment syndrome, unspecified, initial encounter: Secondary | ICD-10-CM | POA: Diagnosis present

## 2021-08-06 DIAGNOSIS — T796XXA Traumatic ischemia of muscle, initial encounter: Secondary | ICD-10-CM

## 2021-08-06 DIAGNOSIS — T79A12A Traumatic compartment syndrome of left upper extremity, initial encounter: Secondary | ICD-10-CM

## 2021-08-06 LAB — BPAM RBC
Blood Product Expiration Date: 202302022359
Blood Product Expiration Date: 202302032359
ISSUE DATE / TIME: 202301311047
ISSUE DATE / TIME: 202301311234
Unit Type and Rh: 5100
Unit Type and Rh: 5100

## 2021-08-06 LAB — TYPE AND SCREEN
ABO/RH(D): O POS
Antibody Screen: NEGATIVE
Unit division: 0
Unit division: 0

## 2021-08-06 LAB — BASIC METABOLIC PANEL
Anion gap: 7 (ref 5–15)
BUN: 20 mg/dL (ref 8–23)
CO2: 25 mmol/L (ref 22–32)
Calcium: 7.9 mg/dL — ABNORMAL LOW (ref 8.9–10.3)
Chloride: 108 mmol/L (ref 98–111)
Creatinine, Ser: 0.87 mg/dL (ref 0.44–1.00)
GFR, Estimated: >60 mL/min (ref >60)
Glucose, Bld: 128 mg/dL — ABNORMAL HIGH (ref 70–99)
Potassium: 3.8 mmol/L (ref 3.5–5.1)
Sodium: 140 mmol/L (ref 135–145)

## 2021-08-06 LAB — CBC
HCT: 30.2 % — ABNORMAL LOW (ref 36.0–46.0)
Hemoglobin: 9.9 g/dL — ABNORMAL LOW (ref 12.0–15.0)
MCH: 29.6 pg (ref 26.0–34.0)
MCHC: 32.8 g/dL (ref 30.0–36.0)
MCV: 90.4 fL (ref 80.0–100.0)
Platelets: 206 10*3/uL (ref 150–400)
RBC: 3.34 MIL/uL — ABNORMAL LOW (ref 3.87–5.11)
RDW: 19.8 % — ABNORMAL HIGH (ref 11.5–15.5)
WBC: 11.1 10*3/uL — ABNORMAL HIGH (ref 4.0–10.5)
nRBC: 0.9 % — ABNORMAL HIGH (ref 0.0–0.2)

## 2021-08-06 LAB — MAGNESIUM: Magnesium: 1.3 mg/dL — ABNORMAL LOW (ref 1.7–2.4)

## 2021-08-06 LAB — VITAMIN D 25 HYDROXY (VIT D DEFICIENCY, FRACTURES): Vit D, 25-Hydroxy: 17.38 ng/mL — ABNORMAL LOW (ref 30–100)

## 2021-08-06 MED ORDER — MAGNESIUM SULFATE 4 GM/100ML IV SOLN
4.0000 g | Freq: Once | INTRAVENOUS | Status: AC
  Administered 2021-08-06: 4 g via INTRAVENOUS
  Filled 2021-08-06: qty 100

## 2021-08-06 MED ORDER — MAGNESIUM SULFATE 2 GM/50ML IV SOLN
2.0000 g | Freq: Once | INTRAVENOUS | Status: AC
  Administered 2021-08-06: 2 g via INTRAVENOUS
  Filled 2021-08-06: qty 50

## 2021-08-06 MED ORDER — VITAMIN D 25 MCG (1000 UNIT) PO TABS
2000.0000 [IU] | ORAL_TABLET | Freq: Two times a day (BID) | ORAL | Status: DC
  Administered 2021-08-06 – 2021-08-07 (×2): 2000 [IU] via ORAL
  Filled 2021-08-06 (×2): qty 2

## 2021-08-06 MED ORDER — GABAPENTIN 100 MG PO CAPS
200.0000 mg | ORAL_CAPSULE | Freq: Three times a day (TID) | ORAL | Status: DC
  Administered 2021-08-06 – 2021-08-07 (×3): 200 mg via ORAL
  Filled 2021-08-06 (×3): qty 2

## 2021-08-06 NOTE — TOC Progression Note (Signed)
Transition of Care [facility]) - Progression Note    Patient Details  Name: [name] MRN: [id number] Date of Birth: 1940/12/20  Transition of Care [facility]) CM/SW Contact  Joanne Chars, LCSW Phone Number: 08/06/2021, 11:26 AM  Clinical Narrative:   Pt not stable for DC to SNF today but possibly tomorrow.  CSW confirmed with [facility] that they can receive tomorrow.  CSW spoke with [employer] and they do manage this [place] for SNF auth.  SNF auth request submitted in [facility] and immediately approved: [id number], 5 days 2/2-2/6.      Expected Discharge Plan: [place] Barriers to Discharge: Continued Medical Work up, SNF Pending bed offer  Expected Discharge Plan and Services Expected Discharge Plan: [place]: [place] arrangements for the past 2 months: [place] ([place])                                       Social Determinants of Health (SDOH) Interventions    Readmission Risk Interventions No flowsheet data found.

## 2021-08-06 NOTE — Progress Notes (Signed)
[facility] PROGRESS NOTE   [name]:[id number] DOB: 13-Oct-1940 DOA: 07/27/2021  10 DOS: the patient was seen and examined on 08/06/2021  PCP: Orpah Melter, MD  Brief History and Hospital Course:  [name] is a 81 y.o. female with medical history significant of HTN, alzheimers dementia, anemia,  anxiety and depression who presents to ED after fall with subsequent left arm trauma. History is from her son due to her dementia. Ortho consulted given compartment syndrome/humerus fracture on L side.   1/23 status post surgery on 07/27/2021.  Hemoglobin 6.0, getting 2 units of packed red blood cell transfusion this a.m. 1/24 s/p ORIF for repeat evaluation of left forearm status post fasciotomies possible debridement of nonviable necrotic muscle, possible wound closure today by orthopedics.  FOB positive, creatinine up 1/25 pain well controlled attempting to ambulate with PT later today if possible -has been difficult given her walker bound status with left arm fracture 1/26 ortho to re-evaluate left arm compartment syndrome 1/27-1/30 external fixation remains intact awaiting ORIF per their expertise - re-evaluation of compartment syndrome/wound VAC in OR 1/31 ORIF of left humerus  Consultants: Orthopedics     Subjective: Unable to move her fingers on the left hand.  No chest pain shortness of breath.  No nausea vomiting.    Assessment/Plan:   * Left humeral fracture- (present on admission) Now status post intramedullary nailing of the left humerus on 1/31.  Compartment syndrome ([place])- (present on admission) Involving the left forearm with mild necrosis.  She has limited mobility of her left hand and fingers.  Prognosis is guarded.  Orthopedics is following. She is status post multiple surgeries including fasciotomy, irrigation and debridement.  Wound VAC was applied. Orthopedics is following and managing.  Traumatic rhabdomyolysis ([place])- (present on  admission) CK on arrival 23,294.  Patient was aggressively hydrated.  CK started trending down.  Need to monitor electrolytes periodically.  AKI (acute kidney injury) ([place])- (present on admission) Hypomagnesemia  Secondary to rhabdomyolysis.  Renal function has improved. Replace magnesium.  Anemia- (present on admission) Baseline hgb around 10.7 two months ago.  Drop in hemoglobin was noted at the time of admission. Looks like she was transfused 2 units of PRBC yesterday.  Previously required 3 units.  Total of 5 units have been transfused during this hospital stay.  Essential hypertension- (present on admission) Continue to monitor.  Currently not on any antihypertensives.  Alzheimer disease ([place])- (present on admission) At baseline, delirium precautions.  Thoracic ascending aortic aneurysm- (present on admission) Continue to monitor in the outpatient setting.  Hyperlipidemia Holding statin in setting of rhabdo     Obesity Estimated body mass index is 34.44 kg/m as calculated from the following:   Height as of this encounter: 5' (1.524 m).   Weight as of this encounter: 80 kg.   DVT Prophylaxis: SCDs Code Status: DNR Family Communication: No family at bedside Disposition Plan: SNF  Status is: Inpatient Remains inpatient appropriate because: Compartment syndrome, multiple surgeries  Planned Discharge Destination: Skilled nursing facility           Medications: Scheduled:  sodium chloride   Intravenous Once   sodium chloride   Intravenous Once   acetaminophen  650 mg Oral Q8H   vitamin C  1,000 mg Oral Daily   Chlorhexidine Gluconate Cloth  6 each Topical Daily   docusate sodium  100 mg Oral BID   feeding supplement  1 Container Oral TID BM   gabapentin  200 mg Oral TID  mouth rinse  15 mL Mouth Rinse BID   pantoprazole  40 mg Oral BID AC   senna  1 tablet Oral BID   zinc sulfate  220 mg Oral Daily   Continuous:  sodium chloride 50 mL/hr at 08/04/21  1401   sodium chloride     WGN:FAOZHYQMV, diphenhydrAMINE, HYDROcodone-acetaminophen, metoCLOPramide **OR** metoCLOPramide (REGLAN) injection, morphine injection, ondansetron **OR** ondansetron (ZOFRAN) IV, polyethylene glycol  Antibiotics: Anti-infectives (From admission, onward)    Start     Dose/Rate Route Frequency Ordered Stop   08/05/21 1615  ceFAZolin (ANCEF) IVPB 2g/100 mL premix        2 g 200 mL/hr over 30 Minutes Intravenous Every 8 hours 08/05/21 1527 08/06/21 0716   08/05/21 1115  ceFAZolin (ANCEF) IVPB 3g/100 mL premix        3 g 200 mL/hr over 30 Minutes Intravenous  Once 08/05/21 1106 08/05/21 1205   08/05/21 1110  ceFAZolin (ANCEF) 3-0.9 GM/100ML-% IVPB       Note to Pharmacy: Cameron Sprang M: cabinet override      08/05/21 1110 08/05/21 1216   08/04/21 1300  cefadroxil (DURICEF) capsule 500 mg  Status:  Discontinued        500 mg Oral 2 times daily 08/04/21 1204 08/05/21 1527   07/30/21 0330  ceFAZolin (ANCEF) IVPB 2g/100 mL premix        2 g 200 mL/hr over 30 Minutes Intravenous Every 12 hours 07/29/21 1716 07/30/21 0354   07/29/21 1630  ceFAZolin (ANCEF) IVPB 2g/100 mL premix  Status:  Discontinued        2 g 200 mL/hr over 30 Minutes Intravenous Every 8 hours 07/29/21 1250 07/29/21 1716   07/29/21 0700  ceFAZolin (ANCEF) IVPB 2g/100 mL premix        2 g 200 mL/hr over 30 Minutes Intravenous  Once 07/28/21 2155 07/29/21 0838   07/28/21 0600  ceFAZolin (ANCEF) IVPB 2g/100 mL premix  Status:  Discontinued        2 g 200 mL/hr over 30 Minutes Intravenous On call to O.R. 07/27/21 2313 07/28/21 0012   07/28/21 0020  ceFAZolin (ANCEF) IVPB 2g/100 mL premix        2 g 200 mL/hr over 30 Minutes Intravenous Every 6 hours 07/28/21 0020 07/29/21 0029   07/27/21 2201  vancomycin (VANCOCIN) powder  Status:  Discontinued          As needed 07/27/21 2202 07/27/21 2234   07/27/21 1957  ceFAZolin (ANCEF) 2-4 GM/100ML-% IVPB       Note to Pharmacy: Ubaldo Glassing M:  cabinet override      07/27/21 1957 07/28/21 0814       Objective:  Vital Signs  Vitals:   08/05/21 1931 08/05/21 2353 08/06/21 0435 08/06/21 0700  BP: 121/72 132/63 (!) 143/78 (!) 125/51  Pulse: 99 (!) 110 94 89  Resp: 19 20 18 11   Temp: 97.6 F (36.4 C) 98 F (36.7 C)  97.9 F (36.6 C)  TempSrc: Oral Oral  Oral  SpO2: 97% 97% 98% 100%  Weight:      Height:        Intake/Output Summary (Last 24 hours) at 08/06/2021 1258 Last data filed at 08/06/2021 0900 Gross per 24 hour  Intake 2030.06 ml  Output 1050 ml  Net 980.06 ml   Filed Weights   07/29/21 0004 07/29/21 0754  Weight: 80 kg 80 kg    General appearance: Awake alert.  In no distress Resp: Clear to auscultation bilaterally.  Normal effort Cardio: S1-S2 is normal regular.  No S3-S4.  No rubs murmurs or bruit GI: Abdomen is soft.  Nontender nondistended.  Bowel sounds are present normal.  No masses organomegaly Neurologic:   No focal neurological deficits.    Lab Results:  Data Reviewed: I have personally reviewed labs and imaging study reports  CBC: Recent Labs  Lab 08/01/21 0134 08/02/21 0441 08/03/21 0556 08/05/21 0448 08/06/21 0510  WBC 8.1 8.5 10.4 8.4 11.1*  HGB 7.8* 7.6* 7.9* 7.8* 9.9*  HCT 24.2* 25.0* 25.3* 25.4* 30.2*  MCV 92.0 92.9 93.4 95.1 90.4  PLT 325 307 340 288 323    Basic Metabolic Panel: Recent Labs  Lab 08/02/21 0441 08/03/21 0556 08/05/21 0448 08/05/21 0857 08/06/21 1132  NA 135 137 139 141 140  K 3.9 3.9 3.2* 3.3* 3.8  CL 107 109 110 111 108  CO2 21* 20* 21* 24 25  GLUCOSE 106* 147* 104* 103* 128*  BUN 49* 40* 23 22 20   CREATININE 1.91* 1.54* 0.70 0.71 0.87  CALCIUM 7.7* 7.7* 8.0* 8.0* 7.9*  MG  --   --   --  1.2* 1.3*    GFR: Estimated Creatinine Clearance: 48.3 mL/min (by C-G formula based on SCr of 0.87 mg/dL).    Cardiac Enzymes: Recent Labs  Lab 07/31/21 0432 08/01/21 0134  CKTOTAL 2,840* 2,016*    Radiology Studies: DG Humerus Left  Result  Date: 08/06/2021 CLINICAL DATA:  Fracture fixation EXAM: LEFT HUMERUS - 2+ VIEW COMPARISON:  None. FINDINGS: Intramedullary rod with screw fixation spanning left humeral shaft fracture. There is near anatomic alignment. IMPRESSION: Post fixation of left humeral shaft fracture with near anatomic alignment. Electronically Signed   By: Macy Mis M.D.   On: 08/06/2021 11:19   DG Humerus Left  Result Date: 08/05/2021 CLINICAL DATA:  ORIF of left humeral fracture. EXAM: LEFT HUMERUS - 2+ VIEW COMPARISON:  Radiographs dated July 27, 2021 FINDINGS: Multiple intraoperative fluoroscopic images for intramedullary nail placement for midshaft humeral fracture now in near anatomical alignment. Total fluoroscopic time was 2 minutes 16 seconds. Total radiation dose was 19.63 mGy. IMPRESSION: Intraoperative utilization of fluoroscopy. Electronically Signed   By: Keane Police D.O.   On: 08/05/2021 13:40   DG Humerus Left  Result Date: 08/04/2021 CLINICAL DATA:  External fixation.  Check alignment. EXAM: LEFT HUMERUS - 2+ VIEW COMPARISON:  07/27/2021 FINDINGS: No significant change since the study of 8 days ago. There is a 4-5 mm gap separating proximal in distal fragments. Distal fragment is again seen to be 8-9 mm lateral relative to the proximal fragment, about half the with of the bone. IMPRESSION: No change.  Minor offsets as above. Electronically Signed   By: Nelson Chimes M.D.   On: 08/04/2021 14:12   DG C-Arm 1-60 Min-No Report  Result Date: 08/05/2021 Fluoroscopy was utilized by the requesting physician.  No radiographic interpretation.   DG C-Arm 1-60 Min-No Report  Result Date: 08/05/2021 Fluoroscopy was utilized by the requesting physician.  No radiographic interpretation.       LOS: 10 days   Florencia Zaccaro [employer] on [email or web address]  08/06/2021, 12:58 PM

## 2021-08-06 NOTE — Progress Notes (Addendum)
Occupational Therapy Treatment Patient Details Name: [name] MRN: [id number] DOB: 03-Jun-1941 Today's Date: 08/06/2021   History of present illness Patient is a 81 y/o female who presents on 07/27/21 with left arm trauma after getting LUE stuck in a dresser. Found to have left humerus fx and rhabdomyolosis. s/p fasciotomy, external fixator and wound vac placement 07/27/21. S/p ORIF on 1/31. PMH includes dementia, osteoporosis, anxiety, depression, HTN, skin ca, HTN.   OT comments  Pt seen today for ROM and splint check, splint doffed upon arrival, no notable skin irritation/redness. Pt tolerates LUE hand/wrist/elbow/shoulder ROM well, PROM WNL given bandage/edema. Shoulder PROM ~50% before painful/stretching sensation per pt. Pt able to more accurately describe pain location/sensation during session. Educated pt on continuing RUE AROM throughout the day to maintain strength. Pt continues to have R lateral lean in bed, provided pillow position to keep pt's trunk centered in bed, gave exercises for neck rotation and lateral flexion to L side to prevent stiffness. Pt verbalized understanding. Reached out to MD/PA, about ordering Sherie Don for pt to improve edema. Will continue to follow, continue to recommend SNF at d/c.  Addendum 1815: Carter pillow brought to pt room, positioned sideways for UE elevation given pt with limited elbow ROM due to ace bandage, unable to flex elbow 90 degrees. Discussed with pt the purpose of increasing elevation to manage UE edema. Splint reapplied to pt, discussed with NSG to adjust carter pillow as needed if pt uncomfortable, can return pt LUE to regular 3 + pillow elevation if unable to tolerate carter pillow. NSG verbalized understanding and will monitor.   Recommendations for follow up therapy are one component of a multi-disciplinary discharge planning process, led by the attending physician.  Recommendations may be updated based on patient status, additional  functional criteria and insurance authorization.    Follow Up Recommendations  Skilled nursing-short term rehab (<3 hours/day)    Assistance Recommended at Discharge Frequent or constant Supervision/Assistance  Patient can return home with the following  Two people to help with walking and/or transfers;A lot of help with bathing/dressing/bathroom;Assistance with cooking/housework;Assist for transportation;Help with stairs or ramp for entrance   Equipment Recommendations  BSC/3in1    Recommendations for Other Services      Precautions / Restrictions Precautions Precautions: Fall Precaution Comments: A/AA/PROM ROM as tolerated LUE; multiple blisters BLE adn L UE Restrictions Weight Bearing Restrictions: Yes LUE Weight Bearing: Weight bear through elbow only Other Position/Activity Restrictions: 2/1 per Ortho PA note: WBAT L UEx thru elbow as needed to facilitate mobilization , aggressive hand/wrist/elbow ROM, shoulder ROM as tolerated       Mobility Bed Mobility Overal bed mobility: Needs Assistance                  Transfers Overall transfer level: Needs assistance                       Balance Overall balance assessment: Needs assistance                                         ADL either performed or assessed with clinical judgement   ADL Overall ADL's : Needs assistance/impaired  Extremity/Trunk Assessment Upper Extremity Assessment Upper Extremity Assessment: LUE deficits/detail RUE Deficits / Details: generalized weakness LUE Deficits / Details: absent motor/sensory functions, increasing with abiltiy to describe exact spot of pain/pain sensation LUE Sensation: decreased light touch;decreased proprioception LUE Coordination: decreased fine motor;decreased gross motor   Lower Extremity Assessment Lower Extremity Assessment: Defer to PT evaluation        Vision    Vision Assessment?: No apparent visual deficits   Perception Perception Perception: Not tested   Praxis Praxis Praxis: Not tested    Cognition Arousal/Alertness: Lethargic Behavior During Therapy: WFL for tasks assessed/performed Overall Cognitive Status: Impaired/Different from baseline Area of Impairment: Safety/judgement, Memory, Awareness, Problem solving                     Memory: Decreased recall of precautions, Decreased short-term memory   Safety/Judgement: Decreased awareness of safety, Decreased awareness of deficits Awareness: Intellectual Problem Solving: Slow processing, Decreased initiation, Difficulty sequencing, Requires verbal cues, Requires tactile cues          Exercises Exercises: General Upper Extremity General Exercises - Upper Extremity Shoulder Flexion: PROM, Left, Supine, 5 reps Shoulder ABduction: PROM, Left, 10 reps, Supine Elbow Flexion: PROM, Left, Supine, 10 reps Elbow Extension: PROM, Left, 10 reps, Seated Wrist Flexion: PROM, Left, 10 reps, Seated Wrist Extension: PROM, Left, 10 reps, Seated Digit Composite Flexion: PROM, Left, 10 reps, Seated Composite Extension: PROM, Left, 10 reps, Seated Other Exercises Other Exercises: Neck rotation to L side Other Exercises: L lateral neck flexion    Shoulder Instructions       General Comments Pt tolerating LUE PROM to hand/wrist/elbow/shoulder during session, able to perform BUE exercises. Educated pt on completing RUE exercises during the day to maintain strength, along with neck exercises rotating and laterally flexing neck to L side as pt has favored R side    Pertinent Vitals/ Pain       Pain Assessment Pain Assessment: Faces Pain Score: 4  Faces Pain Scale: Hurts little more Pain Location: ventral hand & fingertips Pain Descriptors / Indicators: Grimacing, Sore, Discomfort Pain Intervention(s): Limited activity within patient's tolerance, Monitored during session  Home  Living                                          Prior Functioning/Environment              Frequency  Min 3X/week        Progress Toward Goals  OT Goals(current goals can now be found in the care plan section)  Progress towards OT goals: Progressing toward goals  Acute Rehab OT Goals Patient Stated Goal: none stated OT Goal Formulation: With patient Time For Goal Achievement: 08/11/21 Potential to Achieve Goals: Fair ADL Goals Pt Will Perform Grooming: with min assist;sitting Pt Will Perform Upper Body Dressing: with min assist;sitting Pt Will Transfer to Toilet: with mod assist;with +2 assist;stand pivot transfer Pt/caregiver will Perform Home Exercise Program: Left upper extremity;Increased ROM;With written HEP provided;With minimal assist Additional ADL Goal #1: Pt will tolerate L resting hadn splint for 2 hour intervals to reduce risk of contracture formation Additional ADL Goal #2: Family will demonstrate appropirate positioning to decrease dependent edema L hand/forearm  Plan Discharge plan remains appropriate    Co-evaluation                 AM-PAC OT "6 Clicks" Daily  Activity     Outcome Measure   Help from another person eating meals?: A Little Help from another person taking care of personal grooming?: A Lot Help from another person toileting, which includes using toliet, bedpan, or urinal?: Total Help from another person bathing (including washing, rinsing, drying)?: Total Help from another person to put on and taking off regular upper body clothing?: Total Help from another person to put on and taking off regular lower body clothing?: Total 6 Click Score: 9    End of Session Equipment Utilized During Treatment: Oxygen  OT Visit Diagnosis: Unsteadiness on feet (R26.81);Other abnormalities of gait and mobility (R26.89);Muscle weakness (generalized) (M62.81);Other symptoms and signs involving cognitive function Pain - Right/Left:  Left Pain - part of body: Hand   Activity Tolerance Patient tolerated treatment well   Patient Left with SCD's reapplied;in bed;with call bell/phone within reach;with bed alarm set   Nurse Communication Mobility status        Time: 9702-6378 OT Time Calculation (min): 29 min  Charges: OT General Charges $OT Visit: 1 Visit OT Treatments $Therapeutic Exercise: 8-22 mins $Orthotics/Prosthetics Check: 8-22 mins  Lynnda Child, OTD, OTR/L Acute Rehab [phone] - [place] 08/06/2021, 12:22 PM

## 2021-08-06 NOTE — Op Note (Signed)
[name], [name] MEDICAL RECORD NO: [id number] ACCOUNT NO: [id number] DATE OF BIRTH: Feb 23, 1941 FACILITY: [facility] LOCATION: [facility] PHYSICIAN: Astrid Divine. Marcelino Scot, MD  Operative Report   DATE OF PROCEDURE: 08/05/2021  PREOPERATIVE DIAGNOSES: 1.  Left humeral shaft fracture. 2.  Status post external fixator placement. 3.  Compartment syndrome, left forearm, with myonecrosis and large volar and dorsal wounds.  POSTOPERATIVE DIAGNOSES: 1.  Left humeral shaft fracture. 2.  Status post external fixator placement. 3.  Compartment syndrome, left forearm, with myonecrosis and large volar and dorsal wounds.  PROCEDURE:  1.  Intramedullary nailing of the left humerus using a Synthes 8.5 x 240 mm statically locked nail. 2.  Removal of external fixator under anesthesia, left humerus. 3.  Dressing change under anesthesia, left forearm.  SURGEON:  Altamese [place], MD  ANESTHESIOLOGIST:  Ainsley Spinner, PA-C  ANESTHESIA:  General.  COMPLICATIONS:  None.  TOURNIQUET:  None.  SPECIMENS:  None.  INPUT AND OUTPUT: Please refer to the anesthetic record for detailed account.  PATIENT DISPOSITION:  To PACU.  CONDITION:  Stable.  BRIEF SUMMARY OF INDICATION OF PROCEDURE:  The patient is an 81 year old female with dementia who sustained an injury resulting in left humerus fracture, skin injury and complicated by compartment syndrome of the left arm, resulting in myonecrosis  despite fasciotomy.  Subsequent fasciotomy involved massive debridements and excisions of necrotic muscle in the volar compartment and retention suture closure involving both the volar side and the dorsal side.  The skin has been tenuous and there is  significant subcutaneous tissue exposed.  I discussed with her son the risks and benefits of further surgery for the arm including the possibility of infection or nonunion or malunion, in either case, complications such as fracture around the external  fixator pins or locking bolts, deep  infection of the forearm and multiple others.  After acknowledging these risks, she did provide consent to proceed.  We did believe that conversion to an internal device at this time juncture offers the best risk  management in the best upside.  BRIEF SUMMARY OF PROCEDURE:  The patient was given antibiotics preoperatively, taken to the operating room where general anesthesia was induced.  A bump was placed in the left side of the body and the external fixator maintained.  Chlorhexidine wash,  then Betadine scrub and paint were performed.  Timeout was held.  We began with removal of the medial aspect of the fixator including the clamps and bar there.  The forearm, it should be noted was placed into an impervious stockinette with Coban  carefully applied to control edema and prevent injury to the skin and subcutaneous tissue.  C-arm was brought in and a correct starting point identified just medial to the rotator cuff tendons in line with the center of the shaft and head.  Note was made  of the patient's significant glenohumeral arthritis, which was previously known.  Through this 2 cm incision, the starting wire was confirmed on orthogonal views and then the entry reamer used, then the ball-tip guidewire advanced down to the fracture  site.  I was unable to pass the guidewire while retaining the external fixator, and at that point, I covered the field and removed the rest of the proximal clamp and bars, the distal was capped to maintain alignment as the guide pin was passed across.   While holding in anatomic alignment, it was then reamed by hand using the 8.5 mm reamer.  My assistant controlled the arm throughout and was absolutely  necessary for the safe and effective completion of the case, which was difficult given her body  habitus and the fixator.  I did remove the pins during this process while the reduction was carefully held and then the nail advanced.  I used 3 proximal locks with headless screws  off the jig and 2 screws distally using perfect circle technique while my  assistant controlled compression and rotation of the arm.  Final images showed excellent reduction and appropriate hardware placement trajectory and length.  It should be noted that after locking the proximal screws, I did gently tap and engage and compressed the fracture site prior to  placing the distal locks.  All wounds were irrigated thoroughly and closed in standard layered fashion.  After closure of the surgical wounds proximally, dressing change was then performed while the patient remained under anesthesia of the forearm  consisting of Mepitel, gauze, Kerlix, ABDs and Ace wraps.  The patient was taken to the PACU in stable condition.  Again, Ainsley Spinner, PA-C, was present and assisting throughout.  PROGNOSIS:  The patient remains at elevated risk for complications including fracture of the arm below her fixation because of prior placement of the fixator in conjunction with the end of the nail and her impaired bone quality as well as loss of good  motor control of the elbow, hand and wrist.  We will continue to watch the wounds there. No significant function is anticipated and with the necrosis present in some of the muscles, infection risk is considerably elevated.  Contractures are anticipated  as well and occupational therapy is working to minimize these which include manual interventions as well as diligent splinting. At this time, no further surgery is anticipated.   SHW D: 08/05/2021 6:14:54 pm T: 08/06/2021 12:50:00 am  JOB: [id number]/ [id number]

## 2021-08-06 NOTE — Assessment & Plan Note (Addendum)
Compartment syndrome involved the left forearm with mild necrosis.  She has limited mobility of her left hand and fingers.  Prognosis is guarded.  Orthopedics is following. She is status post multiple surgeries including fasciotomy, irrigation and debridement.  Wound VAC was applied. Orthopedics is managing.  Defer wound care to them.

## 2021-08-06 NOTE — Assessment & Plan Note (Addendum)
Hypomagnesemia  Secondary to rhabdomyolysis.  Renal function has improved. Magnesium improved to 2.3.

## 2021-08-06 NOTE — Progress Notes (Signed)
Orthopedic Tech Progress Note Patient Details:  [name] 1940-08-07 [id number]  Called in order to [employer] for a VIVE ARM SLEEVE. Also let RN know the BLUE CARTER PILLOW are in OR and that we do not carry those  Patient ID: [name], female   DOB: 1941/02/07, 81 y.o.   MRN: [id number]  [name] 08/06/2021, 10:53 AM

## 2021-08-06 NOTE — Hospital Course (Signed)
[name] is a 81 y.o. female with medical history significant of HTN, alzheimers dementia, anemia,  anxiety and depression who presents to ED after fall with subsequent left arm trauma. History is from her son due to her dementia. Ortho consulted given compartment syndrome/humerus fracture on L side.   1/23 status post surgery on 07/27/2021.  Hemoglobin 6.0, getting 2 units of packed red blood cell transfusion this a.m. 1/24 s/p ORIF for repeat evaluation of left forearm status post fasciotomies possible debridement of nonviable necrotic muscle, possible wound closure today by orthopedics.  FOB positive, creatinine up 1/25 pain well controlled attempting to ambulate with PT later today if possible -has been difficult given her walker bound status with left arm fracture 1/26 ortho to re-evaluate left arm compartment syndrome 1/27-1/30 external fixation remains intact awaiting ORIF per their expertise - re-evaluation of compartment syndrome/wound VAC in OR 1/31 ORIF of left humerus

## 2021-08-06 NOTE — TOC Progression Note (Signed)
Transition of Care [facility]) - Progression Note    Patient Details  Name: [name] MRN: [id number] Date of Birth: Nov 12, 1940  [facility] of [facility]) CM/SW Contact  Joanne Chars, LCSW Phone Number: 08/06/2021, 11:25 AM  Clinical Narrative:   Pt back in surgery today.  Not medically ready for DC to SNF.  TOC will continue to follow.      Expected Discharge Plan: [place] Barriers to Discharge: Continued Medical Work up, SNF Pending bed offer  Expected Discharge Plan and Services Expected Discharge Plan: [place] Choice: [place] arrangements for the past 2 months: [place] ([place])                                       Social Determinants of Health (SDOH) Interventions    Readmission Risk Interventions No flowsheet data found.

## 2021-08-06 NOTE — Progress Notes (Addendum)
Orthopaedic Trauma Service Progress Note  Patient ID: [name] MRN: [id number] DOB/AGE: Mar 21, 1941 81 y.o.  Subjective:  Overall doing ok  L shoulder is sore but not too bad Burning pain in L hand    ROS As above  Objective:   VITALS:   Vitals:   08/05/21 1520 08/05/21 1931 08/05/21 2353 08/06/21 0435  BP: (!) 147/82 121/72 132/63 (!) 143/78  Pulse: 85 99 (!) 110 94  Resp: 20 19 20 18   Temp: 97.8 F (36.6 C) 97.6 F (36.4 C) 98 F (36.7 C)   TempSrc: Oral Oral Oral   SpO2: 93% 97% 97% 98%  Weight:      Height:        Estimated body mass index is 34.44 kg/m as calculated from the following:   Height as of this encounter: 5' (1.524 m).   Weight as of this encounter: 80 kg.   Intake/Output      01/31 0701 02/01 0700 02/01 0701 02/02 0700   P.O.     I.V. (mL/kg) 1675.1 (20.9)    Blood 350    IV Piggyback 300    Total Intake(mL/kg) 2325.1 (29.1)    Urine (mL/kg/hr) 3480 (1.8)    Drains     Blood 50    Total Output 3530    Net -1204.9           LABS  Results for orders placed or performed during the hospital encounter of 07/27/21 (from the past 24 hour(s))  Magnesium     Status: Abnormal   Collection Time: 08/05/21  8:57 AM  Result Value Ref Range   Magnesium 1.2 (L) 1.7 - 2.4 mg/dL  Basic metabolic panel     Status: Abnormal   Collection Time: 08/05/21  8:57 AM  Result Value Ref Range   Sodium 141 135 - 145 mmol/L   Potassium 3.3 (L) 3.5 - 5.1 mmol/L   Chloride 111 98 - 111 mmol/L   CO2 24 22 - 32 mmol/L   Glucose, Bld 103 (H) 70 - 99 mg/dL   BUN 22 8 - 23 mg/dL   Creatinine, Ser 0.71 0.44 - 1.00 mg/dL   Calcium 8.0 (L) 8.9 - 10.3 mg/dL   GFR, Estimated >60 >60 mL/min   Anion gap 6 5 - 15  CBC     Status: Abnormal   Collection Time: 08/06/21  5:10 AM  Result Value Ref Range   WBC 11.1 (H) 4.0 - 10.5 K/uL   RBC 3.34 (L) 3.87 - 5.11 MIL/uL   Hemoglobin 9.9 (L)  12.0 - 15.0 g/dL   HCT 30.2 (L) 36.0 - 46.0 %   MCV 90.4 80.0 - 100.0 fL   MCH 29.6 26.0 - 34.0 pg   MCHC 32.8 30.0 - 36.0 g/dL   RDW 19.8 (H) 11.5 - 15.5 %   Platelets 206 150 - 400 K/uL   nRBC 0.9 (H) 0.0 - 0.2 %     PHYSICAL EXAM:  Gen: sitting up in bed, eating breakfast, NAD  Lungs: unlabored  Ext:       Left Upper Extremity             dressings to L shoulder with serosanguinous strike-through   Dressings stable otherwise  Ace fitting well to forearm   Ext warm   Good perfusion distally  No sensation along radial, ulnar, median distributions at the hand   No motor function to hand or wrist   Moderate swelling to hand                 Assessment/Plan: 1 Day Post-Op      Anti-infectives (From admission, onward)    Start     Dose/Rate Route Frequency Ordered Stop   08/05/21 1615  ceFAZolin (ANCEF) IVPB 2g/100 mL premix        2 g 200 mL/hr over 30 Minutes Intravenous Every 8 hours 08/05/21 1527 08/06/21 0716   08/05/21 1115  ceFAZolin (ANCEF) IVPB 3g/100 mL premix        3 g 200 mL/hr over 30 Minutes Intravenous  Once 08/05/21 1106 08/05/21 1205   08/05/21 1110  ceFAZolin (ANCEF) 3-0.9 GM/100ML-% IVPB       Note to Pharmacy: Cameron Sprang M: cabinet override      08/05/21 1110 08/05/21 1216   08/04/21 1300  cefadroxil (DURICEF) capsule 500 mg  Status:  Discontinued        500 mg Oral 2 times daily 08/04/21 1204 08/05/21 1527   07/30/21 0330  ceFAZolin (ANCEF) IVPB 2g/100 mL premix        2 g 200 mL/hr over 30 Minutes Intravenous Every 12 hours 07/29/21 1716 07/30/21 0354   07/29/21 1630  ceFAZolin (ANCEF) IVPB 2g/100 mL premix  Status:  Discontinued        2 g 200 mL/hr over 30 Minutes Intravenous Every 8 hours 07/29/21 1250 07/29/21 1716   07/29/21 0700  ceFAZolin (ANCEF) IVPB 2g/100 mL premix        2 g 200 mL/hr over 30 Minutes Intravenous  Once 07/28/21 2155 07/29/21 0838   07/28/21 0600  ceFAZolin (ANCEF) IVPB 2g/100 mL premix  Status:  Discontinued         2 g 200 mL/hr over 30 Minutes Intravenous On call to O.R. 07/27/21 2313 07/28/21 0012   07/28/21 0020  ceFAZolin (ANCEF) IVPB 2g/100 mL premix        2 g 200 mL/hr over 30 Minutes Intravenous Every 6 hours 07/28/21 0020 07/29/21 0029   07/27/21 2201  vancomycin (VANCOCIN) powder  Status:  Discontinued          As needed 07/27/21 2202 07/27/21 2234   07/27/21 1957  ceFAZolin (ANCEF) 2-4 GM/100ML-% IVPB       Note to Pharmacy: Ubaldo Glassing M: cabinet override      07/27/21 1957 07/28/21 1583     .  POD/HD#: 1  81 y/o female with L humeral shaft fracture and L forearm compartment syndrome    -closed L humerus fracture s/p Ex fix removal and intramedullary nailing              WBAT L UEx thru elbow as needed to facilitate mobilization               dressing changes tomorrow   Will get arm sleeve and carter arm pillow for edema control   Elevate extremity     Aggressive passive motion of digits, wrist, forearm and elbow  Shoulder ROM as tolerated    - L forearm compartment syndrome likely due to prolonged external compression from mechanism of injury s/p fasciotomies and I&D             wounds stable but tenuous  mepitel, 4x4, kerlix and ace applied                                     Reinforce as needed               Fasciotomies closed                Custom brace made by OT to help maintain position of function/minimize sequela of contractures                           Extensive debridement of volar forearm compartment                          Muscles of dorsal compartment noncontractile but had good color                No ROM restrictions                          Aggressive passive motion of hand, wrist, elbow               Prognosis for left upper extremity remains guarded                 - Pain management:             continue with current regimen   Increase gabapentin due to neuropathic pain L hand    - ABL anemia/Hemodynamics              stabilizing    - Medical issues              Per primary                          AKI                                      Due to rhabdo                                      Improving    Hypomagnesemia    Noted on labs yesterday    Repeat today    Will likely need supplementation    - DVT/PE prophylaxis:             Scds             will likely restart lovenox tomorrow     - ID:              Periop abx    - Dispo:             Ortho issues stable    SNF search                     Jari Pigg, PA-C [phone] (C) 08/06/2021, 8:48 AM  [facility] [place] [phone] Jenetta [phone] (F)    After 5pm and on the weekends please log on to Amion, go to orthopaedics and the look under the [facility] Call for the provider(s) on call. You can also  call our office at [phone] and then follow the prompts to be connected to the call team.   Patient ID: [name], female   DOB: Jul 19, 1940, 81 y.o.   MRN: [id number]

## 2021-08-07 DIAGNOSIS — T79A12A Traumatic compartment syndrome of left upper extremity, initial encounter: Secondary | ICD-10-CM | POA: Diagnosis not present

## 2021-08-07 DIAGNOSIS — R4182 Altered mental status, unspecified: Secondary | ICD-10-CM | POA: Diagnosis not present

## 2021-08-07 DIAGNOSIS — G309 Alzheimer's disease, unspecified: Secondary | ICD-10-CM | POA: Diagnosis not present

## 2021-08-07 DIAGNOSIS — T79A0XA Compartment syndrome, unspecified, initial encounter: Secondary | ICD-10-CM | POA: Diagnosis not present

## 2021-08-07 DIAGNOSIS — Z7401 Bed confinement status: Secondary | ICD-10-CM | POA: Diagnosis not present

## 2021-08-07 DIAGNOSIS — D72829 Elevated white blood cell count, unspecified: Secondary | ICD-10-CM | POA: Diagnosis not present

## 2021-08-07 DIAGNOSIS — S42302A Unspecified fracture of shaft of humerus, left arm, initial encounter for closed fracture: Secondary | ICD-10-CM | POA: Diagnosis not present

## 2021-08-07 DIAGNOSIS — D649 Anemia, unspecified: Secondary | ICD-10-CM | POA: Diagnosis not present

## 2021-08-07 DIAGNOSIS — Z9181 History of falling: Secondary | ICD-10-CM | POA: Diagnosis not present

## 2021-08-07 DIAGNOSIS — G301 Alzheimer's disease with late onset: Secondary | ICD-10-CM | POA: Diagnosis not present

## 2021-08-07 DIAGNOSIS — T796XXA Traumatic ischemia of muscle, initial encounter: Secondary | ICD-10-CM | POA: Diagnosis not present

## 2021-08-07 DIAGNOSIS — I10 Essential (primary) hypertension: Secondary | ICD-10-CM | POA: Diagnosis not present

## 2021-08-07 DIAGNOSIS — S42302D Unspecified fracture of shaft of humerus, left arm, subsequent encounter for fracture with routine healing: Secondary | ICD-10-CM | POA: Diagnosis not present

## 2021-08-07 DIAGNOSIS — S42202D Unspecified fracture of upper end of left humerus, subsequent encounter for fracture with routine healing: Secondary | ICD-10-CM | POA: Diagnosis not present

## 2021-08-07 DIAGNOSIS — S42202P Unspecified fracture of upper end of left humerus, subsequent encounter for fracture with malunion: Secondary | ICD-10-CM | POA: Diagnosis not present

## 2021-08-07 DIAGNOSIS — I7121 Aneurysm of the ascending aorta, without rupture: Secondary | ICD-10-CM | POA: Diagnosis not present

## 2021-08-07 DIAGNOSIS — E785 Hyperlipidemia, unspecified: Secondary | ICD-10-CM | POA: Diagnosis not present

## 2021-08-07 DIAGNOSIS — S5782XD Crushing injury of left forearm, subsequent encounter: Secondary | ICD-10-CM | POA: Diagnosis not present

## 2021-08-07 DIAGNOSIS — N179 Acute kidney failure, unspecified: Secondary | ICD-10-CM | POA: Diagnosis not present

## 2021-08-07 LAB — BASIC METABOLIC PANEL
Anion gap: 7 (ref 5–15)
BUN: 19 mg/dL (ref 8–23)
CO2: 25 mmol/L (ref 22–32)
Calcium: 7.7 mg/dL — ABNORMAL LOW (ref 8.9–10.3)
Chloride: 106 mmol/L (ref 98–111)
Creatinine, Ser: 0.73 mg/dL (ref 0.44–1.00)
GFR, Estimated: >60 mL/min (ref >60)
Glucose, Bld: 111 mg/dL — ABNORMAL HIGH (ref 70–99)
Potassium: 4.3 mmol/L (ref 3.5–5.1)
Sodium: 138 mmol/L (ref 135–145)

## 2021-08-07 LAB — CBC
HCT: 27.5 % — ABNORMAL LOW (ref 36.0–46.0)
Hemoglobin: 8.8 g/dL — ABNORMAL LOW (ref 12.0–15.0)
MCH: 30 pg (ref 26.0–34.0)
MCHC: 32 g/dL (ref 30.0–36.0)
MCV: 93.9 fL (ref 80.0–100.0)
Platelets: 231 10*3/uL (ref 150–400)
RBC: 2.93 MIL/uL — ABNORMAL LOW (ref 3.87–5.11)
RDW: 20 % — ABNORMAL HIGH (ref 11.5–15.5)
WBC: 9.5 10*3/uL (ref 4.0–10.5)
nRBC: 0.7 % — ABNORMAL HIGH (ref 0.0–0.2)

## 2021-08-07 LAB — MAGNESIUM: Magnesium: 2.3 mg/dL (ref 1.7–2.4)

## 2021-08-07 LAB — SARS CORONAVIRUS 2 (TAT 6-24 HRS): SARS Coronavirus 2: NEGATIVE

## 2021-08-07 MED ORDER — VITAMIN D3 25 MCG PO TABS: 2000.0000 [IU] | ORAL_TABLET | Freq: Two times a day (BID) | ORAL | Status: AC

## 2021-08-07 MED ORDER — ASCORBIC ACID 1000 MG PO TABS: 1000.0000 mg | ORAL_TABLET | Freq: Every day | ORAL | Status: AC

## 2021-08-07 MED ORDER — ZINC SULFATE 220 (50 ZN) MG PO CAPS: 220.0000 mg | ORAL_CAPSULE | Freq: Every day | ORAL | Status: DC

## 2021-08-07 MED ORDER — CARVEDILOL 3.125 MG PO TABS: 3.1250 mg | ORAL_TABLET | Freq: Two times a day (BID) | ORAL | Status: AC

## 2021-08-07 MED ORDER — SENNA 8.6 MG PO TABS: 1.0000 | ORAL_TABLET | Freq: Two times a day (BID) | ORAL | 0 refills | Status: AC

## 2021-08-07 MED ORDER — GABAPENTIN 100 MG PO CAPS: 200.0000 mg | ORAL_CAPSULE | Freq: Three times a day (TID) | ORAL | Status: AC

## 2021-08-07 MED ORDER — PANTOPRAZOLE SODIUM 40 MG PO TBEC: 40.0000 mg | DELAYED_RELEASE_TABLET | Freq: Two times a day (BID) | ORAL | Status: AC

## 2021-08-07 MED ORDER — POLYETHYLENE GLYCOL 3350 17 G PO PACK: 17.0000 g | PACK | Freq: Every day | ORAL | 0 refills | Status: AC | PRN

## 2021-08-07 MED ORDER — DOCUSATE SODIUM 100 MG PO CAPS: 100.0000 mg | ORAL_CAPSULE | Freq: Two times a day (BID) | ORAL | 0 refills | Status: AC

## 2021-08-07 MED ORDER — HYDROCODONE-ACETAMINOPHEN 5-325 MG PO TABS: 1.0000 | ORAL_TABLET | ORAL | 0 refills | Status: AC | PRN

## 2021-08-07 MED ORDER — ACETAMINOPHEN 325 MG PO TABS: 650.0000 mg | ORAL_TABLET | Freq: Three times a day (TID) | ORAL | Status: AC

## 2021-08-07 NOTE — Progress Notes (Signed)
Occupational Therapy Treatment Patient Details Name: [name] MRN: [id number] DOB: October 20, 1940 Today's Date: 08/07/2021   History of present illness Patient is a 81 y/o female who presents on 07/27/21 with left arm trauma after getting LUE stuck in a dresser. Found to have left humerus fx and rhabdomyolosis. s/p fasciotomy, external fixator and wound vac placement 07/27/21. S/p ORIF on 1/31. PMH includes dementia, osteoporosis, anxiety, depression, HTN, skin ca, HTN.   OT comments  ROM of L elbow and shoulder improved as noted below. No active movement wrist/hand. Son present and educated on exercises, positioning and splint use. Pt to continue with rehab at SNF.   Recommendations for follow up therapy are one component of a multi-disciplinary discharge planning process, led by the attending physician.  Recommendations may be updated based on patient status, additional functional criteria and insurance authorization.    Follow Up Recommendations  Skilled nursing-short term rehab (<3 hours/day)    Assistance Recommended at Discharge Frequent or constant Supervision/Assistance  Patient can return home with the following  Two people to help with walking and/or transfers;A lot of help with bathing/dressing/bathroom;Assistance with cooking/housework;Assist for transportation;Help with stairs or ramp for entrance   Equipment Recommendations  BSC/3in1    Recommendations for Other Services      Precautions / Restrictions Precautions Precautions: Fall Precaution Comments: A/AA/PROM ROM as tolerated LUE Other Brace: L resting hand splint Restrictions Weight Bearing Restrictions: Yes LUE Weight Bearing: Weight bear through elbow only Other Position/Activity Restrictions: 2/1 per Ortho PA note: WBAT L UEx thru elbow as needed to facilitate mobilization , aggressive hand/wrist/elbow ROM, shoulder ROM as tolerated       Mobility Bed Mobility                    Transfers                          Balance                                           ADL either performed or assessed with clinical judgement   ADL Overall ADL's : Needs assistance/impaired                                       General ADL Comments: focus of session on ROM    Extremity/Trunk Assessment Upper Extremity Assessment RUE Deficits / Details: generalized weakness; weaker shoulder; encouraged A/AAROM with son LUE Deficits / Details: LUE as tolerated; No active movemetn hand/wrist; AAROM elbow adn shoulder as tolerated due to pain and shoulder; Shoulder able to achieve @ 90FF adn 45Abd; ER to neutral; elbow extension  - full; flex limited by bulky bandage and edema            Vision       Perception     Praxis      Cognition Arousal/Alertness: Awake/alert Behavior During Therapy: Flat affect Overall Cognitive Status: History of cognitive impairments - at baseline                                 General Comments: son reports at baseline today        Exercises General Exercises - Upper Extremity  Shoulder Flexion: Left, AROM, 10 reps, Supine Shoulder ABduction: AROM, AAROM, Left, 10 reps, Supine Elbow Flexion: AROM, AAROM, Left, 10 reps Elbow Extension: AROM, AAROM, Left, 10 reps Wrist Flexion: PROM, Left, 10 reps Wrist Extension: PROM, Left, 10 reps Digit Composite Flexion: Left, 20 reps, PROM Composite Extension: PROM, Left, 15 reps Other Exercises Other Exercises: education on retrograde massage    Shoulder Instructions       General Comments      Pertinent Vitals/ Pain       Pain Assessment Pain Assessment: Faces Faces Pain Scale: Hurts little more Pain Location: LUE Pain Descriptors / Indicators: Grimacing, Sore, Discomfort Pain Intervention(s): Limited activity within patient's tolerance  Home Living                                          Prior Functioning/Environment               Frequency  Min 3X/week        Progress Toward Goals  OT Goals(current goals can now be found in the care plan section)  Progress towards OT goals: Progressing toward goals  Acute Rehab OT Goals Patient Stated Goal: per son to go to rehab and then ALF OT Goal Formulation: With patient Time For Goal Achievement: 08/11/21 Potential to Achieve Goals: Fair ADL Goals Pt Will Perform Grooming: with min assist;sitting Pt Will Perform Upper Body Dressing: with min assist;sitting Pt Will Transfer to Toilet: with mod assist;with +2 assist;stand pivot transfer Pt/caregiver will Perform Home Exercise Program: Left upper extremity;Increased ROM;With written HEP provided;With minimal assist Additional ADL Goal #1: Pt will tolerate L resting hadn splint for 2 hour intervals to reduce risk of contracture formation Additional ADL Goal #2: Family will demonstrate appropirate positioning to decrease dependent edema L hand/forearm  Plan Discharge plan remains appropriate    Co-evaluation                 AM-PAC OT "6 Clicks" Daily Activity     Outcome Measure   Help from another person eating meals?: A Little Help from another person taking care of personal grooming?: A Lot Help from another person toileting, which includes using toliet, bedpan, or urinal?: Total Help from another person bathing (including washing, rinsing, drying)?: Total Help from another person to put on and taking off regular upper body clothing?: Total Help from another person to put on and taking off regular lower body clothing?: Total 6 Click Score: 9    End of Session    OT Visit Diagnosis: Unsteadiness on feet (R26.81);Other abnormalities of gait and mobility (R26.89);Muscle weakness (generalized) (M62.81);Other symptoms and signs involving cognitive function Pain - Right/Left: Left Pain - part of body: Hand   Activity Tolerance Patient tolerated treatment well   Patient Left in bed;with call  bell/phone within reach;with bed alarm set;with family/visitor present   Nurse Communication Precautions;Weight bearing status        Time: 0865-7846 OT Time Calculation (min): 29 min  Charges: OT General Charges $OT Visit: 1 Visit OT Treatments $Therapeutic Activity: 8-22 mins $Therapeutic Exercise: 8-22 mins  Maurie Boettcher, OT/L   Acute OT Clinical Specialist Acute Rehabilitation Services Pager [phone] Office [phone]   [facility] 08/07/2021, 12:53 PM

## 2021-08-07 NOTE — Progress Notes (Signed)
Physical Therapy Treatment Patient Details Name: [name] MRN: [id number] DOB: 31-May-1941 Today's Date: 08/07/2021   History of Present Illness Patient is a 81 y/o female who presents on 07/27/21 with left arm trauma after getting LUE stuck in a dresser. Found to have left humerus fx and rhabdomyolosis. s/p fasciotomy, external fixator and wound vac placement 07/27/21. S/p ORIF on 1/31. PMH includes dementia, osteoporosis, anxiety, depression, HTN, skin ca, HTN.    PT Comments    Pt received in supine, agreeable to therapy session and with good participation and tolerance for supine LUE/BLE exercises and rolling for clean-up due to soiled bed (pt unaware of need for clean-up). Pt needing +2 maxA for rolling to L/R sides and extra (third) staff member present to ensure safe positioning/comfort of LUE with rolling. After completion of peri-care, PTAR arrived to transport patient to next facility, so unable to assess seated balance today due to limited session time. Pt continues to benefit from PT services to progress toward functional mobility goals.    Recommendations for follow up therapy are one component of a multi-disciplinary discharge planning process, led by the attending physician.  Recommendations may be updated based on patient status, additional functional criteria and insurance authorization.  Follow Up Recommendations  Skilled nursing-short term rehab (<3 hours/day)     Assistance Recommended at Discharge Frequent or constant Supervision/Assistance  Patient can return home with the following Two people to help with walking and/or transfers;Two people to help with bathing/dressing/bathroom;Assistance with cooking/housework;Assistance with feeding;Direct supervision/assist for medications management;Assist for transportation;Help with stairs or ramp for entrance   Equipment Recommendations  None recommended by PT    Recommendations for Other Services       Precautions /  Restrictions Precautions Precautions: Fall Precaution Comments: A/AA/PROM ROM as tolerated LUE Required Braces or Orthoses: Other Brace Other Brace: L resting hand splint Restrictions Weight Bearing Restrictions: Yes LUE Weight Bearing: Weight bear through elbow only Other Position/Activity Restrictions: 2/1 per Ortho PA note: WBAT L UEx thru elbow as needed to facilitate mobilization, aggressive hand/wrist/elbow ROM, shoulder ROM as tolerated     Mobility  Bed Mobility Overal bed mobility: Needs Assistance Bed Mobility: Rolling Rolling: Max assist, +2 for physical assistance         General bed mobility comments: initiated rolling to L/R sides for clean-up and guarding of LUE/safety for placement of transfer pad prior to pt being placed on gurney for discharge.          Cognition Arousal/Alertness: Awake/alert Behavior During Therapy: WFL for tasks assessed/performed Overall Cognitive Status: History of cognitive impairments - at baseline Area of Impairment: Safety/judgement, Memory, Awareness, Problem solving         Memory: Decreased recall of precautions, Decreased short-term memory   Safety/Judgement: Decreased awareness of safety Awareness: Intellectual Problem Solving: Decreased initiation, Difficulty sequencing, Requires verbal cues, Requires tactile cues, Slow processing General Comments: son reports she is closer to baseline today, pt alert/cooperative        Exercises General Exercises - Upper Extremity Shoulder Flexion: Left, 10 reps, Supine, AAROM Elbow Flexion: AAROM, Left, 10 reps Elbow Extension: AAROM, Left, 10 reps Wrist Flexion: PROM, Left, 10 reps Wrist Extension: PROM, Left, 10 reps Digit Composite Flexion: Left, PROM, 10 reps Composite Extension: PROM, Left, 10 reps General Exercises - Lower Extremity Heel Slides: AAROM, Both, 5 reps, Supine Other Exercises Other Exercises: education on retrograde massage for B hands and positioning for  edema mgmt Other Exercises: BLE AAROM: hip abduction, ankle pumps, SLR x5  reps ea Other Exercises: supine LUE AAROM: shoulder gentle IR/ER in pain-free range with elbow flexed to 90 deg x10 reps        Pertinent Vitals/Pain Pain Assessment Pain Assessment: 0-10 Pain Score: 6  Pain Location: LUE and LLE Pain Descriptors / Indicators: Grimacing, Sore, Discomfort Pain Intervention(s): Limited activity within patient's tolerance, Monitored during session, Premedicated before session, Repositioned     PT Goals (current goals can now be found in the care plan section) Acute Rehab PT Goals Patient Stated Goal: to move better, less pain PT Goal Formulation: Patient unable to participate in goal setting Time For Goal Achievement: 08/11/21 Progress towards PT goals: Progressing toward goals    Frequency    Min 2X/week      PT Plan Current plan remains appropriate       AM-PAC PT "6 Clicks" Mobility   Outcome Measure  Help needed turning from your back to your side while in a flat bed without using bedrails?: Total Help needed moving from lying on your back to sitting on the side of a flat bed without using bedrails?: Total Help needed moving to and from a bed to a chair (including a wheelchair)?: Total Help needed standing up from a chair using your arms (e.g., wheelchair or bedside chair)?: Total Help needed to walk in hospital room?: Total Help needed climbing 3-5 steps with a railing? : Total 6 Click Score: 6    End of Session Equipment Utilized During Treatment: Oxygen Activity Tolerance: Patient tolerated treatment well Patient left: in bed;with call bell/phone within reach;with bed alarm set;with nursing/sitter in room (staff preparing pt for PTAR transport) Nurse Communication: Mobility status PT Visit Diagnosis: Pain;Muscle weakness (generalized) (M62.81);Other abnormalities of gait and mobility (R26.89) Pain - Right/Left: Left Pain - part of body:  Shoulder;Arm;Hand;Hip;Knee     Time: 4503-8882 PT Time Calculation (min) (ACUTE ONLY): 19 min  Charges:  $Therapeutic Exercise: 8-22 mins                     Sanyah Molnar P., PTA Acute Rehabilitation Services Pager: [phone] Office: [place] 08/07/2021, 3:01 PM

## 2021-08-07 NOTE — Care Management Important Message (Signed)
Important Message  Patient Details  Name: [name] MRN: [id number] Date of Birth: 11-10-1940   Medicare Important Message Given:  Other (see comment)     Hannah Beat 08/07/2021, 12:56 PM

## 2021-08-07 NOTE — Progress Notes (Addendum)
Orthopaedic Trauma Service Progress Note  Patient ID: [name] MRN: [id number] DOB/AGE: 07-09-1940 81 y.o.  Subjective:  No acute issues  SNF today    ROS As above  Objective:   VITALS:   Vitals:   08/06/21 1700 08/06/21 2009 08/07/21 0414 08/07/21 0857  BP: (!) 131/57 (!) 121/55 (!) 112/51 (!) 128/58  Pulse: 93 92  96  Resp: 20 (!) 22 16 20   Temp: 98 F (36.7 C)   98.2 F (36.8 C)  TempSrc: Oral   Oral  SpO2:  99% 99% 96%  Weight:      Height:        Estimated body mass index is 34.44 kg/m as calculated from the following:   Height as of this encounter: 5' (1.524 m).   Weight as of this encounter: 80 kg.   Intake/Output      02/01 0701 02/02 0700 02/02 0701 02/03 0700   P.O. 155    I.V. (mL/kg)     Blood     Other  650   IV Piggyback     Total Intake(mL/kg) 155 (1.9) 650 (8.1)   Urine (mL/kg/hr) 750 (0.4)    Stool 0    Blood     Total Output 750    Net -595 +650        Stool Occurrence 2 x      LABS  Results for orders placed or performed during the hospital encounter of 07/27/21 (from the past 24 hour(s))  SARS CORONAVIRUS 2 (TAT 6-24 HRS) Nasopharyngeal Nasopharyngeal Swab     Status: None   Collection Time: 08/06/21 12:48 PM   Specimen: Nasopharyngeal Swab  Result Value Ref Range   SARS Coronavirus 2 NEGATIVE NEGATIVE  CBC     Status: Abnormal   Collection Time: 08/07/21  1:59 AM  Result Value Ref Range   WBC 9.5 4.0 - 10.5 K/uL   RBC 2.93 (L) 3.87 - 5.11 MIL/uL   Hemoglobin 8.8 (L) 12.0 - 15.0 g/dL   HCT 27.5 (L) 36.0 - 46.0 %   MCV 93.9 80.0 - 100.0 fL   MCH 30.0 26.0 - 34.0 pg   MCHC 32.0 30.0 - 36.0 g/dL   RDW 20.0 (H) 11.5 - 15.5 %   Platelets 231 150 - 400 K/uL   nRBC 0.7 (H) 0.0 - 0.2 %  Basic metabolic panel     Status: Abnormal   Collection Time: 08/07/21  1:59 AM  Result Value Ref Range   Sodium 138 135 - 145 mmol/L   Potassium 4.3 3.5 - 5.1  mmol/L   Chloride 106 98 - 111 mmol/L   CO2 25 22 - 32 mmol/L   Glucose, Bld 111 (H) 70 - 99 mg/dL   BUN 19 8 - 23 mg/dL   Creatinine, Ser 0.73 0.44 - 1.00 mg/dL   Calcium 7.7 (L) 8.9 - 10.3 mg/dL   GFR, Estimated >60 >60 mL/min   Anion gap 7 5 - 15  Magnesium     Status: None   Collection Time: 08/07/21  1:59 AM  Result Value Ref Range   Magnesium 2.3 1.7 - 2.4 mg/dL     PHYSICAL EXAM:   Gen: sitting up in bed, NAD Lungs: unlabored  Ext:       Left Upper Extremity  dressings to L shoulder changed. All incisions look great             All wounds are stable, no evidence of infection to wound on forearm                          Flap volar forearm is stable but remains tenuous                         Mid aspect of volar flap has some dusky tissue noted                                     Not grossly necrotic but ? Viability              No motor or sensory noted to hand or wrist              Ext warm              Good perfusion distally              No sensation along radial, ulnar, median distributions at the hand              No motor function to hand or wrist              Moderate swelling to hand   Assessment/Plan: 2 Days Post-Op   Principal Problem:   Left humeral fracture Active Problems:   Essential hypertension   Traumatic rhabdomyolysis (HCC)   Alzheimer disease (HCC)   Leukocytosis   Anemia   Thoracic ascending aortic aneurysm   Hyperlipidemia   Compartment syndrome ([place])   AKI (acute kidney injury) ([place])   Anti-infectives (From admission, onward)    Start     Dose/Rate Route Frequency Ordered Stop   08/05/21 1615  ceFAZolin (ANCEF) IVPB 2g/100 mL premix        2 g 200 mL/hr over 30 Minutes Intravenous Every 8 hours 08/05/21 1527 08/06/21 0716   08/05/21 1115  ceFAZolin (ANCEF) IVPB 3g/100 mL premix        3 g 200 mL/hr over 30 Minutes Intravenous  Once 08/05/21 1106 08/05/21 1205   08/05/21 1110  ceFAZolin (ANCEF) 3-0.9 GM/100ML-% IVPB        Note to Pharmacy: Cameron Sprang M: cabinet override      08/05/21 1110 08/05/21 1216   08/04/21 1300  cefadroxil (DURICEF) capsule 500 mg  Status:  Discontinued        500 mg Oral 2 times daily 08/04/21 1204 08/05/21 1527   07/30/21 0330  ceFAZolin (ANCEF) IVPB 2g/100 mL premix        2 g 200 mL/hr over 30 Minutes Intravenous Every 12 hours 07/29/21 1716 07/30/21 0354   07/29/21 1630  ceFAZolin (ANCEF) IVPB 2g/100 mL premix  Status:  Discontinued        2 g 200 mL/hr over 30 Minutes Intravenous Every 8 hours 07/29/21 1250 07/29/21 1716   07/29/21 0700  ceFAZolin (ANCEF) IVPB 2g/100 mL premix        2 g 200 mL/hr over 30 Minutes Intravenous  Once 07/28/21 2155 07/29/21 0838   07/28/21 0600  ceFAZolin (ANCEF) IVPB 2g/100 mL premix  Status:  Discontinued        2 g 200 mL/hr over 30 Minutes Intravenous On call to O.R. 07/27/21 2313  07/28/21 0012   07/28/21 0020  ceFAZolin (ANCEF) IVPB 2g/100 mL premix        2 g 200 mL/hr over 30 Minutes Intravenous Every 6 hours 07/28/21 0020 07/29/21 0029   07/27/21 2201  vancomycin (VANCOCIN) powder  Status:  Discontinued          As needed 07/27/21 2202 07/27/21 2234   07/27/21 1957  ceFAZolin (ANCEF) 2-4 GM/100ML-% IVPB       Note to Pharmacy: Ubaldo Glassing M: cabinet override      07/27/21 1957 07/28/21 8315     .  POD/HD#: 2  81 y/o female with L humeral shaft fracture and L forearm compartment syndrome    -closed L humerus fracture s/p Ex fix removal and intramedullary nailing              WBAT L UEx thru elbow as needed to facilitate mobilization                dressing changes tomorrow              Will get arm sleeve and carter arm pillow for edema control              Elevate extremity                           Aggressive passive motion of digits, wrist, forearm and elbow             Shoulder ROM as tolerated    - L forearm compartment syndrome likely due to prolonged external compression from mechanism of injury s/p  fasciotomies and I&D             wounds stable but tenuous                          mepitel retained, 4x4, kerlix and ace applied                                     Reinforce as needed   Change dressing every other day     Leave mepitel layer down (silicone dressing on the skin), change 4x4 gauze, kerlix and ace               Custom brace made by OT to help maintain position of function/minimize sequela of contractures                           Extensive debridement of volar forearm compartment                          Muscles of dorsal compartment noncontractile but had good color     Pt to wear L hand splint 2 hrs on/2 hrs off. KEEP LUE ELEVATED AT ALL TIMES WITH HAND ABOVE ELBOW TO DECREASE EDEMA. KEEP YELLOW FOAM BETWEEN EXT FIX AND CHEST TO REDUCE RISK OF SKIN BREAKDOWN.               No ROM restrictions                          Aggressive passive motion of hand, wrist, elbow               Prognosis for left  upper extremity remains guarded                 - Pain management:             continue with current regimen              Increase gabapentin due to neuropathic pain L hand    - ABL anemia/Hemodynamics             stabilizing    - Medical issues              Per primary                          AKI                                      Due to rhabdo                                      Improving                Hypomagnesemia                          Noted on labs yesterday                          Repeat today                          Will likely need supplementation    - DVT/PE prophylaxis:             Scds             will likely restart lovenox tomorrow     - ID:              Periop abx    - Dispo:             SNF today   Follow up 08/13/2021  Jari Pigg, PA-C [phone] (C) 08/07/2021, 12:32 PM  [facility] [place] [phone] Jenetta [phone] (F)    After 5pm and on the weekends please log on  to Amion, go to orthopaedics and the look under the [facility] Call for the provider(s) on call. You can also call our office at [phone] and then follow the prompts to be connected to the call team.   Patient ID: [name], female   DOB: 1940-11-17, 81 y.o.   MRN: [id number]

## 2021-08-07 NOTE — TOC Transition Note (Addendum)
Transition of Care [facility]) - CM/SW Discharge Note   Patient Details  Name: Yesenia Locurto MRN: [id number] Date of Birth: 12-31-1940  [facility] CM/SW Contact:  Joanne Chars, LCSW Phone Number: 08/07/2021, 10:40 AM   Clinical Narrative:   Pt discharging to [facility].  RN call [phone] for report.      Final next level of care: Skilled Nursing Facility Barriers to Discharge: Barriers Resolved   Patient Goals and CMS Choice   CMS Medicare.gov Compare Post Acute Care list provided to:: Patient Represenative (must comment) Choice offered to / list presented to : Adult Children  Discharge Placement              Patient chooses bed at:  [facility]) Patient to be transferred to facility by: [place] Name of family member notified: son Jonni Sanger Patient and family notified of of transfer: 08/07/21  Discharge Plan and Services     Post Acute Care Choice: [place]                               Social Determinants of Health (SDOH) Interventions     Readmission Risk Interventions No flowsheet data found.

## 2021-08-07 NOTE — Progress Notes (Signed)
Attempted x1 to give report to RN at [facility], unsuccessful.

## 2021-08-07 NOTE — Discharge Summary (Signed)
[facility]  Physician Discharge Summary   Patient ID: [name] MRN: [id number] DOB/AGE: 81/26/1942 81 y.o.  Admit date: 07/27/2021 Discharge date:   08/07/2021   PCP: Orpah Melter, MD  DISCHARGE DIAGNOSES:  Principal Problem:   Left humeral fracture Active Problems:   Traumatic rhabdomyolysis ([place])   Compartment syndrome ([place])   AKI (acute kidney injury) ([place])   Anemia   Essential hypertension   Alzheimer disease ([place])   Thoracic ascending aortic aneurysm   Leukocytosis   Hyperlipidemia    RECOMMENDATIONS FOR OUTPATIENT FOLLOW UP: Follow-up with Dr. Marcelino Scot with orthopedics in the next 1 week. CBC and basic metabolic panel along with magnesium level in 3 to 4 days    Home Health: Going to SNF Equipment/Devices: None  CODE STATUS: DNR  DISCHARGE CONDITION: fair  Diet recommendation: Regular as tolerated  INITIAL HISTORY: [name] is a 81 y.o. female with medical history significant of HTN, alzheimers dementia, anemia,  anxiety and depression who presents to ED after fall with subsequent left arm trauma. History is from her son due to her dementia. Ortho consulted given compartment syndrome/humerus fracture on L side.   1/23 status post surgery on 07/27/2021.  Hemoglobin 6.0, getting 2 units of packed red blood cell transfusion this a.m. 1/24 s/p ORIF for repeat evaluation of left forearm status post fasciotomies possible debridement of nonviable necrotic muscle, possible wound closure today by orthopedics.  FOB positive, creatinine up 1/25 pain well controlled attempting to ambulate with PT later today if possible -has been difficult given her walker bound status with left arm fracture 1/26 ortho to re-evaluate left arm compartment syndrome 1/27-1/30 external fixation remains intact awaiting ORIF per their expertise - re-evaluation of compartment syndrome/wound VAC in OR 1/31 ORIF of left humerus  Consultations: Dr. Marcelino Scot with  orthopedics.    HOSPITAL COURSE:   * Left humeral fracture- (present on admission) Now status post intramedullary nailing of the left humerus on 1/31.  Needs ongoing rehab.  Compartment syndrome ([place])- (present on admission) Compartment syndrome involved the left forearm with mild necrosis.  She has limited mobility of her left hand and fingers.  Prognosis is guarded.  Orthopedics is following. She is status post multiple surgeries including fasciotomy, irrigation and debridement.  Wound VAC was applied. Orthopedics is managing.  Defer wound care to them.  Traumatic rhabdomyolysis ([place])- (present on admission) CK on arrival 23,294.  Patient was aggressively hydrated.  CK started trending down.  Electrolytes have been stable.  AKI (acute kidney injury) ([place])- (present on admission) Hypomagnesemia  Secondary to rhabdomyolysis.  Renal function has improved. Magnesium improved to 2.3.  Anemia- (present on admission) Baseline hgb around 10.7 two months ago.  Drop in hemoglobin was noted at the time of admission. Looks like she was transfused 2 units of PRBC on 1/31.  Previously required 3 units.   Total of 5 units have been transfused during this hospital stay. CBC will need to be monitored at the skilled nursing facility  Essential hypertension- (present on admission) Resume carvedilol at a lower dose.  Hold other antihypertensives  Alzheimer disease ([place])- (present on admission) At baseline, delirium precautions.  Thoracic ascending aortic aneurysm- (present on admission) Continue to monitor in the outpatient setting.  Hyperlipidemia Continue to hold statin for now.    Obesity Estimated body mass index is 34.44 kg/m as calculated from the following:   Height as of this encounter: 5' (1.524 m).   Weight as of this encounter: 80 kg.  Patient is stable.  Okay for discharge to skilled nursing facility today.   PERTINENT LABS:  The results of significant diagnostics from  this hospitalization (including imaging, microbiology, ancillary and laboratory) are listed below for reference.    Microbiology: Recent Results (from the past 240 hour(s))  SARS CORONAVIRUS 2 (TAT 6-24 HRS) Nasopharyngeal Nasopharyngeal Swab     Status: None   Collection Time: 08/06/21 12:48 PM   Specimen: Nasopharyngeal Swab  Result Value Ref Range Status   SARS Coronavirus 2 NEGATIVE NEGATIVE Final    Comment: (NOTE) SARS-CoV-2 target nucleic acids are NOT DETECTED.  The SARS-CoV-2 RNA is generally detectable in upper and lower respiratory specimens during the acute phase of infection. Negative results do not preclude SARS-CoV-2 infection, do not rule out co-infections with other pathogens, and should not be used as the sole basis for treatment or other patient management decisions. Negative results must be combined with clinical observations, patient history, and epidemiological information. The expected result is Negative.  Fact Sheet for Patients: SugarRoll.be  Fact Sheet for Healthcare Providers: https://www.woods-mathews.com/  This test is not yet approved or cleared by the [place] FDA and  has been authorized for detection and/or diagnosis of SARS-CoV-2 by FDA under an Emergency Use Authorization (EUA). This EUA will remain  in effect (meaning this test can be used) for the duration of the COVID-19 declaration under Se ction 564(b)(1) of the Act, 21 U.S.C. section 360bbb-3(b)(1), unless the authorization is terminated or revoked sooner.  Performed at [facility], [place] [address] [place], [place] [address]      Labs:  COVID-19 Labs   Lab Results  Component Value Date   SARSCOV2NAA NEGATIVE 08/06/2021   [place] NEGATIVE 07/27/2021   [place] NEGATIVE 05/01/2021   [place] NEGATIVE 04/27/2021      Basic Metabolic Panel: Recent Labs  Lab 08/03/21 0556 08/05/21 0448 08/05/21 0857  08/06/21 1132 08/07/21 0159  NA 137 139 141 140 138  K 3.9 3.2* 3.3* 3.8 4.3  CL 109 110 111 108 106  CO2 20* 21* 24 25 25   GLUCOSE 147* 104* 103* 128* 111*  BUN 40* 23 22 20 19   CREATININE 1.54* 0.70 0.71 0.87 0.73  CALCIUM 7.7* 8.0* 8.0* 7.9* 7.7*  MG  --   --  1.2* 1.3* 2.3    CBC: Recent Labs  Lab 08/02/21 0441 08/03/21 0556 08/05/21 0448 08/06/21 0510 08/07/21 0159  WBC 8.5 10.4 8.4 11.1* 9.5  HGB 7.6* 7.9* 7.8* 9.9* 8.8*  HCT 25.0* 25.3* 25.4* 30.2* 27.5*  MCV 92.9 93.4 95.1 90.4 93.9  PLT 307 340 288 206 231   Cardiac Enzymes: Recent Labs  Lab 08/01/21 0134  CKTOTAL 2,016*   BNP: BNP (last 3 results) Recent Labs    08/19/20 1944 07/27/21 1236 07/29/21 1707  BNP 706.8* 951.9* 198.9*     IMAGING STUDIES DG Chest 1 View  Result Date: 07/27/2021 CLINICAL DATA:  Unwitnessed fall EXAM: CHEST  1 VIEW COMPARISON:  04/27/2021 FINDINGS: Heart size and vascularity normal. Lungs clear without infiltrate or effusion. Moderate to advanced degenerative change in both shoulders. IMPRESSION: No active disease. Electronically Signed   By: Franchot Gallo M.D.   On: 07/27/2021 12:56   DG Elbow Complete Left  Result Date: 07/27/2021 CLINICAL DATA:  Fall.  Deformity EXAM: LEFT ELBOW - COMPLETE 3+ VIEW COMPARISON:  None. FINDINGS: There is no evidence of fracture, dislocation, or joint effusion. There is no evidence of arthropathy or other focal bone abnormality. Soft tissues are unremarkable. IMPRESSION: Negative.  Electronically Signed   By: Franchot Gallo M.D.   On: 07/27/2021 12:52   DG Forearm Left  Result Date: 07/27/2021 CLINICAL DATA:  Fall. EXAM: LEFT FOREARM - 2 VIEW COMPARISON:  None. FINDINGS: There is no evidence of fracture or other focal bone lesions. Soft tissues are unremarkable. IMPRESSION: Negative. Electronically Signed   By: Franchot Gallo M.D.   On: 07/27/2021 12:53   US RENAL  Result Date: 07/29/2021 CLINICAL DATA:  Renal dysfunction EXAM: RENAL /  URINARY TRACT ULTRASOUND COMPLETE COMPARISON:  None. FINDINGS: Right Kidney: Renal measurements: 9 x 5.8 x 5 cm = volume: 137.7 mL. There is no hydronephrosis. Cortical echogenicity is unremarkable. Evaluation is technically difficult due to patient's body habitus. Left Kidney: Renal measurements: 11.2 x 6.3 x 5.3 cm = volume: 193.4 mL. Evaluation is technically limited. As far as seen, there is no hydronephrosis or perinephric fluid collection. Cortical echogenicity is unremarkable. Bladder: Urinary bladder is empty and not evaluated. Other: None. IMPRESSION: There is no hydronephrosis. Electronically Signed   By: Elmer Picker M.D.   On: 07/29/2021 18:18   DG Humerus Left  Result Date: 08/06/2021 CLINICAL DATA:  Fracture fixation EXAM: LEFT HUMERUS - 2+ VIEW COMPARISON:  None. FINDINGS: Intramedullary rod with screw fixation spanning left humeral shaft fracture. There is near anatomic alignment. IMPRESSION: Post fixation of left humeral shaft fracture with near anatomic alignment. Electronically Signed   By: Macy Mis M.D.   On: 08/06/2021 11:19   DG Humerus Left  Result Date: 08/05/2021 CLINICAL DATA:  ORIF of left humeral fracture. EXAM: LEFT HUMERUS - 2+ VIEW COMPARISON:  Radiographs dated July 27, 2021 FINDINGS: Multiple intraoperative fluoroscopic images for intramedullary nail placement for midshaft humeral fracture now in near anatomical alignment. Total fluoroscopic time was 2 minutes 16 seconds. Total radiation dose was 19.63 mGy. IMPRESSION: Intraoperative utilization of fluoroscopy. Electronically Signed   By: Keane Police D.O.   On: 08/05/2021 13:40   DG Humerus Left  Result Date: 08/04/2021 CLINICAL DATA:  External fixation.  Check alignment. EXAM: LEFT HUMERUS - 2+ VIEW COMPARISON:  07/27/2021 FINDINGS: No significant change since the study of 8 days ago. There is a 4-5 mm gap separating proximal in distal fragments. Distal fragment is again seen to be 8-9 mm lateral relative  to the proximal fragment, about half the with of the bone. IMPRESSION: No change.  Minor offsets as above. Electronically Signed   By: Nelson Chimes M.D.   On: 08/04/2021 14:12   DG Humerus Left  Result Date: 07/27/2021 CLINICAL DATA:  Left humeral fracture, postoperative examination EXAM: LEFT HUMERUS - 2+ VIEW COMPARISON:  None. FINDINGS: Acute transverse fracture of the a mid diaphysis of the left humerus is again identified. External fixator has been applied. There is 1/2 shaft with posterolateral displacement of the distal fracture fragment and distraction of the fracture fragments by approximately 3-4 mm. Normal alignment. IMPRESSION: Interval external fixation of mid diaphyseal left humeral fracture as described above. Electronically Signed   By: Fidela Salisbury M.D.   On: 07/27/2021 23:39   DG Humerus Left  Result Date: 07/27/2021 CLINICAL DATA:  Internal fixation of the left humerus. EXAM: LEFT HUMERUS - 2+ VIEW COMPARISON:  Radiographs dated 07/27/2021. FINDINGS: Seven intraoperative fluoroscopic images provided. The total fluoroscopic time is 1 minutes 15 seconds with total air kerma of 1.89 mGy. There has been internal fixation of left humeral fracture. IMPRESSION: Intraoperative fluoroscopic images of left humeral fixation. Electronically Signed   By: Anner Crete  M.D.   On: 07/27/2021 22:10   DG Humerus Left  Result Date: 07/27/2021 CLINICAL DATA:  Fall EXAM: LEFT HUMERUS - 2+ VIEW COMPARISON:  None. FINDINGS: Acute transverse fracture mid shaft of left humerus with angulation and mild displacement. Advanced degenerative changes left shoulder joint. IMPRESSION: Acute fracture left mid humerus. Electronically Signed   By: Franchot Gallo M.D.   On: 07/27/2021 12:55   DG C-Arm 1-60 Min-No Report  Result Date: 08/05/2021 Fluoroscopy was utilized by the requesting physician.  No radiographic interpretation.   DG C-Arm 1-60 Min-No Report  Result Date: 08/05/2021 Fluoroscopy was utilized  by the requesting physician.  No radiographic interpretation.   DG C-Arm 1-60 Min-No Report  Result Date: 07/27/2021 Fluoroscopy was utilized by the requesting physician.  No radiographic interpretation.   DG HIP UNILAT WITH PELVIS 2-3 VIEWS LEFT  Result Date: 07/27/2021 CLINICAL DATA:  Fall EXAM: DG HIP (WITH OR WITHOUT PELVIS) 2-3V LEFT COMPARISON:  04/27/2021 FINDINGS: Right hip replacement in satisfactory alignment. No fracture or complication. Advanced degenerative change left hip with joint space narrowing, spurring, sclerosis and collapse of the femoral head. Negative for acute fracture Calcification aorta and iliac arteries. Bilateral fallopian tube clips. IMPRESSION: Negative for acute fracture. Advanced degenerative change left hip. Electronically Signed   By: Franchot Gallo M.D.   On: 07/27/2021 12:54    DISCHARGE EXAMINATION: Vitals:   08/06/21 1700 08/06/21 2009 08/07/21 0414 08/07/21 0857  BP: (!) 131/57 (!) 121/55 (!) 112/51 (!) 128/58  Pulse: 93 92  96  Resp: 20 (!) 22 16 20   Temp: 98 F (36.7 C)   98.2 F (36.8 C)  TempSrc: Oral   Oral  SpO2:  99% 99% 96%  Weight:      Height:       General appearance: Awake alert.  In no distress Resp: Clear to auscultation bilaterally.  Normal effort Cardio: S1-S2 is normal regular.  No S3-S4.  No rubs murmurs or bruit GI: Abdomen is soft.  Nontender nondistended.  Bowel sounds are present normal.  No masses organomegaly   DISPOSITION: SNF  Discharge Instructions     Call MD for:  difficulty breathing, headache or visual disturbances   Complete by: As directed    Call MD for:  extreme fatigue   Complete by: As directed    Call MD for:  persistant dizziness or light-headedness   Complete by: As directed    Call MD for:  persistant nausea and vomiting   Complete by: As directed    Call MD for:  redness, tenderness, or signs of infection (pain, swelling, redness, odor or green/yellow discharge around incision site)   Complete  by: As directed    Call MD for:  severe uncontrolled pain   Complete by: As directed    Call MD for:  temperature >100.4   Complete by: As directed    Discharge instructions   Complete by: As directed    Please review instructions on the discharge summary.  You were cared for by a hospitalist during your hospital stay. If you have any questions about your discharge medications or the care you received while you were in the hospital after you are discharged, you can call the unit and asked to speak with the hospitalist on call if the hospitalist that took care of you is not available. Once you are discharged, your primary care physician will handle any further medical issues. Please note that NO REFILLS for any discharge medications will be authorized once  you are discharged, as it is imperative that you return to your primary care physician (or establish a relationship with a primary care physician if you do not have one) for your aftercare needs so that they can reassess your need for medications and monitor your lab values. If you do not have a primary care physician, you can call [phone] for a physician referral.   Discharge wound care:   Complete by: As directed    Dressing changes as per orthopedics.,  Dr. Marcelino Scot.   Increase activity slowly   Complete by: As directed          Allergies as of 08/07/2021       Reactions   Molds & Smuts    Other Other (See Comments)   Dust mites - Upper respiratory congestion   Statins    myalgias        Medication List     STOP taking these medications    amLODipine 5 MG tablet Commonly known as: NORVASC   aspirin 325 MG tablet   rosuvastatin 10 MG tablet Commonly known as: CRESTOR       TAKE these medications    acetaminophen 325 MG tablet Commonly known as: TYLENOL Take 2 tablets (650 mg total) by mouth every 8 (eight) hours for 5 days.   ascorbic acid 1000 MG tablet Commonly known as: VITAMIN C Take 1 tablet (1,000 mg  total) by mouth daily.   carvedilol 3.125 MG tablet Commonly known as: COREG Take 1 tablet (3.125 mg total) by mouth 2 (two) times daily with a meal. What changed:  medication strength how much to take   docusate sodium 100 MG capsule Commonly known as: COLACE Take 1 capsule (100 mg total) by mouth 2 (two) times daily.   gabapentin 100 MG capsule Commonly known as: NEURONTIN Take 2 capsules (200 mg total) by mouth 3 (three) times daily.   HYDROcodone-acetaminophen 5-325 MG tablet Commonly known as: NORCO/VICODIN Take 1-2 tablets by mouth every 4 (four) hours as needed for moderate pain or severe pain.   levocetirizine 5 MG tablet Commonly known as: XYZAL Take 5 mg by mouth daily as needed for allergies.   pantoprazole 40 MG tablet Commonly known as: PROTONIX Take 1 tablet (40 mg total) by mouth 2 (two) times daily before a meal.   polyethylene glycol 17 g packet Commonly known as: MIRALAX / GLYCOLAX Take 17 g by mouth daily as needed for mild constipation.   senna 8.6 MG Tabs tablet Commonly known as: SENOKOT Take 1 tablet (8.6 mg total) by mouth 2 (two) times daily.   Vitamin D3 25 MCG tablet Commonly known as: Vitamin D Take 2 tablets (2,000 Units total) by mouth 2 (two) times daily.   zinc sulfate 220 (50 Zn) MG capsule Take 1 capsule (220 mg total) by mouth daily.               Discharge Care Instructions  (From admission, onward)           Start     Ordered   08/07/21 0000  Discharge wound care:       Comments: Dressing changes as per orthopedics.,  Dr. Marcelino Scot.   08/07/21 1018              Follow-up Information     Orpah Melter, MD. Schedule an appointment as soon as possible for a visit in 3 week(s).   Specialty: Family Medicine Contact information: [place]  [place], Michael, MD. Schedule an appointment as soon as possible for a visit in 1 week(s).   Specialty: Orthopedic  Surgery Contact information: [place] [address] [phone]                 TOTAL DISCHARGE TIME: 35-minutes  [facility] Pager on [email or web address]  08/07/2021, 10:21 AM

## 2021-08-07 NOTE — Progress Notes (Signed)
2/2 Pt discharged, IM Letter mailed to patient's address. Initial and a follow up IM Letter have been rcv'd.

## 2021-08-08 ENCOUNTER — Encounter (HOSPITAL_COMMUNITY): Payer: Self-pay | Admitting: Orthopedic Surgery

## 2021-08-12 DIAGNOSIS — S42302D Unspecified fracture of shaft of humerus, left arm, subsequent encounter for fracture with routine healing: Secondary | ICD-10-CM | POA: Diagnosis not present

## 2021-08-12 DIAGNOSIS — E785 Hyperlipidemia, unspecified: Secondary | ICD-10-CM | POA: Diagnosis not present

## 2021-08-12 DIAGNOSIS — I10 Essential (primary) hypertension: Secondary | ICD-10-CM | POA: Diagnosis not present

## 2021-08-12 DIAGNOSIS — G301 Alzheimer's disease with late onset: Secondary | ICD-10-CM | POA: Diagnosis not present

## 2021-08-13 DIAGNOSIS — D649 Anemia, unspecified: Secondary | ICD-10-CM | POA: Diagnosis not present

## 2021-08-13 DIAGNOSIS — S42202D Unspecified fracture of upper end of left humerus, subsequent encounter for fracture with routine healing: Secondary | ICD-10-CM | POA: Diagnosis not present

## 2021-08-13 DIAGNOSIS — G309 Alzheimer's disease, unspecified: Secondary | ICD-10-CM | POA: Diagnosis not present

## 2021-08-14 DIAGNOSIS — S42202D Unspecified fracture of upper end of left humerus, subsequent encounter for fracture with routine healing: Secondary | ICD-10-CM | POA: Diagnosis not present

## 2021-08-14 DIAGNOSIS — Z9181 History of falling: Secondary | ICD-10-CM | POA: Diagnosis not present

## 2021-08-14 DIAGNOSIS — G309 Alzheimer's disease, unspecified: Secondary | ICD-10-CM | POA: Diagnosis not present

## 2021-08-14 NOTE — Anesthesia Postprocedure Evaluation (Signed)
Anesthesia Post Note  Patient: [name]  Procedure(s) Performed: IRRIGATION AND DEBRIDEMENT EXTREMITY (Left)     Patient location during evaluation: PACU Anesthesia Type: General Level of consciousness: awake Pain management: pain level controlled Vital Signs Assessment: post-procedure vital signs reviewed and stable Respiratory status: spontaneous breathing Cardiovascular status: stable Postop Assessment: no apparent nausea or vomiting Anesthetic complications: no   No notable events documented.  Last Vitals:  Vitals:   08/07/21 0414 08/07/21 0857  BP: (!) 112/51 (!) 128/58  Pulse:  96  Resp: 16 20  Temp:  36.8 C  SpO2: 99% 96%    Last Pain:  Vitals:   08/07/21 0857  TempSrc: Oral  PainSc: [name]

## 2021-08-20 DIAGNOSIS — S42302D Unspecified fracture of shaft of humerus, left arm, subsequent encounter for fracture with routine healing: Secondary | ICD-10-CM | POA: Diagnosis not present

## 2021-08-20 DIAGNOSIS — S5782XD Crushing injury of left forearm, subsequent encounter: Secondary | ICD-10-CM | POA: Diagnosis not present

## 2021-08-25 DIAGNOSIS — Z87440 Personal history of urinary (tract) infections: Secondary | ICD-10-CM | POA: Diagnosis not present

## 2021-08-25 DIAGNOSIS — S42202D Unspecified fracture of upper end of left humerus, subsequent encounter for fracture with routine healing: Secondary | ICD-10-CM | POA: Diagnosis not present

## 2021-08-25 DIAGNOSIS — M79602 Pain in left arm: Secondary | ICD-10-CM | POA: Diagnosis not present

## 2021-08-25 DIAGNOSIS — G309 Alzheimer's disease, unspecified: Secondary | ICD-10-CM | POA: Diagnosis not present

## 2021-08-26 DIAGNOSIS — N390 Urinary tract infection, site not specified: Secondary | ICD-10-CM | POA: Diagnosis not present

## 2021-08-28 DIAGNOSIS — G309 Alzheimer's disease, unspecified: Secondary | ICD-10-CM | POA: Diagnosis not present

## 2021-08-28 DIAGNOSIS — Z87440 Personal history of urinary (tract) infections: Secondary | ICD-10-CM | POA: Diagnosis not present

## 2021-08-28 DIAGNOSIS — N390 Urinary tract infection, site not specified: Secondary | ICD-10-CM | POA: Diagnosis not present

## 2021-09-09 DIAGNOSIS — Z960 Presence of urogenital implants: Secondary | ICD-10-CM | POA: Diagnosis not present

## 2021-09-09 DIAGNOSIS — S42202D Unspecified fracture of upper end of left humerus, subsequent encounter for fracture with routine healing: Secondary | ICD-10-CM | POA: Diagnosis not present

## 2021-09-09 DIAGNOSIS — R279 Unspecified lack of coordination: Secondary | ICD-10-CM | POA: Diagnosis not present

## 2021-09-09 DIAGNOSIS — F32A Depression, unspecified: Secondary | ICD-10-CM | POA: Diagnosis not present

## 2021-09-10 DIAGNOSIS — R279 Unspecified lack of coordination: Secondary | ICD-10-CM | POA: Diagnosis not present

## 2021-09-10 DIAGNOSIS — N390 Urinary tract infection, site not specified: Secondary | ICD-10-CM | POA: Diagnosis not present

## 2021-09-10 DIAGNOSIS — L309 Dermatitis, unspecified: Secondary | ICD-10-CM | POA: Diagnosis not present

## 2021-09-10 DIAGNOSIS — S42202D Unspecified fracture of upper end of left humerus, subsequent encounter for fracture with routine healing: Secondary | ICD-10-CM | POA: Diagnosis not present

## 2021-09-10 DIAGNOSIS — R339 Retention of urine, unspecified: Secondary | ICD-10-CM | POA: Diagnosis not present

## 2021-09-11 DIAGNOSIS — R279 Unspecified lack of coordination: Secondary | ICD-10-CM | POA: Diagnosis not present

## 2021-09-11 DIAGNOSIS — S42202D Unspecified fracture of upper end of left humerus, subsequent encounter for fracture with routine healing: Secondary | ICD-10-CM | POA: Diagnosis not present

## 2021-09-12 DIAGNOSIS — R279 Unspecified lack of coordination: Secondary | ICD-10-CM | POA: Diagnosis not present

## 2021-09-12 DIAGNOSIS — S42202D Unspecified fracture of upper end of left humerus, subsequent encounter for fracture with routine healing: Secondary | ICD-10-CM | POA: Diagnosis not present

## 2021-09-15 DIAGNOSIS — F01B0 Vascular dementia, moderate, without behavioral disturbance, psychotic disturbance, mood disturbance, and anxiety: Secondary | ICD-10-CM | POA: Diagnosis not present

## 2021-09-15 DIAGNOSIS — F02B0 Dementia in other diseases classified elsewhere, moderate, without behavioral disturbance, psychotic disturbance, mood disturbance, and anxiety: Secondary | ICD-10-CM | POA: Diagnosis not present

## 2021-09-15 DIAGNOSIS — R279 Unspecified lack of coordination: Secondary | ICD-10-CM | POA: Diagnosis not present

## 2021-09-15 DIAGNOSIS — G301 Alzheimer's disease with late onset: Secondary | ICD-10-CM | POA: Diagnosis not present

## 2021-09-15 DIAGNOSIS — S42202D Unspecified fracture of upper end of left humerus, subsequent encounter for fracture with routine healing: Secondary | ICD-10-CM | POA: Diagnosis not present

## 2021-09-16 DIAGNOSIS — R5381 Other malaise: Secondary | ICD-10-CM | POA: Diagnosis not present

## 2021-09-16 DIAGNOSIS — Z87440 Personal history of urinary (tract) infections: Secondary | ICD-10-CM | POA: Diagnosis not present

## 2021-09-16 DIAGNOSIS — Z7689 Persons encountering health services in other specified circumstances: Secondary | ICD-10-CM | POA: Diagnosis not present

## 2021-09-16 DIAGNOSIS — N390 Urinary tract infection, site not specified: Secondary | ICD-10-CM | POA: Diagnosis not present

## 2021-09-16 DIAGNOSIS — R279 Unspecified lack of coordination: Secondary | ICD-10-CM | POA: Diagnosis not present

## 2021-09-16 DIAGNOSIS — S42202D Unspecified fracture of upper end of left humerus, subsequent encounter for fracture with routine healing: Secondary | ICD-10-CM | POA: Diagnosis not present

## 2021-09-17 DIAGNOSIS — S5782XD Crushing injury of left forearm, subsequent encounter: Secondary | ICD-10-CM | POA: Diagnosis not present

## 2021-09-17 DIAGNOSIS — S42202D Unspecified fracture of upper end of left humerus, subsequent encounter for fracture with routine healing: Secondary | ICD-10-CM | POA: Diagnosis not present

## 2021-09-17 DIAGNOSIS — T79A12D Traumatic compartment syndrome of left upper extremity, subsequent encounter: Secondary | ICD-10-CM | POA: Diagnosis not present

## 2021-09-17 DIAGNOSIS — S42302D Unspecified fracture of shaft of humerus, left arm, subsequent encounter for fracture with routine healing: Secondary | ICD-10-CM | POA: Diagnosis not present

## 2021-09-17 DIAGNOSIS — R279 Unspecified lack of coordination: Secondary | ICD-10-CM | POA: Diagnosis not present

## 2021-09-18 DIAGNOSIS — S42202D Unspecified fracture of upper end of left humerus, subsequent encounter for fracture with routine healing: Secondary | ICD-10-CM | POA: Diagnosis not present

## 2021-09-18 DIAGNOSIS — R279 Unspecified lack of coordination: Secondary | ICD-10-CM | POA: Diagnosis not present

## 2021-09-19 DIAGNOSIS — S42202D Unspecified fracture of upper end of left humerus, subsequent encounter for fracture with routine healing: Secondary | ICD-10-CM | POA: Diagnosis not present

## 2021-09-19 DIAGNOSIS — R279 Unspecified lack of coordination: Secondary | ICD-10-CM | POA: Diagnosis not present

## 2021-09-22 DIAGNOSIS — R279 Unspecified lack of coordination: Secondary | ICD-10-CM | POA: Diagnosis not present

## 2021-09-22 DIAGNOSIS — F03B3 Unspecified dementia, moderate, with mood disturbance: Secondary | ICD-10-CM | POA: Diagnosis not present

## 2021-09-22 DIAGNOSIS — S42202D Unspecified fracture of upper end of left humerus, subsequent encounter for fracture with routine healing: Secondary | ICD-10-CM | POA: Diagnosis not present

## 2021-09-23 DIAGNOSIS — S42202D Unspecified fracture of upper end of left humerus, subsequent encounter for fracture with routine healing: Secondary | ICD-10-CM | POA: Diagnosis not present

## 2021-09-23 DIAGNOSIS — R279 Unspecified lack of coordination: Secondary | ICD-10-CM | POA: Diagnosis not present

## 2021-09-24 DIAGNOSIS — S41102D Unspecified open wound of left upper arm, subsequent encounter: Secondary | ICD-10-CM | POA: Diagnosis not present

## 2021-09-24 DIAGNOSIS — R279 Unspecified lack of coordination: Secondary | ICD-10-CM | POA: Diagnosis not present

## 2021-09-24 DIAGNOSIS — R600 Localized edema: Secondary | ICD-10-CM | POA: Diagnosis not present

## 2021-09-24 DIAGNOSIS — S42202D Unspecified fracture of upper end of left humerus, subsequent encounter for fracture with routine healing: Secondary | ICD-10-CM | POA: Diagnosis not present

## 2021-09-25 DIAGNOSIS — S42202D Unspecified fracture of upper end of left humerus, subsequent encounter for fracture with routine healing: Secondary | ICD-10-CM | POA: Diagnosis not present

## 2021-09-25 DIAGNOSIS — S41102D Unspecified open wound of left upper arm, subsequent encounter: Secondary | ICD-10-CM | POA: Diagnosis not present

## 2021-09-25 DIAGNOSIS — R600 Localized edema: Secondary | ICD-10-CM | POA: Diagnosis not present

## 2021-09-25 DIAGNOSIS — R279 Unspecified lack of coordination: Secondary | ICD-10-CM | POA: Diagnosis not present

## 2021-09-26 DIAGNOSIS — S42202D Unspecified fracture of upper end of left humerus, subsequent encounter for fracture with routine healing: Secondary | ICD-10-CM | POA: Diagnosis not present

## 2021-09-26 DIAGNOSIS — R279 Unspecified lack of coordination: Secondary | ICD-10-CM | POA: Diagnosis not present

## 2021-09-29 DIAGNOSIS — S42202D Unspecified fracture of upper end of left humerus, subsequent encounter for fracture with routine healing: Secondary | ICD-10-CM | POA: Diagnosis not present

## 2021-09-29 DIAGNOSIS — R279 Unspecified lack of coordination: Secondary | ICD-10-CM | POA: Diagnosis not present

## 2021-09-30 DIAGNOSIS — S42202D Unspecified fracture of upper end of left humerus, subsequent encounter for fracture with routine healing: Secondary | ICD-10-CM | POA: Diagnosis not present

## 2021-09-30 DIAGNOSIS — R279 Unspecified lack of coordination: Secondary | ICD-10-CM | POA: Diagnosis not present

## 2021-10-01 DIAGNOSIS — S42202D Unspecified fracture of upper end of left humerus, subsequent encounter for fracture with routine healing: Secondary | ICD-10-CM | POA: Diagnosis not present

## 2021-10-01 DIAGNOSIS — R279 Unspecified lack of coordination: Secondary | ICD-10-CM | POA: Diagnosis not present

## 2021-10-02 DIAGNOSIS — M79602 Pain in left arm: Secondary | ICD-10-CM | POA: Diagnosis not present

## 2021-10-02 DIAGNOSIS — G309 Alzheimer's disease, unspecified: Secondary | ICD-10-CM | POA: Diagnosis not present

## 2021-10-02 DIAGNOSIS — S42202D Unspecified fracture of upper end of left humerus, subsequent encounter for fracture with routine healing: Secondary | ICD-10-CM | POA: Diagnosis not present

## 2021-10-02 DIAGNOSIS — S51002A Unspecified open wound of left elbow, initial encounter: Secondary | ICD-10-CM | POA: Diagnosis not present

## 2021-10-02 DIAGNOSIS — R2232 Localized swelling, mass and lump, left upper limb: Secondary | ICD-10-CM | POA: Diagnosis not present

## 2021-10-02 DIAGNOSIS — R279 Unspecified lack of coordination: Secondary | ICD-10-CM | POA: Diagnosis not present

## 2021-10-02 DIAGNOSIS — L03114 Cellulitis of left upper limb: Secondary | ICD-10-CM | POA: Diagnosis not present

## 2021-10-03 DIAGNOSIS — Z7409 Other reduced mobility: Secondary | ICD-10-CM | POA: Diagnosis not present

## 2021-10-03 DIAGNOSIS — R600 Localized edema: Secondary | ICD-10-CM | POA: Diagnosis not present

## 2021-10-04 DIAGNOSIS — R279 Unspecified lack of coordination: Secondary | ICD-10-CM | POA: Diagnosis not present

## 2021-10-04 DIAGNOSIS — S42202D Unspecified fracture of upper end of left humerus, subsequent encounter for fracture with routine healing: Secondary | ICD-10-CM | POA: Diagnosis not present

## 2021-10-06 DIAGNOSIS — S42202D Unspecified fracture of upper end of left humerus, subsequent encounter for fracture with routine healing: Secondary | ICD-10-CM | POA: Diagnosis not present

## 2021-10-06 DIAGNOSIS — R279 Unspecified lack of coordination: Secondary | ICD-10-CM | POA: Diagnosis not present

## 2021-10-07 DIAGNOSIS — S51002D Unspecified open wound of left elbow, subsequent encounter: Secondary | ICD-10-CM | POA: Diagnosis not present

## 2021-10-07 DIAGNOSIS — R279 Unspecified lack of coordination: Secondary | ICD-10-CM | POA: Diagnosis not present

## 2021-10-07 DIAGNOSIS — G309 Alzheimer's disease, unspecified: Secondary | ICD-10-CM | POA: Diagnosis not present

## 2021-10-07 DIAGNOSIS — L03114 Cellulitis of left upper limb: Secondary | ICD-10-CM | POA: Diagnosis not present

## 2021-10-07 DIAGNOSIS — S42202D Unspecified fracture of upper end of left humerus, subsequent encounter for fracture with routine healing: Secondary | ICD-10-CM | POA: Diagnosis not present

## 2021-10-07 DIAGNOSIS — R451 Restlessness and agitation: Secondary | ICD-10-CM | POA: Diagnosis not present

## 2021-10-08 DIAGNOSIS — S42202D Unspecified fracture of upper end of left humerus, subsequent encounter for fracture with routine healing: Secondary | ICD-10-CM | POA: Diagnosis not present

## 2021-10-08 DIAGNOSIS — S42302D Unspecified fracture of shaft of humerus, left arm, subsequent encounter for fracture with routine healing: Secondary | ICD-10-CM | POA: Diagnosis not present

## 2021-10-08 DIAGNOSIS — R279 Unspecified lack of coordination: Secondary | ICD-10-CM | POA: Diagnosis not present

## 2021-10-09 DIAGNOSIS — R451 Restlessness and agitation: Secondary | ICD-10-CM | POA: Diagnosis not present

## 2021-10-09 DIAGNOSIS — S42202D Unspecified fracture of upper end of left humerus, subsequent encounter for fracture with routine healing: Secondary | ICD-10-CM | POA: Diagnosis not present

## 2021-10-09 DIAGNOSIS — G309 Alzheimer's disease, unspecified: Secondary | ICD-10-CM | POA: Diagnosis not present

## 2021-10-09 DIAGNOSIS — F411 Generalized anxiety disorder: Secondary | ICD-10-CM | POA: Diagnosis not present

## 2021-10-09 DIAGNOSIS — S41102D Unspecified open wound of left upper arm, subsequent encounter: Secondary | ICD-10-CM | POA: Diagnosis not present

## 2021-10-09 DIAGNOSIS — R279 Unspecified lack of coordination: Secondary | ICD-10-CM | POA: Diagnosis not present

## 2021-10-10 DIAGNOSIS — N390 Urinary tract infection, site not specified: Secondary | ICD-10-CM | POA: Diagnosis not present

## 2021-10-10 DIAGNOSIS — R279 Unspecified lack of coordination: Secondary | ICD-10-CM | POA: Diagnosis not present

## 2021-10-10 DIAGNOSIS — S42202D Unspecified fracture of upper end of left humerus, subsequent encounter for fracture with routine healing: Secondary | ICD-10-CM | POA: Diagnosis not present

## 2021-10-13 DIAGNOSIS — F03B3 Unspecified dementia, moderate, with mood disturbance: Secondary | ICD-10-CM | POA: Diagnosis not present

## 2021-10-13 DIAGNOSIS — Z7689 Persons encountering health services in other specified circumstances: Secondary | ICD-10-CM | POA: Diagnosis not present

## 2021-10-13 DIAGNOSIS — G309 Alzheimer's disease, unspecified: Secondary | ICD-10-CM | POA: Diagnosis not present

## 2021-10-15 DIAGNOSIS — R279 Unspecified lack of coordination: Secondary | ICD-10-CM | POA: Diagnosis not present

## 2021-10-15 DIAGNOSIS — S42202D Unspecified fracture of upper end of left humerus, subsequent encounter for fracture with routine healing: Secondary | ICD-10-CM | POA: Diagnosis not present

## 2021-10-16 ENCOUNTER — Encounter (HOSPITAL_BASED_OUTPATIENT_CLINIC_OR_DEPARTMENT_OTHER): Payer: Medicare PPO | Attending: Internal Medicine | Admitting: Internal Medicine

## 2021-10-16 DIAGNOSIS — R279 Unspecified lack of coordination: Secondary | ICD-10-CM | POA: Diagnosis not present

## 2021-10-16 DIAGNOSIS — S42202D Unspecified fracture of upper end of left humerus, subsequent encounter for fracture with routine healing: Secondary | ICD-10-CM | POA: Diagnosis not present

## 2021-10-17 DIAGNOSIS — S42202D Unspecified fracture of upper end of left humerus, subsequent encounter for fracture with routine healing: Secondary | ICD-10-CM | POA: Diagnosis not present

## 2021-10-17 DIAGNOSIS — R279 Unspecified lack of coordination: Secondary | ICD-10-CM | POA: Diagnosis not present

## 2021-10-18 DIAGNOSIS — S42202D Unspecified fracture of upper end of left humerus, subsequent encounter for fracture with routine healing: Secondary | ICD-10-CM | POA: Diagnosis not present

## 2021-10-18 DIAGNOSIS — R279 Unspecified lack of coordination: Secondary | ICD-10-CM | POA: Diagnosis not present

## 2021-10-20 DIAGNOSIS — S42202D Unspecified fracture of upper end of left humerus, subsequent encounter for fracture with routine healing: Secondary | ICD-10-CM | POA: Diagnosis not present

## 2021-10-20 DIAGNOSIS — R279 Unspecified lack of coordination: Secondary | ICD-10-CM | POA: Diagnosis not present

## 2021-10-21 DIAGNOSIS — R279 Unspecified lack of coordination: Secondary | ICD-10-CM | POA: Diagnosis not present

## 2021-10-21 DIAGNOSIS — S42202D Unspecified fracture of upper end of left humerus, subsequent encounter for fracture with routine healing: Secondary | ICD-10-CM | POA: Diagnosis not present

## 2021-10-22 DIAGNOSIS — S42302D Unspecified fracture of shaft of humerus, left arm, subsequent encounter for fracture with routine healing: Secondary | ICD-10-CM | POA: Diagnosis not present

## 2021-10-22 DIAGNOSIS — T79A12D Traumatic compartment syndrome of left upper extremity, subsequent encounter: Secondary | ICD-10-CM | POA: Diagnosis not present

## 2021-10-22 DIAGNOSIS — S5782XD Crushing injury of left forearm, subsequent encounter: Secondary | ICD-10-CM | POA: Diagnosis not present

## 2021-10-23 DIAGNOSIS — R279 Unspecified lack of coordination: Secondary | ICD-10-CM | POA: Diagnosis not present

## 2021-10-23 DIAGNOSIS — S42202D Unspecified fracture of upper end of left humerus, subsequent encounter for fracture with routine healing: Secondary | ICD-10-CM | POA: Diagnosis not present

## 2021-10-27 DIAGNOSIS — F01B0 Vascular dementia, moderate, without behavioral disturbance, psychotic disturbance, mood disturbance, and anxiety: Secondary | ICD-10-CM | POA: Diagnosis not present

## 2021-10-27 DIAGNOSIS — F02B0 Dementia in other diseases classified elsewhere, moderate, without behavioral disturbance, psychotic disturbance, mood disturbance, and anxiety: Secondary | ICD-10-CM | POA: Diagnosis not present

## 2021-10-27 DIAGNOSIS — G301 Alzheimer's disease with late onset: Secondary | ICD-10-CM | POA: Diagnosis not present

## 2021-10-28 DIAGNOSIS — I10 Essential (primary) hypertension: Secondary | ICD-10-CM | POA: Diagnosis not present

## 2021-10-28 DIAGNOSIS — S42202D Unspecified fracture of upper end of left humerus, subsequent encounter for fracture with routine healing: Secondary | ICD-10-CM | POA: Diagnosis not present

## 2021-10-28 DIAGNOSIS — G894 Chronic pain syndrome: Secondary | ICD-10-CM | POA: Diagnosis not present

## 2021-10-28 DIAGNOSIS — G309 Alzheimer's disease, unspecified: Secondary | ICD-10-CM | POA: Diagnosis not present

## 2021-10-28 DIAGNOSIS — K219 Gastro-esophageal reflux disease without esophagitis: Secondary | ICD-10-CM | POA: Diagnosis not present

## 2021-10-28 DIAGNOSIS — Z6831 Body mass index (BMI) 31.0-31.9, adult: Secondary | ICD-10-CM | POA: Diagnosis not present

## 2021-11-24 DIAGNOSIS — F01B0 Vascular dementia, moderate, without behavioral disturbance, psychotic disturbance, mood disturbance, and anxiety: Secondary | ICD-10-CM | POA: Diagnosis not present

## 2021-11-24 DIAGNOSIS — G301 Alzheimer's disease with late onset: Secondary | ICD-10-CM | POA: Diagnosis not present

## 2021-11-24 DIAGNOSIS — F02B0 Dementia in other diseases classified elsewhere, moderate, without behavioral disturbance, psychotic disturbance, mood disturbance, and anxiety: Secondary | ICD-10-CM | POA: Diagnosis not present

## 2021-11-26 DIAGNOSIS — G309 Alzheimer's disease, unspecified: Secondary | ICD-10-CM | POA: Diagnosis not present

## 2021-11-26 DIAGNOSIS — Z7689 Persons encountering health services in other specified circumstances: Secondary | ICD-10-CM | POA: Diagnosis not present

## 2021-11-26 DIAGNOSIS — Z79899 Other long term (current) drug therapy: Secondary | ICD-10-CM | POA: Diagnosis not present

## 2021-11-26 DIAGNOSIS — D649 Anemia, unspecified: Secondary | ICD-10-CM | POA: Diagnosis not present

## 2021-12-10 DIAGNOSIS — T79A12D Traumatic compartment syndrome of left upper extremity, subsequent encounter: Secondary | ICD-10-CM | POA: Diagnosis not present

## 2021-12-10 DIAGNOSIS — S42302D Unspecified fracture of shaft of humerus, left arm, subsequent encounter for fracture with routine healing: Secondary | ICD-10-CM | POA: Diagnosis not present

## 2021-12-15 DIAGNOSIS — G301 Alzheimer's disease with late onset: Secondary | ICD-10-CM | POA: Diagnosis not present

## 2021-12-15 DIAGNOSIS — F02B0 Dementia in other diseases classified elsewhere, moderate, without behavioral disturbance, psychotic disturbance, mood disturbance, and anxiety: Secondary | ICD-10-CM | POA: Diagnosis not present

## 2021-12-15 DIAGNOSIS — F01B0 Vascular dementia, moderate, without behavioral disturbance, psychotic disturbance, mood disturbance, and anxiety: Secondary | ICD-10-CM | POA: Diagnosis not present

## 2022-01-16 DIAGNOSIS — I10 Essential (primary) hypertension: Secondary | ICD-10-CM | POA: Diagnosis not present

## 2022-01-16 DIAGNOSIS — K5900 Constipation, unspecified: Secondary | ICD-10-CM | POA: Diagnosis not present

## 2022-01-16 DIAGNOSIS — Z6830 Body mass index (BMI) 30.0-30.9, adult: Secondary | ICD-10-CM | POA: Diagnosis not present

## 2022-01-16 DIAGNOSIS — K219 Gastro-esophageal reflux disease without esophagitis: Secondary | ICD-10-CM | POA: Diagnosis not present

## 2022-01-16 DIAGNOSIS — R52 Pain, unspecified: Secondary | ICD-10-CM | POA: Diagnosis not present

## 2022-01-16 DIAGNOSIS — F03B11 Unspecified dementia, moderate, with agitation: Secondary | ICD-10-CM | POA: Diagnosis not present

## 2022-01-16 DIAGNOSIS — F331 Major depressive disorder, recurrent, moderate: Secondary | ICD-10-CM | POA: Diagnosis not present

## 2022-01-20 DIAGNOSIS — Z79899 Other long term (current) drug therapy: Secondary | ICD-10-CM | POA: Diagnosis not present

## 2022-01-20 DIAGNOSIS — R531 Weakness: Secondary | ICD-10-CM | POA: Diagnosis not present

## 2022-01-20 DIAGNOSIS — E785 Hyperlipidemia, unspecified: Secondary | ICD-10-CM | POA: Diagnosis not present

## 2022-01-20 DIAGNOSIS — D649 Anemia, unspecified: Secondary | ICD-10-CM | POA: Diagnosis not present

## 2022-01-20 DIAGNOSIS — E559 Vitamin D deficiency, unspecified: Secondary | ICD-10-CM | POA: Diagnosis not present

## 2022-01-20 DIAGNOSIS — E569 Vitamin deficiency, unspecified: Secondary | ICD-10-CM | POA: Diagnosis not present

## 2022-01-20 DIAGNOSIS — R778 Other specified abnormalities of plasma proteins: Secondary | ICD-10-CM | POA: Diagnosis not present

## 2022-01-21 DIAGNOSIS — N181 Chronic kidney disease, stage 1: Secondary | ICD-10-CM | POA: Diagnosis not present

## 2022-01-21 DIAGNOSIS — Z7689 Persons encountering health services in other specified circumstances: Secondary | ICD-10-CM | POA: Diagnosis not present

## 2022-01-26 DIAGNOSIS — F02B0 Dementia in other diseases classified elsewhere, moderate, without behavioral disturbance, psychotic disturbance, mood disturbance, and anxiety: Secondary | ICD-10-CM | POA: Diagnosis not present

## 2022-01-26 DIAGNOSIS — G301 Alzheimer's disease with late onset: Secondary | ICD-10-CM | POA: Diagnosis not present

## 2022-01-26 DIAGNOSIS — F01B0 Vascular dementia, moderate, without behavioral disturbance, psychotic disturbance, mood disturbance, and anxiety: Secondary | ICD-10-CM | POA: Diagnosis not present

## 2022-02-03 DIAGNOSIS — E569 Vitamin deficiency, unspecified: Secondary | ICD-10-CM | POA: Diagnosis not present

## 2022-02-03 DIAGNOSIS — D649 Anemia, unspecified: Secondary | ICD-10-CM | POA: Diagnosis not present

## 2022-02-03 DIAGNOSIS — E785 Hyperlipidemia, unspecified: Secondary | ICD-10-CM | POA: Diagnosis not present

## 2022-02-03 DIAGNOSIS — C4490 Unspecified malignant neoplasm of skin, unspecified: Secondary | ICD-10-CM | POA: Diagnosis not present

## 2022-02-11 DIAGNOSIS — F332 Major depressive disorder, recurrent severe without psychotic features: Secondary | ICD-10-CM | POA: Diagnosis not present

## 2022-02-11 DIAGNOSIS — F03B11 Unspecified dementia, moderate, with agitation: Secondary | ICD-10-CM | POA: Diagnosis not present

## 2022-02-18 DIAGNOSIS — M6281 Muscle weakness (generalized): Secondary | ICD-10-CM | POA: Diagnosis not present

## 2022-02-18 DIAGNOSIS — R2689 Other abnormalities of gait and mobility: Secondary | ICD-10-CM | POA: Diagnosis not present

## 2022-02-18 DIAGNOSIS — R52 Pain, unspecified: Secondary | ICD-10-CM | POA: Diagnosis not present

## 2022-02-18 DIAGNOSIS — Z9181 History of falling: Secondary | ICD-10-CM | POA: Diagnosis not present

## 2022-02-18 DIAGNOSIS — W19XXXA Unspecified fall, initial encounter: Secondary | ICD-10-CM | POA: Diagnosis not present

## 2022-02-23 DIAGNOSIS — F01B3 Vascular dementia, moderate, with mood disturbance: Secondary | ICD-10-CM | POA: Diagnosis not present

## 2022-02-23 DIAGNOSIS — F331 Major depressive disorder, recurrent, moderate: Secondary | ICD-10-CM | POA: Diagnosis not present

## 2022-02-23 DIAGNOSIS — F02B3 Dementia in other diseases classified elsewhere, moderate, with mood disturbance: Secondary | ICD-10-CM | POA: Diagnosis not present

## 2022-02-23 DIAGNOSIS — G301 Alzheimer's disease with late onset: Secondary | ICD-10-CM | POA: Diagnosis not present

## 2022-02-24 DIAGNOSIS — Z79899 Other long term (current) drug therapy: Secondary | ICD-10-CM | POA: Diagnosis not present

## 2022-02-25 DIAGNOSIS — F331 Major depressive disorder, recurrent, moderate: Secondary | ICD-10-CM | POA: Diagnosis not present

## 2022-02-25 DIAGNOSIS — F03B11 Unspecified dementia, moderate, with agitation: Secondary | ICD-10-CM | POA: Diagnosis not present

## 2022-02-25 DIAGNOSIS — Z7689 Persons encountering health services in other specified circumstances: Secondary | ICD-10-CM | POA: Diagnosis not present

## 2022-02-27 DIAGNOSIS — D649 Anemia, unspecified: Secondary | ICD-10-CM | POA: Diagnosis not present

## 2022-02-27 DIAGNOSIS — R634 Abnormal weight loss: Secondary | ICD-10-CM | POA: Diagnosis not present

## 2022-02-27 DIAGNOSIS — F03B11 Unspecified dementia, moderate, with agitation: Secondary | ICD-10-CM | POA: Diagnosis not present

## 2022-03-02 DIAGNOSIS — E569 Vitamin deficiency, unspecified: Secondary | ICD-10-CM | POA: Diagnosis not present

## 2022-03-02 DIAGNOSIS — D649 Anemia, unspecified: Secondary | ICD-10-CM | POA: Diagnosis not present

## 2022-03-05 DIAGNOSIS — R062 Wheezing: Secondary | ICD-10-CM | POA: Diagnosis not present

## 2022-03-05 DIAGNOSIS — F03911 Unspecified dementia, unspecified severity, with agitation: Secondary | ICD-10-CM | POA: Diagnosis not present

## 2022-03-05 DIAGNOSIS — R051 Acute cough: Secondary | ICD-10-CM | POA: Diagnosis not present

## 2022-03-09 DIAGNOSIS — F331 Major depressive disorder, recurrent, moderate: Secondary | ICD-10-CM | POA: Diagnosis not present

## 2022-03-13 DIAGNOSIS — M6281 Muscle weakness (generalized): Secondary | ICD-10-CM | POA: Diagnosis not present

## 2022-03-13 DIAGNOSIS — R2689 Other abnormalities of gait and mobility: Secondary | ICD-10-CM | POA: Diagnosis not present

## 2022-03-13 DIAGNOSIS — F03911 Unspecified dementia, unspecified severity, with agitation: Secondary | ICD-10-CM | POA: Diagnosis not present

## 2022-03-13 DIAGNOSIS — Z9181 History of falling: Secondary | ICD-10-CM | POA: Diagnosis not present

## 2022-03-13 DIAGNOSIS — W19XXXA Unspecified fall, initial encounter: Secondary | ICD-10-CM | POA: Diagnosis not present

## 2022-03-20 DIAGNOSIS — F03911 Unspecified dementia, unspecified severity, with agitation: Secondary | ICD-10-CM | POA: Diagnosis not present

## 2022-03-20 DIAGNOSIS — H1032 Unspecified acute conjunctivitis, left eye: Secondary | ICD-10-CM | POA: Diagnosis not present

## 2022-03-23 DIAGNOSIS — F331 Major depressive disorder, recurrent, moderate: Secondary | ICD-10-CM | POA: Diagnosis not present

## 2022-03-23 DIAGNOSIS — F02B3 Dementia in other diseases classified elsewhere, moderate, with mood disturbance: Secondary | ICD-10-CM | POA: Diagnosis not present

## 2022-03-23 DIAGNOSIS — G301 Alzheimer's disease with late onset: Secondary | ICD-10-CM | POA: Diagnosis not present

## 2022-03-23 DIAGNOSIS — F01B3 Vascular dementia, moderate, with mood disturbance: Secondary | ICD-10-CM | POA: Diagnosis not present

## 2022-03-30 DIAGNOSIS — F03911 Unspecified dementia, unspecified severity, with agitation: Secondary | ICD-10-CM | POA: Diagnosis not present

## 2022-03-30 DIAGNOSIS — H1032 Unspecified acute conjunctivitis, left eye: Secondary | ICD-10-CM | POA: Diagnosis not present

## 2022-04-17 DIAGNOSIS — S51812A Laceration without foreign body of left forearm, initial encounter: Secondary | ICD-10-CM | POA: Diagnosis not present

## 2022-04-17 DIAGNOSIS — S61512A Laceration without foreign body of left wrist, initial encounter: Secondary | ICD-10-CM | POA: Diagnosis not present

## 2022-04-17 DIAGNOSIS — F03911 Unspecified dementia, unspecified severity, with agitation: Secondary | ICD-10-CM | POA: Diagnosis not present

## 2022-04-20 DIAGNOSIS — F01B3 Vascular dementia, moderate, with mood disturbance: Secondary | ICD-10-CM | POA: Diagnosis not present

## 2022-04-20 DIAGNOSIS — F02B3 Dementia in other diseases classified elsewhere, moderate, with mood disturbance: Secondary | ICD-10-CM | POA: Diagnosis not present

## 2022-04-20 DIAGNOSIS — G301 Alzheimer's disease with late onset: Secondary | ICD-10-CM | POA: Diagnosis not present

## 2022-04-20 DIAGNOSIS — F331 Major depressive disorder, recurrent, moderate: Secondary | ICD-10-CM | POA: Diagnosis not present

## 2022-04-21 DIAGNOSIS — E569 Vitamin deficiency, unspecified: Secondary | ICD-10-CM | POA: Diagnosis not present

## 2022-04-21 DIAGNOSIS — C4490 Unspecified malignant neoplasm of skin, unspecified: Secondary | ICD-10-CM | POA: Diagnosis not present

## 2022-04-21 DIAGNOSIS — D649 Anemia, unspecified: Secondary | ICD-10-CM | POA: Diagnosis not present

## 2022-04-21 DIAGNOSIS — S61412A Laceration without foreign body of left hand, initial encounter: Secondary | ICD-10-CM | POA: Diagnosis not present

## 2022-04-22 DIAGNOSIS — F03911 Unspecified dementia, unspecified severity, with agitation: Secondary | ICD-10-CM | POA: Diagnosis not present

## 2022-04-22 DIAGNOSIS — Z87440 Personal history of urinary (tract) infections: Secondary | ICD-10-CM | POA: Diagnosis not present

## 2022-04-22 DIAGNOSIS — R3981 Functional urinary incontinence: Secondary | ICD-10-CM | POA: Diagnosis not present

## 2022-04-29 DIAGNOSIS — D649 Anemia, unspecified: Secondary | ICD-10-CM | POA: Diagnosis not present

## 2022-04-29 DIAGNOSIS — S61412D Laceration without foreign body of left hand, subsequent encounter: Secondary | ICD-10-CM | POA: Diagnosis not present

## 2022-04-29 DIAGNOSIS — C4490 Unspecified malignant neoplasm of skin, unspecified: Secondary | ICD-10-CM | POA: Diagnosis not present

## 2022-04-29 DIAGNOSIS — E569 Vitamin deficiency, unspecified: Secondary | ICD-10-CM | POA: Diagnosis not present

## 2022-05-06 DIAGNOSIS — D649 Anemia, unspecified: Secondary | ICD-10-CM | POA: Diagnosis not present

## 2022-05-06 DIAGNOSIS — C4490 Unspecified malignant neoplasm of skin, unspecified: Secondary | ICD-10-CM | POA: Diagnosis not present

## 2022-05-06 DIAGNOSIS — S51812D Laceration without foreign body of left forearm, subsequent encounter: Secondary | ICD-10-CM | POA: Diagnosis not present

## 2022-05-06 DIAGNOSIS — S61412D Laceration without foreign body of left hand, subsequent encounter: Secondary | ICD-10-CM | POA: Diagnosis not present

## 2022-05-18 DIAGNOSIS — F03911 Unspecified dementia, unspecified severity, with agitation: Secondary | ICD-10-CM | POA: Diagnosis not present

## 2022-05-18 DIAGNOSIS — N181 Chronic kidney disease, stage 1: Secondary | ICD-10-CM | POA: Diagnosis not present

## 2022-05-18 DIAGNOSIS — U071 COVID-19: Secondary | ICD-10-CM | POA: Diagnosis not present

## 2022-05-18 DIAGNOSIS — R051 Acute cough: Secondary | ICD-10-CM | POA: Diagnosis not present

## 2022-05-18 DIAGNOSIS — S60512D Abrasion of left hand, subsequent encounter: Secondary | ICD-10-CM | POA: Diagnosis not present

## 2022-05-20 DIAGNOSIS — C4490 Unspecified malignant neoplasm of skin, unspecified: Secondary | ICD-10-CM | POA: Diagnosis not present

## 2022-05-20 DIAGNOSIS — E569 Vitamin deficiency, unspecified: Secondary | ICD-10-CM | POA: Diagnosis not present

## 2022-05-20 DIAGNOSIS — S61412D Laceration without foreign body of left hand, subsequent encounter: Secondary | ICD-10-CM | POA: Diagnosis not present

## 2022-05-20 DIAGNOSIS — F03911 Unspecified dementia, unspecified severity, with agitation: Secondary | ICD-10-CM | POA: Diagnosis not present

## 2022-05-20 DIAGNOSIS — S60512D Abrasion of left hand, subsequent encounter: Secondary | ICD-10-CM | POA: Diagnosis not present

## 2022-05-20 DIAGNOSIS — D649 Anemia, unspecified: Secondary | ICD-10-CM | POA: Diagnosis not present

## 2022-05-20 DIAGNOSIS — U071 COVID-19: Secondary | ICD-10-CM | POA: Diagnosis not present

## 2022-05-22 ENCOUNTER — Emergency Department (HOSPITAL_COMMUNITY): Payer: Medicare PPO

## 2022-05-22 ENCOUNTER — Other Ambulatory Visit: Payer: Self-pay

## 2022-05-22 ENCOUNTER — Emergency Department (HOSPITAL_COMMUNITY)
Admission: EM | Admit: 2022-05-22 | Discharge: 2022-05-22 | Disposition: A | Discharge status: Skilled Nursing Facility | Payer: Medicare PPO | Attending: Emergency Medicine | Admitting: Emergency Medicine

## 2022-05-22 DIAGNOSIS — F0390 Unspecified dementia without behavioral disturbance: Secondary | ICD-10-CM | POA: Diagnosis not present

## 2022-05-22 DIAGNOSIS — U071 COVID-19: Secondary | ICD-10-CM | POA: Insufficient documentation

## 2022-05-22 DIAGNOSIS — M85842 Other specified disorders of bone density and structure, left hand: Secondary | ICD-10-CM | POA: Diagnosis not present

## 2022-05-22 DIAGNOSIS — Z7401 Bed confinement status: Secondary | ICD-10-CM | POA: Diagnosis not present

## 2022-05-22 DIAGNOSIS — F0280 Dementia in other diseases classified elsewhere without behavioral disturbance: Secondary | ICD-10-CM | POA: Diagnosis present

## 2022-05-22 DIAGNOSIS — S51812A Laceration without foreign body of left forearm, initial encounter: Secondary | ICD-10-CM | POA: Insufficient documentation

## 2022-05-22 DIAGNOSIS — S61412A Laceration without foreign body of left hand, initial encounter: Secondary | ICD-10-CM | POA: Diagnosis not present

## 2022-05-22 DIAGNOSIS — Z23 Encounter for immunization: Secondary | ICD-10-CM | POA: Diagnosis not present

## 2022-05-22 DIAGNOSIS — R059 Cough, unspecified: Secondary | ICD-10-CM | POA: Diagnosis not present

## 2022-05-22 DIAGNOSIS — Y92129 Unspecified place in nursing home as the place of occurrence of the external cause: Secondary | ICD-10-CM | POA: Insufficient documentation

## 2022-05-22 DIAGNOSIS — S59912A Unspecified injury of left forearm, initial encounter: Secondary | ICD-10-CM | POA: Diagnosis present

## 2022-05-22 DIAGNOSIS — S60512A Abrasion of left hand, initial encounter: Secondary | ICD-10-CM | POA: Insufficient documentation

## 2022-05-22 DIAGNOSIS — R451 Restlessness and agitation: Secondary | ICD-10-CM | POA: Diagnosis not present

## 2022-05-22 DIAGNOSIS — M19042 Primary osteoarthritis, left hand: Secondary | ICD-10-CM | POA: Diagnosis not present

## 2022-05-22 DIAGNOSIS — X838XXA Intentional self-harm by other specified means, initial encounter: Secondary | ICD-10-CM | POA: Insufficient documentation

## 2022-05-22 DIAGNOSIS — F03911 Unspecified dementia, unspecified severity, with agitation: Secondary | ICD-10-CM | POA: Diagnosis not present

## 2022-05-22 DIAGNOSIS — R531 Weakness: Secondary | ICD-10-CM | POA: Diagnosis not present

## 2022-05-22 LAB — RESP PANEL BY RT-PCR (FLU A&B, COVID) ARPGX2
Influenza A by PCR: NEGATIVE
Influenza B by PCR: NEGATIVE
SARS Coronavirus 2 by RT PCR: POSITIVE — AB

## 2022-05-22 LAB — URINALYSIS, ROUTINE W REFLEX MICROSCOPIC
Glucose, UA: NEGATIVE mg/dL
Hgb urine dipstick: NEGATIVE
Ketones, ur: NEGATIVE mg/dL
Leukocytes,Ua: NEGATIVE
Nitrite: POSITIVE — AB
Protein, ur: 30 mg/dL — AB
Specific Gravity, Urine: 1.015 (ref 1.005–1.030)
pH: 7.5 (ref 5.0–8.0)

## 2022-05-22 LAB — URINALYSIS, MICROSCOPIC (REFLEX)

## 2022-05-22 LAB — CBC WITH DIFFERENTIAL/PLATELET
Abs Immature Granulocytes: 0.08 10*3/uL — ABNORMAL HIGH (ref 0.00–0.07)
Basophils Absolute: 0 10*3/uL (ref 0.0–0.1)
Basophils Relative: 0 %
Eosinophils Absolute: 0 10*3/uL (ref 0.0–0.5)
Eosinophils Relative: 0 %
HCT: 37.1 % (ref 36.0–46.0)
Hemoglobin: 12.2 g/dL (ref 12.0–15.0)
Immature Granulocytes: 1 %
Lymphocytes Relative: 19 %
Lymphs Abs: 1.7 10*3/uL (ref 0.7–4.0)
MCH: 33.3 pg (ref 26.0–34.0)
MCHC: 32.9 g/dL (ref 30.0–36.0)
MCV: 101.4 fL — ABNORMAL HIGH (ref 80.0–100.0)
Monocytes Absolute: 0.8 10*3/uL (ref 0.1–1.0)
Monocytes Relative: 9 %
Neutro Abs: 6.3 10*3/uL (ref 1.7–7.7)
Neutrophils Relative %: 71 %
Platelets: 309 10*3/uL (ref 150–400)
RBC: 3.66 MIL/uL — ABNORMAL LOW (ref 3.87–5.11)
RDW: 16 % — ABNORMAL HIGH (ref 11.5–15.5)
WBC: 8.9 10*3/uL (ref 4.0–10.5)
nRBC: 0 % (ref 0.0–0.2)

## 2022-05-22 LAB — BASIC METABOLIC PANEL
Anion gap: 8 (ref 5–15)
BUN: 23 mg/dL (ref 8–23)
CO2: 27 mmol/L (ref 22–32)
Calcium: 9.2 mg/dL (ref 8.9–10.3)
Chloride: 102 mmol/L (ref 98–111)
Creatinine, Ser: 0.41 mg/dL — ABNORMAL LOW (ref 0.44–1.00)
GFR, Estimated: >60 mL/min (ref >60)
Glucose, Bld: 105 mg/dL — ABNORMAL HIGH (ref 70–99)
Potassium: 2.8 mmol/L — ABNORMAL LOW (ref 3.5–5.1)
Sodium: 137 mmol/L (ref 135–145)

## 2022-05-22 MED ORDER — TETANUS-DIPHTH-ACELL PERTUSSIS 5-2.5-18.5 LF-MCG/0.5 IM SUSY
0.5000 mL | PREFILLED_SYRINGE | Freq: Once | INTRAMUSCULAR | Status: AC
  Administered 2022-05-22: 0.5 mL via INTRAMUSCULAR
  Filled 2022-05-22: qty 0.5

## 2022-05-22 MED ORDER — OLANZAPINE 5 MG PO TBDP: 2.5000 mg | ORAL_TABLET | Freq: Three times a day (TID) | ORAL | 0 refills | Status: AC | PRN

## 2022-05-22 MED ORDER — OLANZAPINE 5 MG PO TBDP
2.5000 mg | ORAL_TABLET | Freq: Three times a day (TID) | ORAL | Status: DC | PRN
  Administered 2022-05-22: 2.5 mg via ORAL
  Filled 2022-05-22: qty 1

## 2022-05-22 MED ORDER — DIVALPROEX SODIUM 125 MG PO CSDR: 250.0000 mg | DELAYED_RELEASE_CAPSULE | Freq: Two times a day (BID) | ORAL | 0 refills | Status: AC

## 2022-05-22 MED ORDER — POTASSIUM CHLORIDE 20 MEQ PO PACK
40.0000 meq | PACK | Freq: Every day | ORAL | Status: DC
  Administered 2022-05-22: 40 meq via ORAL
  Filled 2022-05-22: qty 2

## 2022-05-22 MED ORDER — BACITRACIN ZINC 500 UNIT/GM EX OINT
TOPICAL_OINTMENT | Freq: Every day | CUTANEOUS | Status: DC
  Administered 2022-05-22: 1 via TOPICAL

## 2022-05-22 MED ORDER — BACITRACIN ZINC 500 UNIT/GM EX OINT
TOPICAL_OINTMENT | CUTANEOUS | Status: AC
  Filled 2022-05-22: qty 1.8

## 2022-05-22 MED ORDER — DIVALPROEX SODIUM 125 MG PO CSDR: 250.0000 mg | DELAYED_RELEASE_CAPSULE | Freq: Two times a day (BID) | ORAL | Status: DC

## 2022-05-22 NOTE — ED Notes (Signed)
Called [facility] to request pt transport. Pending discharge

## 2022-05-22 NOTE — ED Notes (Signed)
RN found that pt had removed wound dressing and was bleeding from the back of her left hand. Cleaned wound and surrounding area; pt denies pain at site. Applied bacitracin per MD request and dressed with nonadherent dressing, island dressing, rolled gauze, and coban until entire affected area was thoroughly covered. Placed pt in safety mittens to limit ability to remove dressing.

## 2022-05-22 NOTE — ED Notes (Signed)
Called [facility] to report and request facility pickup; per nurse Webb Silversmith, they do not have staff available to get pt and we will need to call [facility] for discharge transport.

## 2022-05-22 NOTE — ED Notes (Signed)
Called facility at [phone] per note on paperwork; no answer. Called again at [phone] (main number) and was instructed to call [phone] instead. Attempted to give report and request transport back to facility but the call was not picked up after being transferred by receptionist. Will attempt again.

## 2022-05-22 NOTE — ED Notes (Signed)
Attempted to contact [facility] x 3 with no answer. Unable to obtain additional information regarding pt's arm injury. MD notified.

## 2022-05-22 NOTE — ED Triage Notes (Signed)
Pt via EMD from [facility] where staff reports pt has been increasingly agitated and scratching at her hands and arms with lac to one of her forearms. Pt has hx of dementia and is currently alert and oriented x 1. Pt was also diagnosed COVID+ on Monday 05/18/22. Facility has requested a psych eval in order to change her medications.
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
## 2022-05-22 NOTE — ED Provider Notes (Signed)
[place] DEPT Provider Note   CSN: [id number] Arrival date & time: 05/22/22  8144     History  Chief Complaint  Patient presents with   Agitation    [name] is a 81 y.o. female.  81 year old female with prior medical detail below presents for evaluation.  Patient arrives from [facility] facility.  She is transported by EMS.  EMS reports the patient has been increasingly agitated.  She apparently has been scratching at her left hand and arm.  She has several superficial abrasions and a self inflicted skin care to the dorsum of the left hand.  Patient apparently was also COVID-positive on Monday, November 13.  Patient is calm and cooperative.  She has no information as to why she is here today.  She is without specific complaint.  Contact attempted with staff at [facility].  No answer from staff there.  Contact attempted with patient's son.  Message left.  No callback received.  Received a call from [place] NP at [facility]. Patient with history of dementia, anxiety.  Cyril Mourning, NP - personal phone # [phone] - advised that patient is exhibiting increasing agitation and chronic picking at wounds of left forearm.  Patient with nonhealing wounds of the left forearm after continued picking and scratching at the forearm.  Cyril Mourning reports the patient is curriently receiving Depakote, Seroquel without improvement in agitation and self harming behaviors.  Requesting evaluation for possible [facility] psych placement.   The history is provided by the patient, medical records and the EMS personnel.       Home Medications Prior to Admission medications   Medication Sig Start Date End Date Taking? Authorizing Provider  ascorbic acid (VITAMIN C) 1000 MG tablet Take 1 tablet (1,000 mg total) by mouth daily. 08/07/21   Bonnielee Haff, MD  carvedilol (COREG) 3.125 MG tablet Take 1 tablet (3.125 mg total) by mouth 2 (two) times daily with a  meal. 08/07/21   Bonnielee Haff, MD  cholecalciferol (VITAMIN D) 25 MCG tablet Take 2 tablets (2,000 Units total) by mouth 2 (two) times daily. 08/07/21   Bonnielee Haff, MD  docusate sodium (COLACE) 100 MG capsule Take 1 capsule (100 mg total) by mouth 2 (two) times daily. 08/07/21   Bonnielee Haff, MD  gabapentin (NEURONTIN) 100 MG capsule Take 2 capsules (200 mg total) by mouth 3 (three) times daily. 08/07/21   Bonnielee Haff, MD  HYDROcodone-acetaminophen (NORCO/VICODIN) 5-325 MG tablet Take 1-2 tablets by mouth every 4 (four) hours as needed for moderate pain or severe pain. 08/07/21   Bonnielee Haff, MD  levocetirizine (XYZAL) 5 MG tablet Take 5 mg by mouth daily as needed for allergies.    [provider]  pantoprazole (PROTONIX) 40 MG tablet Take 1 tablet (40 mg total) by mouth 2 (two) times daily before a meal. 08/07/21   Bonnielee Haff, MD  polyethylene glycol (MIRALAX / GLYCOLAX) 17 g packet Take 17 g by mouth daily as needed for mild constipation. 08/07/21   Bonnielee Haff, MD  senna (SENOKOT) 8.6 MG TABS tablet Take 1 tablet (8.6 mg total) by mouth 2 (two) times daily. 08/07/21   Bonnielee Haff, MD  zinc sulfate 220 (50 Zn) MG capsule Take 1 capsule (220 mg total) by mouth daily. 08/07/21   Bonnielee Haff, MD      Allergies    Molds & smuts, Other, and Statins    Review of Systems   Review of Systems  All other systems reviewed and are negative.  Physical Exam Updated Vital Signs BP (!) 156/115   Pulse 71   Temp 97.7 F (36.5 C) (Oral)   Resp 18   Ht 5' (1.524 m)   Wt 72.6 kg   SpO2 97%   BMI 31.25 kg/m  Physical Exam Vitals and nursing note reviewed.  Constitutional:      General: She is not in acute distress.    Appearance: Normal appearance. She is well-developed.  HENT:     Head: Normocephalic and atraumatic.  Eyes:     Conjunctiva/sclera: Conjunctivae normal.     Pupils: Pupils are equal, round, and reactive to light.  Cardiovascular:     Rate and Rhythm:  Normal rate and regular rhythm.     Heart sounds: Normal heart sounds.  Pulmonary:     Effort: Pulmonary effort is normal. No respiratory distress.     Breath sounds: Normal breath sounds.  Abdominal:     General: There is no distension.     Palpations: Abdomen is soft.     Tenderness: There is no abdominal tenderness.  Musculoskeletal:        General: No deformity. Normal range of motion.     Cervical back: Normal range of motion and neck supple.  Skin:    General: Skin is warm and dry.     Comments: Multiple abrasions of various ages to the left forearm, left hand.   Deeper abrasion/skin tear to the dorsum of the left forearm.  This is reportedly an acute on chronic wound.  No drainage noted.  Neurological:     General: No focal deficit present.     Mental Status: She is alert and oriented to person, place, and time.     ED Results / Procedures / Treatments   Labs (all labs ordered are listed, but only abnormal results are displayed) Labs Reviewed  CBC WITH DIFFERENTIAL/PLATELET - Abnormal; Notable for the following components:      Result Value   RBC 3.66 (*)    MCV 101.4 (*)    RDW 16.0 (*)    Abs Immature Granulocytes 0.08 (*)    All other components within normal limits  RESP PANEL BY RT-PCR (FLU A&B, COVID) ARPGX2  BASIC METABOLIC PANEL  URINALYSIS, ROUTINE W REFLEX MICROSCOPIC    EKG None  Radiology DG Forearm Left  Result Date: 05/22/2022 CLINICAL DATA:  Forearm laceration.  Dementia. EXAM: LEFT FOREARM - 2 VIEW COMPARISON:  Hand radiographs same date. Forearm radiographs 07/27/2021. FINDINGS: The bones are diffusely demineralized. There is no evidence of acute fracture or dislocation. Soft tissue irregularity in the mid forearm may relate to the reported laceration. No evidence of foreign body or soft tissue emphysema. IMPRESSION: No acute osseous findings or evidence of foreign body. Electronically Signed   By: Richardean Sale M.D.   On: 05/22/2022 10:38    DG Chest Port 1 View  Result Date: 05/22/2022 CLINICAL DATA:  Cough with increasing agitation. History of dementia. EXAM: PORTABLE CHEST 1 VIEW COMPARISON:  Radiographs 07/27/2021 and 04/27/2021.  CT 04/27/2021. FINDINGS: 1003 hours. The heart size and mediastinal contours are stable with aortic atherosclerosis. The lungs are clear. There is no pleural effusion or pneumothorax. No acute osseous findings are evident post interval left humeral ORIF. Degenerative changes are present at both shoulders. IMPRESSION: No evidence of acute cardiopulmonary process. Electronically Signed   By: Richardean Sale M.D.   On: 05/22/2022 10:33   DG Hand Complete Left  Result Date: 05/22/2022 CLINICAL DATA:  Increased agitation  with scratching and lacerations. EXAM: LEFT HAND - COMPLETE 3+ VIEW COMPARISON:  None Available. FINDINGS: There is no evidence of fracture or dislocation. Pronounced osteopenia. Interphalangeal degenerative spurring, assessment of the digits is limited by finger flexion. No opaque foreign body or soft tissue gas when accounting for overlapping bandage. There may be coalition of the capitate and hamate IMPRESSION: No acute osseous finding. Degenerative spurring and pronounced osteopenia. Electronically Signed   By: Jorje Guild M.D.   On: 05/22/2022 10:33    Procedures Procedures    Medications Ordered in ED Medications  Tdap (BOOSTRIX) injection 0.5 mL (0.5 mLs Intramuscular Given 05/22/22 1047)    ED Course/ Medical Decision Making/ A&P                           Medical Decision Making Amount and/or Complexity of Data Reviewed Labs: ordered. Radiology: ordered.  Risk Prescription drug management.    Medical Screen Complete  This patient presented to the ED with complaint of agitation, dementia, self-injurious behavior.  This complaint involves an extensive number of treatment options. The initial differential diagnosis includes, but is not limited to, increasing  agitation, dementia  This presentation is: Acute, Chronic, Self-Limited, Previously Undiagnosed, Uncertain Prognosis, Complicated, Systemic Symptoms, and Threat to Life/Bodily Function  Patient with history of dementia anxiety presents with apparent increasing agitation and self harming behavior.  Patient is chronically picking at and scratching at her left forearm.  Patient with multiple chronic abrasions and wounds of the left forearm.  Patient is without complaint on evaluation in the ED.  Additional history obtained from [place] NP [phone] -provided additional history.  Patient with gradually worsening agitation and self harming behaviors.  Patient without appropriate control despite use of Depakote and Seroquel.  Cyril Mourning is asking for [facility] psych evaluation and possible evaluation for [facility] psych placement away from [facility].  Notably patient with recent diagnosis of COVID on November 12.  She did complete a course of oral antivirals.  Patient's wounds to the left forearm are chronic in nature.  No indication for suturing or repair.  She is medically clear for psychiatric evaluation.  Final plan of care depend upon psychiatric assessment.  Additional history obtained:  Additional history obtained from Caregiver External records from outside sources obtained and reviewed including prior ED visits and prior Inpatient records.    Lab Tests:  I ordered and personally interpreted labs.  The pertinent results include: CBC, BMP, UA   Imaging Studies ordered:  I ordered imaging studies including plain films of left forearm, chest, left hand I independently visualized and interpreted obtained imaging which showed NAD I agree with the radiologist interpretation.   Cardiac Monitoring:  The patient was maintained on a cardiac monitor.  I personally viewed and interpreted the cardiac monitor which showed an underlying rhythm of: NSR   Problem List / ED  Course:  Dementia   Reevaluation:  After the interventions noted above, I reevaluated the patient and found that they have: stayed the same   Disposition:  After consideration of the diagnostic results and the patients response to treatment, I feel that the patent would benefit from psych evaluation for possible geri psych placement.          Final Clinical Impression(s) / ED Diagnoses Final diagnoses:  Dementia with agitation, unspecified dementia severity, unspecified dementia type ([place])    Rx / DC Orders ED Discharge Orders     None  Valarie Merino, MD 05/31/22 [phone]

## 2022-05-22 NOTE — Discharge Instructions (Addendum)
Return for any problem.  Use Depakote and Zyprexa as prescribed for agitation.

## 2022-05-25 DIAGNOSIS — I69998 Other sequelae following unspecified cerebrovascular disease: Secondary | ICD-10-CM | POA: Diagnosis not present

## 2022-05-25 DIAGNOSIS — F01B2 Vascular dementia, moderate, with psychotic disturbance: Secondary | ICD-10-CM | POA: Diagnosis not present

## 2022-05-27 ENCOUNTER — Other Ambulatory Visit: Payer: Self-pay

## 2022-05-27 ENCOUNTER — Emergency Department (HOSPITAL_COMMUNITY): Payer: Medicare PPO

## 2022-05-27 ENCOUNTER — Encounter (HOSPITAL_COMMUNITY): Payer: Self-pay

## 2022-05-27 ENCOUNTER — Emergency Department (HOSPITAL_COMMUNITY)
Admission: EM | Admit: 2022-05-27 | Discharge: 2022-05-27 | Disposition: A | Discharge status: Home / Self Care | Payer: Medicare PPO | Attending: Emergency Medicine | Admitting: Emergency Medicine

## 2022-05-27 DIAGNOSIS — I10 Essential (primary) hypertension: Secondary | ICD-10-CM | POA: Insufficient documentation

## 2022-05-27 DIAGNOSIS — M7989 Other specified soft tissue disorders: Secondary | ICD-10-CM | POA: Diagnosis not present

## 2022-05-27 DIAGNOSIS — Z7401 Bed confinement status: Secondary | ICD-10-CM | POA: Diagnosis not present

## 2022-05-27 DIAGNOSIS — E569 Vitamin deficiency, unspecified: Secondary | ICD-10-CM | POA: Diagnosis not present

## 2022-05-27 DIAGNOSIS — F29 Unspecified psychosis not due to a substance or known physiological condition: Secondary | ICD-10-CM | POA: Diagnosis not present

## 2022-05-27 DIAGNOSIS — S61412D Laceration without foreign body of left hand, subsequent encounter: Secondary | ICD-10-CM | POA: Diagnosis not present

## 2022-05-27 DIAGNOSIS — L03114 Cellulitis of left upper limb: Secondary | ICD-10-CM | POA: Insufficient documentation

## 2022-05-27 DIAGNOSIS — Z79899 Other long term (current) drug therapy: Secondary | ICD-10-CM | POA: Insufficient documentation

## 2022-05-27 DIAGNOSIS — F0390 Unspecified dementia without behavioral disturbance: Secondary | ICD-10-CM | POA: Diagnosis not present

## 2022-05-27 DIAGNOSIS — Z5189 Encounter for other specified aftercare: Secondary | ICD-10-CM

## 2022-05-27 DIAGNOSIS — Z4800 Encounter for change or removal of nonsurgical wound dressing: Secondary | ICD-10-CM | POA: Diagnosis not present

## 2022-05-27 DIAGNOSIS — Z4801 Encounter for change or removal of surgical wound dressing: Secondary | ICD-10-CM | POA: Diagnosis not present

## 2022-05-27 DIAGNOSIS — C4490 Unspecified malignant neoplasm of skin, unspecified: Secondary | ICD-10-CM | POA: Diagnosis not present

## 2022-05-27 DIAGNOSIS — D649 Anemia, unspecified: Secondary | ICD-10-CM | POA: Diagnosis not present

## 2022-05-27 DIAGNOSIS — R531 Weakness: Secondary | ICD-10-CM | POA: Diagnosis not present

## 2022-05-27 DIAGNOSIS — S6992XA Unspecified injury of left wrist, hand and finger(s), initial encounter: Secondary | ICD-10-CM | POA: Diagnosis not present

## 2022-05-27 LAB — CBC WITH DIFFERENTIAL/PLATELET
Abs Immature Granulocytes: 0.25 10*3/uL — ABNORMAL HIGH (ref 0.00–0.07)
Basophils Absolute: 0.1 10*3/uL (ref 0.0–0.1)
Basophils Relative: 1 %
Eosinophils Absolute: 0.1 10*3/uL (ref 0.0–0.5)
Eosinophils Relative: 1 %
HCT: 36.7 % (ref 36.0–46.0)
Hemoglobin: 11.5 g/dL — ABNORMAL LOW (ref 12.0–15.0)
Immature Granulocytes: 3 %
Lymphocytes Relative: 18 %
Lymphs Abs: 1.7 10*3/uL (ref 0.7–4.0)
MCH: 32.7 pg (ref 26.0–34.0)
MCHC: 31.3 g/dL (ref 30.0–36.0)
MCV: 104.3 fL — ABNORMAL HIGH (ref 80.0–100.0)
Monocytes Absolute: 1.1 10*3/uL — ABNORMAL HIGH (ref 0.1–1.0)
Monocytes Relative: 12 %
Neutro Abs: 6.3 10*3/uL (ref 1.7–7.7)
Neutrophils Relative %: 65 %
Platelets: 361 10*3/uL (ref 150–400)
RBC: 3.52 MIL/uL — ABNORMAL LOW (ref 3.87–5.11)
RDW: 16.9 % — ABNORMAL HIGH (ref 11.5–15.5)
WBC: 9.6 10*3/uL (ref 4.0–10.5)
nRBC: 0.5 % — ABNORMAL HIGH (ref 0.0–0.2)

## 2022-05-27 LAB — URINALYSIS, ROUTINE W REFLEX MICROSCOPIC
Bacteria, UA: NONE SEEN
Bilirubin Urine: NEGATIVE
Glucose, UA: NEGATIVE mg/dL
Hgb urine dipstick: NEGATIVE
Ketones, ur: 5 mg/dL — AB
Leukocytes,Ua: NEGATIVE
Nitrite: NEGATIVE
Protein, ur: 30 mg/dL — AB
Specific Gravity, Urine: 1.033 — ABNORMAL HIGH (ref 1.005–1.030)
pH: 5 (ref 5.0–8.0)

## 2022-05-27 LAB — COMPREHENSIVE METABOLIC PANEL
ALT: 14 U/L (ref 0–44)
AST: 14 U/L — ABNORMAL LOW (ref 15–41)
Albumin: 3.2 g/dL — ABNORMAL LOW (ref 3.5–5.0)
Alkaline Phosphatase: 79 U/L (ref 38–126)
Anion gap: 9 (ref 5–15)
BUN: 27 mg/dL — ABNORMAL HIGH (ref 8–23)
CO2: 28 mmol/L (ref 22–32)
Calcium: 9.1 mg/dL (ref 8.9–10.3)
Chloride: 101 mmol/L (ref 98–111)
Creatinine, Ser: 0.54 mg/dL (ref 0.44–1.00)
GFR, Estimated: >60 mL/min (ref >60)
Glucose, Bld: 102 mg/dL — ABNORMAL HIGH (ref 70–99)
Potassium: 3.1 mmol/L — ABNORMAL LOW (ref 3.5–5.1)
Sodium: 138 mmol/L (ref 135–145)
Total Bilirubin: 0.4 mg/dL (ref 0.3–1.2)
Total Protein: 6.5 g/dL (ref 6.5–8.1)

## 2022-05-27 LAB — LACTIC ACID, PLASMA: Lactic Acid, Venous: 1.9 mmol/L (ref 0.5–1.9)

## 2022-05-27 LAB — C-REACTIVE PROTEIN: CRP: 11.6 mg/dL — ABNORMAL HIGH (ref <1.0)

## 2022-05-27 LAB — SEDIMENTATION RATE: Sed Rate: 36 mm/hr — ABNORMAL HIGH (ref 0–22)

## 2022-05-27 MED ORDER — VANCOMYCIN HCL 1500 MG/300ML IV SOLN
1500.0000 mg | Freq: Once | INTRAVENOUS | Status: AC
  Administered 2022-05-27: 1500 mg via INTRAVENOUS
  Filled 2022-05-27: qty 300

## 2022-05-27 MED ORDER — CEPHALEXIN 500 MG PO CAPS: 500.0000 mg | ORAL_CAPSULE | Freq: Four times a day (QID) | ORAL | 0 refills | Status: AC

## 2022-05-27 MED ORDER — LACTATED RINGERS IV BOLUS
1000.0000 mL | Freq: Once | INTRAVENOUS | Status: AC
  Administered 2022-05-27: 1000 mL via INTRAVENOUS

## 2022-05-27 MED ORDER — SODIUM CHLORIDE 0.9 % IV SOLN
1.0000 g | Freq: Once | INTRAVENOUS | Status: AC
  Administered 2022-05-27: 1 g via INTRAVENOUS
  Filled 2022-05-27: qty 10

## 2022-05-27 MED ORDER — POTASSIUM CHLORIDE CRYS ER 20 MEQ PO TBCR
40.0000 meq | EXTENDED_RELEASE_TABLET | Freq: Once | ORAL | Status: AC
  Administered 2022-05-27: 40 meq via ORAL
  Filled 2022-05-27: qty 2

## 2022-05-27 NOTE — ED Provider Notes (Signed)
[facility] DEPT Provider Note   CSN: [id number] Arrival date & time: 05/27/22  1325     History  Chief Complaint  Patient presents with   Wound Check    [name] is a 81 y.o. female.   Wound Check  Patient presents for wound.  She has a chronic wound on the dorsum of her left hand that she reportedly scratches.  She was seen in the emergency department 5 days ago.  Wound was well-appearing at that time.  No antibiotics were initiated at that time.  Wound was dressed and patient was psychiatrically cleared.  She was prescribed increased dose of Depakote and adding of olanzapine for agitation.  Since her recent discharge, she has been able to remove mittens and dressings that were placed on her wound.  She has continued to pick at her wound.  Her son reports that she underwent a left humerus injury in the past that caused nerve damage.  She has since had loss of pain sensation in her left arm from the elbow down.  Other medical history is notable for dementia, HTN, GERD, HLD, and arthritis.  She is accompanied in the ED today by her son who reports that she is at her mental baseline.  Patient currently denies any areas of pain.  Patient's son does feel that wound is worse in appearance than previously.  She has new swelling to the dorsum of her hand.    Home Medications Prior to Admission medications   Medication Sig Start Date End Date Taking? Authorizing Provider  cephALEXin (KEFLEX) 500 MG capsule Take 1 capsule (500 mg total) by mouth 4 (four) times daily for 14 days. 05/27/22 06/10/22 Yes Godfrey Pick, MD  acetaminophen (TYLENOL) 325 MG tablet Take 650 mg by mouth every 8 (eight) hours as needed for mild pain.    [provider]  ascorbic acid (VITAMIN C) 1000 MG tablet Take 1 tablet (1,000 mg total) by mouth daily. 08/07/21   Bonnielee Haff, MD  azithromycin (ZITHROMAX) 500 MG tablet Take by mouth daily. X 7 days 05/18/22   [provider]  carvedilol (COREG) 3.125 MG tablet Take 1 tablet (3.125 mg total) by mouth 2 (two) times daily with a meal. 08/07/21   Bonnielee Haff, MD  cholecalciferol (VITAMIN D) 25 MCG tablet Take 2 tablets (2,000 Units total) by mouth 2 (two) times daily. Patient taking differently: Take 1,000 Units by mouth See admin instructions. X 10 days ( started on 05-19-22) 08/07/21   Bonnielee Haff, MD  dexamethasone (DECADRON) 6 MG tablet Take 6 mg by mouth daily. X 5 days - started on 05-18-22 05/18/22   [provider]  divalproex (DEPAKOTE SPRINKLE) 125 MG capsule Take 2 capsules (250 mg total) by mouth every 12 (twelve) hours. 05/22/22 06/21/22  Delfin Gant, NP  divalproex (DEPAKOTE SPRINKLE) 125 MG capsule Take 125-250 mg by mouth See admin instructions. Taking 125 mg in the Am and 250 mg ( 2 caps) in the afternoon    [provider]  docusate sodium (COLACE) 100 MG capsule Take 1 capsule (100 mg total) by mouth 2 (two) times daily. 08/07/21   Bonnielee Haff, MD  Ensure (ENSURE) Take 237 mLs by mouth 3 (three) times daily between meals.    [provider]  gabapentin (NEURONTIN) 100 MG capsule Take 2 capsules (200 mg total) by mouth 3 (three) times daily. 08/07/21   Bonnielee Haff, MD  HYDROcodone-acetaminophen (NORCO/VICODIN) 5-325 MG tablet Take 1-2 tablets by  mouth every 4 (four) hours as needed for moderate pain or severe pain. 08/07/21   Bonnielee Haff, MD  LAGEVRIO 200 MG CAPS capsule Take 4 capsules by mouth 2 (two) times daily. X 5 days 05/18/22   [provider]  levocetirizine (XYZAL) 5 MG tablet Take 5 mg by mouth daily as needed for allergies.    [provider]  mupirocin ointment (BACTROBAN) 2 % Apply 1 Application topically 2 (two) times daily. To left hand for 14 days 05/18/22 05/31/22  [provider]  OLANZapine zydis (ZYPREXA) 5 MG disintegrating tablet Take 0.5 tablets (2.5 mg total) by mouth every 8 (eight) hours as needed  (severe agitation). 05/22/22 06/21/22  Delfin Gant, NP  pantoprazole (PROTONIX) 40 MG tablet Take 1 tablet (40 mg total) by mouth 2 (two) times daily before a meal. 08/07/21   Bonnielee Haff, MD  polyethylene glycol (MIRALAX / GLYCOLAX) 17 g packet Take 17 g by mouth daily as needed for mild constipation. 08/07/21   Bonnielee Haff, MD  PRESCRIPTION MEDICATION Apply 1 Application topically every 12 (twelve) hours as needed (for behavior related issues  ( apply to shoulder and neck)).    [provider]  senna (SENOKOT) 8.6 MG TABS tablet Take 1 tablet (8.6 mg total) by mouth 2 (two) times daily. 08/07/21   Bonnielee Haff, MD  sertraline (ZOLOFT) 50 MG tablet Take 50 mg by mouth daily.    [provider]  zinc gluconate 50 MG tablet Take 50 mg by mouth daily. X 10 days    [provider]      Allergies    Molds & smuts, Other, and Statins    Review of Systems   Review of Systems  Unable to perform ROS: Dementia  Skin:  Positive for wound.    Physical Exam Updated Vital Signs BP 131/65   Pulse 74   Temp 98.4 F (36.9 C) (Oral)   Resp 18   Ht 5' (1.524 m)   Wt 72 kg   SpO2 94%   BMI 31.00 kg/m  Physical Exam Vitals and nursing note reviewed.  Constitutional:      General: She is not in acute distress.    Appearance: Normal appearance. She is well-developed. She is not ill-appearing, toxic-appearing or diaphoretic.  HENT:     Head: Normocephalic and atraumatic.     Right Ear: External ear normal.     Left Ear: External ear normal.     Nose: Nose normal.     Mouth/Throat:     Mouth: Mucous membranes are moist.     Pharynx: Oropharynx is clear.  Eyes:     Extraocular Movements: Extraocular movements intact.     Conjunctiva/sclera: Conjunctivae normal.  Cardiovascular:     Rate and Rhythm: Normal rate and regular rhythm.  Pulmonary:     Effort: Pulmonary effort is normal. No respiratory distress.  Abdominal:     General: There is no  distension.     Palpations: Abdomen is soft.     Tenderness: There is no abdominal tenderness.  Musculoskeletal:        General: Swelling and signs of injury present.     Cervical back: Normal range of motion and neck supple.     Comments: Wound to dorsum of left hand with surrounding erythema and swelling.  Skin:    General: Skin is warm and dry.     Capillary Refill: Capillary refill takes less than 2 seconds.  Neurological:  General: No focal deficit present.     Mental Status: She is alert. Mental status is at baseline.     Cranial Nerves: No cranial nerve deficit.     Sensory: No sensory deficit.     Motor: No weakness.     Coordination: Coordination normal.  Psychiatric:        Mood and Affect: Mood normal.        Behavior: Behavior normal.     ED Results / Procedures / Treatments   Labs (all labs ordered are listed, but only abnormal results are displayed) Labs Reviewed  CBC WITH DIFFERENTIAL/PLATELET - Abnormal; Notable for the following components:      Result Value   RBC 3.52 (*)    Hemoglobin 11.5 (*)    MCV 104.3 (*)    RDW 16.9 (*)    nRBC 0.5 (*)    Monocytes Absolute 1.1 (*)    Abs Immature Granulocytes 0.25 (*)    All other components within normal limits  COMPREHENSIVE METABOLIC PANEL - Abnormal; Notable for the following components:   Potassium 3.1 (*)    Glucose, Bld 102 (*)    BUN 27 (*)    Albumin 3.2 (*)    AST 14 (*)    All other components within normal limits  SEDIMENTATION RATE - Abnormal; Notable for the following components:   Sed Rate 36 (*)    All other components within normal limits  URINE CULTURE  CULTURE, BLOOD (ROUTINE X 2)  CULTURE, BLOOD (ROUTINE X 2)  LACTIC ACID, PLASMA  URINALYSIS, ROUTINE W REFLEX MICROSCOPIC  C-REACTIVE PROTEIN    EKG None  Radiology DG Hand 2 View Left  Result Date: 05/27/2022 CLINICAL DATA:  Hand wound.  Dementia. EXAM: LEFT HAND - 2 VIEW COMPARISON:  Radiographs 05/22/2022. FINDINGS: Two  views are submitted, limited by positioning, flexion of the fingers and marked osteopenia. No evidence of acute fracture, dislocation or bone destruction. Pulse oximeter overlies the index finger. Mild intercarpal degenerative changes are noted. Probable collision between the capitate and hamate. Dorsal soft tissue swelling has progressed, without evidence of foreign body or soft tissue emphysema. IMPRESSION: 1. Progressive dorsal soft tissue swelling without evidence of foreign body or soft tissue emphysema. 2. No acute osseous findings. Electronically Signed   By: Richardean Sale M.D.   On: 05/27/2022 15:41    Procedures Procedures    Medications Ordered in ED Medications  potassium chloride SA (KLOR-CON M) CR tablet 40 mEq (has no administration in time range)  cefTRIAXone (ROCEPHIN) 1 g in sodium chloride 0.9 % 100 mL IVPB (0 g Intravenous Stopped 05/27/22 1716)  vancomycin (VANCOREADY) IVPB 1500 mg/300 mL (1,500 mg Intravenous New Bag/Given 05/27/22 1707)  lactated ringers bolus 1,000 mL (1,000 mLs Intravenous New Bag/Given 05/27/22 1838)    ED Course/ Medical Decision Making/ A&P                           Medical Decision Making Amount and/or Complexity of Data Reviewed Labs: ordered. Radiology: ordered.  Risk Prescription drug management.   This patient presents to the ED for concern of hand wound, this involves an extensive number of treatment options, and is a complaint that carries with it a high risk of complications and morbidity.  The differential diagnosis includes cellulitis, poor wound healing, deep space infection   Co morbidities that complicate the patient evaluation  dementia, HTN, GERD, HLD, and arthritis   Additional history obtained:  Additional history obtained from patient's son External records from outside source obtained and reviewed including EMR   Lab Tests:  I Ordered, and personally interpreted labs.  The pertinent results include: Normal  kidney function, hypokalemia with otherwise normal electrolytes, no leukocytosis, normal lactate, age-adjusted normal ESR   Imaging Studies ordered:  I ordered imaging studies including hand x-ray I independently visualized and interpreted imaging which showed no osseous involvement, no foreign bodies I agree with the radiologist interpretation   Cardiac Monitoring: / EKG:  The patient was maintained on a cardiac monitor.  I personally viewed and interpreted the cardiac monitored which showed an underlying rhythm of: Sinus rhythm   Consultations Obtained:  I requested consultation with the hand surgeon, Dr. Ala Bent,  and discussed lab and imaging findings as well as pertinent plan - they recommend: Wet-to-dry dressing, splint to prevent further damage, 3 times daily Dial soap and water soaks followed by dressing changes, antibiotics for 2 weeks, follow-up in office in 1 week   Problem List / ED Course / Critical interventions / Medication management  Patient is a 81 year old female who presents for worsening left hand wound.  She has dementia and does reside in a nursing facility.  She presents to the ED with her son today.  Patient son provides history.  He reports that, due to remote humeral injury, patient has lost sensation in her left arm from the elbow down.  She developed a wound on her left hand which she has been persistently picking at.  Nursing facility has had concerns of psychiatric illness.  She was evaluated in the ED 5 days ago and psychiatrically cleared.  Per chart review, she was also found to have COVID-19, which her son states she has not had any symptoms from.  On urinalysis from 5 days ago, nitrites were positive as well.  Infections may have been contributing to her agitation and increased picking at her wound.  Wound is deep and there is exposed tendon.  There is surrounding erythema and swelling.  Findings are consistent with cellulitis.  Antibiotics were given in  the ED.  Laboratory workup was initiated.  I spoke with hand surgeon on-call who recommended approximation of wound to cover tendon if possible.  I attempted this at bedside but was unable to approximate the wound at all.  Alternative plan, discussed with hand surgeon, is for wet-to-dry dressings, 3 times daily Dial soap and water soaks followed by dressing changes, antibiotics for 2 weeks, and 1 week follow-up in his office.  Patient's son was informed of this.  While in the ED, patient remained calm and cooperative.  She was psychiatrically cleared for days ago here in the ED.  Symptoms described are not consistent with psychiatric illness, but rather dementia with behavioral disturbance.  I do not feel that she needs any psychiatric evaluation at this time.  Lab work shows hypokalemia and replacement potassium was ordered.  Wet-to-dry dressing was placed and splint was applied.  Patient's son was given instructions on wound care to pass on to facility.  At time of discharge, urine studies were pending.  Patient will be receiving antibiotics for cellulitis which will also cover urine infection.  She is stable for discharge at this time. I ordered medication including IV fluids for duration; potassium chloride for hypokalemia; antibiotics for cellulitis Reevaluation of the patient after these medicines showed that the patient improved I have reviewed the patients home medicines and have made adjustments as needed   Social Determinants of  Health:  Has dementia and resides at nursing facility         Final Clinical Impression(s) / ED Diagnoses Final diagnoses:  Visit for wound check  Cellulitis of left upper extremity    Rx / DC Orders ED Discharge Orders          Ordered    cephALEXin (KEFLEX) 500 MG capsule  4 times daily        05/27/22 1910              Godfrey Pick, MD 05/27/22 1911

## 2022-05-27 NOTE — ED Triage Notes (Signed)
The patient is being sent by the facility for self harm, she has a hx of dementia and has been scratching at a wound on her hand. The facility wants he IVC as this is the second time that she has gotten her dressing off and scratched at the wound until the tendon is showing. EMS states the patient is AxO x2 and this is her baseline.

## 2022-05-27 NOTE — Discharge Instructions (Signed)
Splint should be removed and wound should be soaked in antibiotic soap and water 3 times per day.  After each soak, wet-to-dry dressing should be applied and splint should be placed back on.  There is a prescription attached for antibiotics.  Take as prescribed.  There is a telephone number below to schedule a follow-up visit with hand surgeon, Dr. Ala Bent.  Call this number tomorrow or the next day to set up a 1 week follow-up appointment.  Return to the emergency department for any new or worsening symptoms of concern.

## 2022-05-29 LAB — URINE CULTURE: Culture: 30000 — AB

## 2022-05-30 ENCOUNTER — Telehealth (HOSPITAL_BASED_OUTPATIENT_CLINIC_OR_DEPARTMENT_OTHER): Payer: Self-pay | Admitting: *Deleted

## 2022-05-30 NOTE — Telephone Encounter (Signed)
Post ED Visit - Positive Culture Follow-up  Culture report reviewed by antimicrobial stewardship pharmacist: [place] Team '[]'$  Elenor Quinones, Pharm.D. '[]'$  Heide Guile, Pharm.D., BCPS AQ-ID '[]'$  Parks Neptune, Pharm.D., BCPS '[]'$  Alycia Rossetti, Pharm.D., BCPS '[]'$  [place], [place].D., BCPS, AAHIVP '[]'$  Legrand Como, Pharm.D., BCPS, AAHIVP '[]'$  Salome Arnt, PharmD, BCPS '[]'$  Johnnette Gourd, PharmD, BCPS '[]'$  Hughes Better, PharmD, BCPS '[]'$  Leeroy Cha, PharmD '[]'$  Laqueta Linden, PharmD, BCPS '[]'$  Albertina Parr, PharmD  [place] Team '[]'$  Leodis Sias, PharmD '[]'$  Lindell Spar, PharmD '[]'$  Royetta Asal, PharmD '[]'$  Graylin Shiver, Rph '[]'$  Rema Fendt) Glennon Mac, PharmD '[]'$  Arlyn Dunning, PharmD '[]'$  Netta Cedars, PharmD '[]'$  Dia Sitter, PharmD '[]'$  Leone Haven, PharmD '[]'$  Gretta Arab, PharmD '[]'$  Theodis Shove, PharmD '[]'$  Peggyann Juba, PharmD '[x]'$  Eilene Ghazi, PharmD   Positive urine culture Treated with Cephalexin, organism sensitive to the same and no further patient follow-up is required at this time.  Rosie Fate 05/30/2022, 9:53 AM

## 2022-06-01 DIAGNOSIS — L03114 Cellulitis of left upper limb: Secondary | ICD-10-CM | POA: Diagnosis not present

## 2022-06-01 DIAGNOSIS — S51802D Unspecified open wound of left forearm, subsequent encounter: Secondary | ICD-10-CM | POA: Diagnosis not present

## 2022-06-01 DIAGNOSIS — F03911 Unspecified dementia, unspecified severity, with agitation: Secondary | ICD-10-CM | POA: Diagnosis not present

## 2022-06-01 LAB — CULTURE, BLOOD (ROUTINE X 2)
Culture: NO GROWTH
Culture: NO GROWTH
Special Requests: ADEQUATE
Special Requests: ADEQUATE

## 2022-06-02 DIAGNOSIS — T148XXA Other injury of unspecified body region, initial encounter: Secondary | ICD-10-CM | POA: Diagnosis not present

## 2022-06-02 DIAGNOSIS — M79642 Pain in left hand: Secondary | ICD-10-CM | POA: Diagnosis not present

## 2022-06-02 DIAGNOSIS — M25632 Stiffness of left wrist, not elsewhere classified: Secondary | ICD-10-CM | POA: Diagnosis not present

## 2022-06-03 DIAGNOSIS — C4490 Unspecified malignant neoplasm of skin, unspecified: Secondary | ICD-10-CM | POA: Diagnosis not present

## 2022-06-03 DIAGNOSIS — S61412D Laceration without foreign body of left hand, subsequent encounter: Secondary | ICD-10-CM | POA: Diagnosis not present

## 2022-06-03 DIAGNOSIS — D649 Anemia, unspecified: Secondary | ICD-10-CM | POA: Diagnosis not present

## 2022-06-03 DIAGNOSIS — E569 Vitamin deficiency, unspecified: Secondary | ICD-10-CM | POA: Diagnosis not present

## 2022-06-08 DIAGNOSIS — I69998 Other sequelae following unspecified cerebrovascular disease: Secondary | ICD-10-CM | POA: Diagnosis not present

## 2022-06-08 DIAGNOSIS — G301 Alzheimer's disease with late onset: Secondary | ICD-10-CM | POA: Diagnosis not present

## 2022-06-08 DIAGNOSIS — F02B3 Dementia in other diseases classified elsewhere, moderate, with mood disturbance: Secondary | ICD-10-CM | POA: Diagnosis not present

## 2022-06-08 DIAGNOSIS — F331 Major depressive disorder, recurrent, moderate: Secondary | ICD-10-CM | POA: Diagnosis not present

## 2022-06-08 DIAGNOSIS — F01B2 Vascular dementia, moderate, with psychotic disturbance: Secondary | ICD-10-CM | POA: Diagnosis not present

## 2022-06-10 DIAGNOSIS — C4490 Unspecified malignant neoplasm of skin, unspecified: Secondary | ICD-10-CM | POA: Diagnosis not present

## 2022-06-10 DIAGNOSIS — E569 Vitamin deficiency, unspecified: Secondary | ICD-10-CM | POA: Diagnosis not present

## 2022-06-10 DIAGNOSIS — S61412D Laceration without foreign body of left hand, subsequent encounter: Secondary | ICD-10-CM | POA: Diagnosis not present

## 2022-06-10 DIAGNOSIS — D649 Anemia, unspecified: Secondary | ICD-10-CM | POA: Diagnosis not present

## 2022-06-17 DIAGNOSIS — D649 Anemia, unspecified: Secondary | ICD-10-CM | POA: Diagnosis not present

## 2022-06-17 DIAGNOSIS — E569 Vitamin deficiency, unspecified: Secondary | ICD-10-CM | POA: Diagnosis not present

## 2022-06-17 DIAGNOSIS — F03911 Unspecified dementia, unspecified severity, with agitation: Secondary | ICD-10-CM | POA: Diagnosis not present

## 2022-06-17 DIAGNOSIS — Z79899 Other long term (current) drug therapy: Secondary | ICD-10-CM | POA: Diagnosis not present

## 2022-06-17 DIAGNOSIS — C4490 Unspecified malignant neoplasm of skin, unspecified: Secondary | ICD-10-CM | POA: Diagnosis not present

## 2022-06-17 DIAGNOSIS — R52 Pain, unspecified: Secondary | ICD-10-CM | POA: Diagnosis not present

## 2022-06-17 DIAGNOSIS — K219 Gastro-esophageal reflux disease without esophagitis: Secondary | ICD-10-CM | POA: Diagnosis not present

## 2022-06-17 DIAGNOSIS — K5900 Constipation, unspecified: Secondary | ICD-10-CM | POA: Diagnosis not present

## 2022-06-17 DIAGNOSIS — Z6828 Body mass index (BMI) 28.0-28.9, adult: Secondary | ICD-10-CM | POA: Diagnosis not present

## 2022-06-17 DIAGNOSIS — I10 Essential (primary) hypertension: Secondary | ICD-10-CM | POA: Diagnosis not present

## 2022-06-17 DIAGNOSIS — S51802D Unspecified open wound of left forearm, subsequent encounter: Secondary | ICD-10-CM | POA: Diagnosis not present

## 2022-06-17 DIAGNOSIS — S61412D Laceration without foreign body of left hand, subsequent encounter: Secondary | ICD-10-CM | POA: Diagnosis not present

## 2022-06-24 DIAGNOSIS — S61412D Laceration without foreign body of left hand, subsequent encounter: Secondary | ICD-10-CM | POA: Diagnosis not present

## 2022-06-24 DIAGNOSIS — E569 Vitamin deficiency, unspecified: Secondary | ICD-10-CM | POA: Diagnosis not present

## 2022-06-24 DIAGNOSIS — D649 Anemia, unspecified: Secondary | ICD-10-CM | POA: Diagnosis not present

## 2022-06-24 DIAGNOSIS — C4490 Unspecified malignant neoplasm of skin, unspecified: Secondary | ICD-10-CM | POA: Diagnosis not present

## 2022-06-25 DIAGNOSIS — F29 Unspecified psychosis not due to a substance or known physiological condition: Secondary | ICD-10-CM | POA: Diagnosis not present

## 2022-06-25 DIAGNOSIS — F339 Major depressive disorder, recurrent, unspecified: Secondary | ICD-10-CM | POA: Diagnosis not present

## 2022-07-03 DIAGNOSIS — S61412D Laceration without foreign body of left hand, subsequent encounter: Secondary | ICD-10-CM | POA: Diagnosis not present

## 2022-07-03 DIAGNOSIS — D649 Anemia, unspecified: Secondary | ICD-10-CM | POA: Diagnosis not present

## 2022-07-03 DIAGNOSIS — E569 Vitamin deficiency, unspecified: Secondary | ICD-10-CM | POA: Diagnosis not present

## 2022-07-03 DIAGNOSIS — C4490 Unspecified malignant neoplasm of skin, unspecified: Secondary | ICD-10-CM | POA: Diagnosis not present

## 2022-07-10 DIAGNOSIS — E569 Vitamin deficiency, unspecified: Secondary | ICD-10-CM | POA: Diagnosis not present

## 2022-07-10 DIAGNOSIS — S61412D Laceration without foreign body of left hand, subsequent encounter: Secondary | ICD-10-CM | POA: Diagnosis not present

## 2022-07-10 DIAGNOSIS — C4490 Unspecified malignant neoplasm of skin, unspecified: Secondary | ICD-10-CM | POA: Diagnosis not present

## 2022-07-10 DIAGNOSIS — D649 Anemia, unspecified: Secondary | ICD-10-CM | POA: Diagnosis not present

## 2022-07-13 DIAGNOSIS — F01B2 Vascular dementia, moderate, with psychotic disturbance: Secondary | ICD-10-CM | POA: Diagnosis not present

## 2022-07-13 DIAGNOSIS — F02B3 Dementia in other diseases classified elsewhere, moderate, with mood disturbance: Secondary | ICD-10-CM | POA: Diagnosis not present

## 2022-07-13 DIAGNOSIS — I69998 Other sequelae following unspecified cerebrovascular disease: Secondary | ICD-10-CM | POA: Diagnosis not present

## 2022-07-13 DIAGNOSIS — G301 Alzheimer's disease with late onset: Secondary | ICD-10-CM | POA: Diagnosis not present

## 2022-07-13 DIAGNOSIS — F331 Major depressive disorder, recurrent, moderate: Secondary | ICD-10-CM | POA: Diagnosis not present

## 2022-07-14 ENCOUNTER — Ambulatory Visit: Payer: Medicare PPO | Admitting: Psychiatry

## 2022-07-15 DIAGNOSIS — D649 Anemia, unspecified: Secondary | ICD-10-CM | POA: Diagnosis not present

## 2022-07-15 DIAGNOSIS — S61412D Laceration without foreign body of left hand, subsequent encounter: Secondary | ICD-10-CM | POA: Diagnosis not present

## 2022-07-15 DIAGNOSIS — C4490 Unspecified malignant neoplasm of skin, unspecified: Secondary | ICD-10-CM | POA: Diagnosis not present

## 2022-07-15 DIAGNOSIS — M1612 Unilateral primary osteoarthritis, left hip: Secondary | ICD-10-CM | POA: Diagnosis not present

## 2022-07-15 DIAGNOSIS — E569 Vitamin deficiency, unspecified: Secondary | ICD-10-CM | POA: Diagnosis not present

## 2022-07-16 DIAGNOSIS — R2689 Other abnormalities of gait and mobility: Secondary | ICD-10-CM | POA: Diagnosis not present

## 2022-07-16 DIAGNOSIS — M6281 Muscle weakness (generalized): Secondary | ICD-10-CM | POA: Diagnosis not present

## 2022-07-16 DIAGNOSIS — Z9181 History of falling: Secondary | ICD-10-CM | POA: Diagnosis not present

## 2022-07-16 DIAGNOSIS — M25552 Pain in left hip: Secondary | ICD-10-CM | POA: Diagnosis not present

## 2022-07-16 DIAGNOSIS — F03911 Unspecified dementia, unspecified severity, with agitation: Secondary | ICD-10-CM | POA: Diagnosis not present

## 2022-07-21 DIAGNOSIS — S61412D Laceration without foreign body of left hand, subsequent encounter: Secondary | ICD-10-CM | POA: Diagnosis not present

## 2022-07-21 DIAGNOSIS — E569 Vitamin deficiency, unspecified: Secondary | ICD-10-CM | POA: Diagnosis not present

## 2022-07-21 DIAGNOSIS — C4490 Unspecified malignant neoplasm of skin, unspecified: Secondary | ICD-10-CM | POA: Diagnosis not present

## 2022-07-21 DIAGNOSIS — D649 Anemia, unspecified: Secondary | ICD-10-CM | POA: Diagnosis not present

## 2022-07-29 DIAGNOSIS — D649 Anemia, unspecified: Secondary | ICD-10-CM | POA: Diagnosis not present

## 2022-07-29 DIAGNOSIS — E569 Vitamin deficiency, unspecified: Secondary | ICD-10-CM | POA: Diagnosis not present

## 2022-07-29 DIAGNOSIS — S61412D Laceration without foreign body of left hand, subsequent encounter: Secondary | ICD-10-CM | POA: Diagnosis not present

## 2022-07-29 DIAGNOSIS — C4490 Unspecified malignant neoplasm of skin, unspecified: Secondary | ICD-10-CM | POA: Diagnosis not present

## 2022-07-30 DIAGNOSIS — F29 Unspecified psychosis not due to a substance or known physiological condition: Secondary | ICD-10-CM | POA: Diagnosis not present

## 2022-07-30 DIAGNOSIS — F339 Major depressive disorder, recurrent, unspecified: Secondary | ICD-10-CM | POA: Diagnosis not present

## 2022-08-04 DIAGNOSIS — E569 Vitamin deficiency, unspecified: Secondary | ICD-10-CM | POA: Diagnosis not present

## 2022-08-04 DIAGNOSIS — D649 Anemia, unspecified: Secondary | ICD-10-CM | POA: Diagnosis not present

## 2022-08-04 DIAGNOSIS — C4490 Unspecified malignant neoplasm of skin, unspecified: Secondary | ICD-10-CM | POA: Diagnosis not present

## 2022-08-04 DIAGNOSIS — S61412D Laceration without foreign body of left hand, subsequent encounter: Secondary | ICD-10-CM | POA: Diagnosis not present

## 2022-08-10 DIAGNOSIS — F331 Major depressive disorder, recurrent, moderate: Secondary | ICD-10-CM | POA: Diagnosis not present

## 2022-08-10 DIAGNOSIS — F02B3 Dementia in other diseases classified elsewhere, moderate, with mood disturbance: Secondary | ICD-10-CM | POA: Diagnosis not present

## 2022-08-10 DIAGNOSIS — I69998 Other sequelae following unspecified cerebrovascular disease: Secondary | ICD-10-CM | POA: Diagnosis not present

## 2022-08-10 DIAGNOSIS — F01B2 Vascular dementia, moderate, with psychotic disturbance: Secondary | ICD-10-CM | POA: Diagnosis not present

## 2022-08-10 DIAGNOSIS — G301 Alzheimer's disease with late onset: Secondary | ICD-10-CM | POA: Diagnosis not present

## 2022-08-12 DIAGNOSIS — C4490 Unspecified malignant neoplasm of skin, unspecified: Secondary | ICD-10-CM | POA: Diagnosis not present

## 2022-08-12 DIAGNOSIS — D649 Anemia, unspecified: Secondary | ICD-10-CM | POA: Diagnosis not present

## 2022-08-12 DIAGNOSIS — E569 Vitamin deficiency, unspecified: Secondary | ICD-10-CM | POA: Diagnosis not present

## 2022-08-12 DIAGNOSIS — S61412D Laceration without foreign body of left hand, subsequent encounter: Secondary | ICD-10-CM | POA: Diagnosis not present

## 2022-08-19 DIAGNOSIS — C4490 Unspecified malignant neoplasm of skin, unspecified: Secondary | ICD-10-CM | POA: Diagnosis not present

## 2022-08-19 DIAGNOSIS — D649 Anemia, unspecified: Secondary | ICD-10-CM | POA: Diagnosis not present

## 2022-08-19 DIAGNOSIS — S61412D Laceration without foreign body of left hand, subsequent encounter: Secondary | ICD-10-CM | POA: Diagnosis not present

## 2022-08-19 DIAGNOSIS — E569 Vitamin deficiency, unspecified: Secondary | ICD-10-CM | POA: Diagnosis not present

## 2022-08-26 DIAGNOSIS — K5900 Constipation, unspecified: Secondary | ICD-10-CM | POA: Diagnosis not present

## 2022-08-26 DIAGNOSIS — Z6829 Body mass index (BMI) 29.0-29.9, adult: Secondary | ICD-10-CM | POA: Diagnosis not present

## 2022-08-26 DIAGNOSIS — I10 Essential (primary) hypertension: Secondary | ICD-10-CM | POA: Diagnosis not present

## 2022-08-26 DIAGNOSIS — R52 Pain, unspecified: Secondary | ICD-10-CM | POA: Diagnosis not present

## 2022-08-26 DIAGNOSIS — S61412D Laceration without foreign body of left hand, subsequent encounter: Secondary | ICD-10-CM | POA: Diagnosis not present

## 2022-08-26 DIAGNOSIS — K219 Gastro-esophageal reflux disease without esophagitis: Secondary | ICD-10-CM | POA: Diagnosis not present

## 2022-08-26 DIAGNOSIS — D649 Anemia, unspecified: Secondary | ICD-10-CM | POA: Diagnosis not present

## 2022-08-26 DIAGNOSIS — C4490 Unspecified malignant neoplasm of skin, unspecified: Secondary | ICD-10-CM | POA: Diagnosis not present

## 2022-08-26 DIAGNOSIS — F03911 Unspecified dementia, unspecified severity, with agitation: Secondary | ICD-10-CM | POA: Diagnosis not present

## 2022-08-26 DIAGNOSIS — E569 Vitamin deficiency, unspecified: Secondary | ICD-10-CM | POA: Diagnosis not present

## 2022-09-01 DIAGNOSIS — F03911 Unspecified dementia, unspecified severity, with agitation: Secondary | ICD-10-CM | POA: Diagnosis not present

## 2022-09-01 DIAGNOSIS — C4490 Unspecified malignant neoplasm of skin, unspecified: Secondary | ICD-10-CM | POA: Diagnosis not present

## 2022-09-01 DIAGNOSIS — B372 Candidiasis of skin and nail: Secondary | ICD-10-CM | POA: Diagnosis not present

## 2022-09-01 DIAGNOSIS — S61412D Laceration without foreign body of left hand, subsequent encounter: Secondary | ICD-10-CM | POA: Diagnosis not present

## 2022-09-01 DIAGNOSIS — D649 Anemia, unspecified: Secondary | ICD-10-CM | POA: Diagnosis not present

## 2022-09-01 DIAGNOSIS — E569 Vitamin deficiency, unspecified: Secondary | ICD-10-CM | POA: Diagnosis not present

## 2022-09-08 DIAGNOSIS — E569 Vitamin deficiency, unspecified: Secondary | ICD-10-CM | POA: Diagnosis not present

## 2022-09-08 DIAGNOSIS — D72829 Elevated white blood cell count, unspecified: Secondary | ICD-10-CM | POA: Diagnosis not present

## 2022-09-08 DIAGNOSIS — C4490 Unspecified malignant neoplasm of skin, unspecified: Secondary | ICD-10-CM | POA: Diagnosis not present

## 2022-09-08 DIAGNOSIS — D649 Anemia, unspecified: Secondary | ICD-10-CM | POA: Diagnosis not present

## 2022-09-14 DIAGNOSIS — F02B3 Dementia in other diseases classified elsewhere, moderate, with mood disturbance: Secondary | ICD-10-CM | POA: Diagnosis not present

## 2022-09-14 DIAGNOSIS — I69998 Other sequelae following unspecified cerebrovascular disease: Secondary | ICD-10-CM | POA: Diagnosis not present

## 2022-09-14 DIAGNOSIS — F331 Major depressive disorder, recurrent, moderate: Secondary | ICD-10-CM | POA: Diagnosis not present

## 2022-09-14 DIAGNOSIS — F01B2 Vascular dementia, moderate, with psychotic disturbance: Secondary | ICD-10-CM | POA: Diagnosis not present

## 2022-09-14 DIAGNOSIS — G301 Alzheimer's disease with late onset: Secondary | ICD-10-CM | POA: Diagnosis not present

## 2022-09-15 DIAGNOSIS — I10 Essential (primary) hypertension: Secondary | ICD-10-CM | POA: Diagnosis not present

## 2022-09-17 DIAGNOSIS — E569 Vitamin deficiency, unspecified: Secondary | ICD-10-CM | POA: Diagnosis not present

## 2022-09-17 DIAGNOSIS — C4490 Unspecified malignant neoplasm of skin, unspecified: Secondary | ICD-10-CM | POA: Diagnosis not present

## 2022-09-17 DIAGNOSIS — D649 Anemia, unspecified: Secondary | ICD-10-CM | POA: Diagnosis not present

## 2022-09-17 DIAGNOSIS — D72829 Elevated white blood cell count, unspecified: Secondary | ICD-10-CM | POA: Diagnosis not present

## 2022-09-21 DIAGNOSIS — C4490 Unspecified malignant neoplasm of skin, unspecified: Secondary | ICD-10-CM | POA: Diagnosis not present

## 2022-09-21 DIAGNOSIS — D649 Anemia, unspecified: Secondary | ICD-10-CM | POA: Diagnosis not present

## 2022-09-21 DIAGNOSIS — D72829 Elevated white blood cell count, unspecified: Secondary | ICD-10-CM | POA: Diagnosis not present

## 2022-09-21 DIAGNOSIS — E569 Vitamin deficiency, unspecified: Secondary | ICD-10-CM | POA: Diagnosis not present

## 2022-10-01 DIAGNOSIS — C4490 Unspecified malignant neoplasm of skin, unspecified: Secondary | ICD-10-CM | POA: Diagnosis not present

## 2022-10-01 DIAGNOSIS — D649 Anemia, unspecified: Secondary | ICD-10-CM | POA: Diagnosis not present

## 2022-10-01 DIAGNOSIS — D72829 Elevated white blood cell count, unspecified: Secondary | ICD-10-CM | POA: Diagnosis not present

## 2022-10-01 DIAGNOSIS — E569 Vitamin deficiency, unspecified: Secondary | ICD-10-CM | POA: Diagnosis not present

## 2022-10-05 DIAGNOSIS — C4490 Unspecified malignant neoplasm of skin, unspecified: Secondary | ICD-10-CM | POA: Diagnosis not present

## 2022-10-05 DIAGNOSIS — D72829 Elevated white blood cell count, unspecified: Secondary | ICD-10-CM | POA: Diagnosis not present

## 2022-10-05 DIAGNOSIS — E569 Vitamin deficiency, unspecified: Secondary | ICD-10-CM | POA: Diagnosis not present

## 2022-10-05 DIAGNOSIS — D649 Anemia, unspecified: Secondary | ICD-10-CM | POA: Diagnosis not present

## 2022-10-08 DIAGNOSIS — D72829 Elevated white blood cell count, unspecified: Secondary | ICD-10-CM | POA: Diagnosis not present

## 2022-10-08 DIAGNOSIS — D649 Anemia, unspecified: Secondary | ICD-10-CM | POA: Diagnosis not present

## 2022-10-08 DIAGNOSIS — C4490 Unspecified malignant neoplasm of skin, unspecified: Secondary | ICD-10-CM | POA: Diagnosis not present

## 2022-10-08 DIAGNOSIS — E569 Vitamin deficiency, unspecified: Secondary | ICD-10-CM | POA: Diagnosis not present

## 2022-10-19 DIAGNOSIS — F331 Major depressive disorder, recurrent, moderate: Secondary | ICD-10-CM | POA: Diagnosis not present

## 2022-10-19 DIAGNOSIS — F01B2 Vascular dementia, moderate, with psychotic disturbance: Secondary | ICD-10-CM | POA: Diagnosis not present

## 2022-10-19 DIAGNOSIS — G301 Alzheimer's disease with late onset: Secondary | ICD-10-CM | POA: Diagnosis not present

## 2022-10-19 DIAGNOSIS — I69998 Other sequelae following unspecified cerebrovascular disease: Secondary | ICD-10-CM | POA: Diagnosis not present

## 2022-10-19 DIAGNOSIS — F02B3 Dementia in other diseases classified elsewhere, moderate, with mood disturbance: Secondary | ICD-10-CM | POA: Diagnosis not present

## 2022-11-13 DIAGNOSIS — F29 Unspecified psychosis not due to a substance or known physiological condition: Secondary | ICD-10-CM | POA: Diagnosis not present

## 2022-11-13 DIAGNOSIS — F339 Major depressive disorder, recurrent, unspecified: Secondary | ICD-10-CM | POA: Diagnosis not present

## 2022-11-16 DIAGNOSIS — F01B2 Vascular dementia, moderate, with psychotic disturbance: Secondary | ICD-10-CM | POA: Diagnosis not present

## 2022-11-16 DIAGNOSIS — F331 Major depressive disorder, recurrent, moderate: Secondary | ICD-10-CM | POA: Diagnosis not present

## 2022-11-16 DIAGNOSIS — I69998 Other sequelae following unspecified cerebrovascular disease: Secondary | ICD-10-CM | POA: Diagnosis not present

## 2022-11-16 DIAGNOSIS — G301 Alzheimer's disease with late onset: Secondary | ICD-10-CM | POA: Diagnosis not present

## 2022-11-16 DIAGNOSIS — F02B3 Dementia in other diseases classified elsewhere, moderate, with mood disturbance: Secondary | ICD-10-CM | POA: Diagnosis not present

## 2022-12-18 ENCOUNTER — Emergency Department (HOSPITAL_COMMUNITY)
Admission: EM | Admit: 2022-12-18 | Discharge: 2022-12-18 | Disposition: A | Discharge status: Home / Self Care | Payer: Medicare PPO | Attending: Emergency Medicine | Admitting: Emergency Medicine

## 2022-12-18 ENCOUNTER — Emergency Department (HOSPITAL_COMMUNITY): Payer: Medicare PPO

## 2022-12-18 ENCOUNTER — Other Ambulatory Visit: Payer: Self-pay

## 2022-12-18 DIAGNOSIS — M25462 Effusion, left knee: Secondary | ICD-10-CM | POA: Diagnosis not present

## 2022-12-18 DIAGNOSIS — R609 Edema, unspecified: Secondary | ICD-10-CM | POA: Diagnosis not present

## 2022-12-18 DIAGNOSIS — W06XXXA Fall from bed, initial encounter: Secondary | ICD-10-CM | POA: Diagnosis not present

## 2022-12-18 DIAGNOSIS — Z043 Encounter for examination and observation following other accident: Secondary | ICD-10-CM | POA: Diagnosis not present

## 2022-12-18 DIAGNOSIS — M79642 Pain in left hand: Secondary | ICD-10-CM | POA: Diagnosis not present

## 2022-12-18 DIAGNOSIS — S0083XA Contusion of other part of head, initial encounter: Secondary | ICD-10-CM | POA: Diagnosis not present

## 2022-12-18 DIAGNOSIS — F0390 Unspecified dementia without behavioral disturbance: Secondary | ICD-10-CM | POA: Insufficient documentation

## 2022-12-18 DIAGNOSIS — I10 Essential (primary) hypertension: Secondary | ICD-10-CM | POA: Diagnosis not present

## 2022-12-18 DIAGNOSIS — S0990XA Unspecified injury of head, initial encounter: Secondary | ICD-10-CM | POA: Diagnosis not present

## 2022-12-18 DIAGNOSIS — R519 Headache, unspecified: Secondary | ICD-10-CM | POA: Diagnosis present

## 2022-12-18 DIAGNOSIS — G4489 Other headache syndrome: Secondary | ICD-10-CM | POA: Diagnosis not present

## 2022-12-18 DIAGNOSIS — M25532 Pain in left wrist: Secondary | ICD-10-CM | POA: Diagnosis not present

## 2022-12-18 DIAGNOSIS — M25562 Pain in left knee: Secondary | ICD-10-CM | POA: Diagnosis not present

## 2022-12-18 DIAGNOSIS — W19XXXA Unspecified fall, initial encounter: Secondary | ICD-10-CM

## 2022-12-18 DIAGNOSIS — Z79899 Other long term (current) drug therapy: Secondary | ICD-10-CM | POA: Insufficient documentation

## 2022-12-18 DIAGNOSIS — S0000XA Unspecified superficial injury of scalp, initial encounter: Secondary | ICD-10-CM | POA: Diagnosis not present

## 2022-12-18 DIAGNOSIS — R110 Nausea: Secondary | ICD-10-CM | POA: Diagnosis not present

## 2022-12-18 DIAGNOSIS — S80919A Unspecified superficial injury of unspecified knee, initial encounter: Secondary | ICD-10-CM | POA: Diagnosis not present

## 2022-12-18 DIAGNOSIS — R531 Weakness: Secondary | ICD-10-CM | POA: Diagnosis not present

## 2022-12-18 DIAGNOSIS — Z7401 Bed confinement status: Secondary | ICD-10-CM | POA: Diagnosis not present

## 2022-12-18 LAB — I-STAT CHEM 8, ED
BUN: 17 mg/dL (ref 8–23)
Calcium, Ion: 1.19 mmol/L (ref 1.15–1.40)
Chloride: 104 mmol/L (ref 98–111)
Creatinine, Ser: 0.3 mg/dL — ABNORMAL LOW (ref 0.44–1.00)
Glucose, Bld: 101 mg/dL — ABNORMAL HIGH (ref 70–99)
HCT: 37 % (ref 36.0–46.0)
Hemoglobin: 12.6 g/dL (ref 12.0–15.0)
Potassium: 3.9 mmol/L (ref 3.5–5.1)
Sodium: 139 mmol/L (ref 135–145)
TCO2: 29 mmol/L (ref 22–32)

## 2022-12-18 LAB — I-STAT BETA HCG BLOOD, ED (MC, WL, AP ONLY): I-stat hCG, quantitative: 10.7 m[IU]/mL — ABNORMAL HIGH (ref <5)

## 2022-12-18 NOTE — ED Triage Notes (Signed)
Pt arrived via [facility] from [facility] for an unwitnessed fall 2 days ago. Pt is complaining of headache. She has a hx of dementia and she has fresh scrapes on both kneew and a bruise on the right side of forehead.   BP 112/72  HR 80  O2 93 RA  CBG 113

## 2022-12-18 NOTE — ED Provider Notes (Signed)
[facility] EMERGENCY DEPARTMENT AT [facility] Provider Note   CSN: [id number] Arrival date & time: 12/18/22  1008     History  Chief Complaint  Patient presents with   [name]    Sherolyn Pearson is a 82 y.o. female.  82 year old female who fell 2 days ago from her bed.  History of dementia.  She does not take any blood thinners.  Patient monitored at her facility but continues to note pain to her left knee, left wrist and hand.  Also notes right sided headache.  Does have a bruise to the area.  Has otherwise been at her baseline.  Sent here for evaluation        Home Medications Prior to Admission medications   Medication Sig Start Date End Date Taking? Authorizing Provider  acetaminophen (TYLENOL) 325 MG tablet Take 650 mg by mouth every 8 (eight) hours as needed for mild pain.    [provider]  ascorbic acid (VITAMIN C) 1000 MG tablet Take 1 tablet (1,000 mg total) by mouth daily. 08/07/21   Osvaldo Shipper, MD  azithromycin (ZITHROMAX) 500 MG tablet Take by mouth daily. X 7 days 05/18/22   [provider]  carvedilol (COREG) 3.125 MG tablet Take 1 tablet (3.125 mg total) by mouth 2 (two) times daily with a meal. 08/07/21   Osvaldo Shipper, MD  cholecalciferol (VITAMIN D) 25 MCG tablet Take 2 tablets (2,000 Units total) by mouth 2 (two) times daily. Patient taking differently: Take 1,000 Units by mouth See admin instructions. X 10 days ( started on 05-19-22) 08/07/21   Osvaldo Shipper, MD  dexamethasone (DECADRON) 6 MG tablet Take 6 mg by mouth daily. X 5 days - started on 05-18-22 05/18/22   [provider]  divalproex (DEPAKOTE SPRINKLE) 125 MG capsule Take 2 capsules (250 mg total) by mouth every 12 (twelve) hours. 05/22/22 06/21/22  Earney Navy, NP  divalproex (DEPAKOTE SPRINKLE) 125 MG capsule Take 125-250 mg by mouth See admin instructions. Taking 125 mg in the Am and 250 mg ( 2 caps) in the afternoon    [provider]   docusate sodium (COLACE) 100 MG capsule Take 1 capsule (100 mg total) by mouth 2 (two) times daily. 08/07/21   Osvaldo Shipper, MD  Ensure (ENSURE) Take 237 mLs by mouth 3 (three) times daily between meals.    [provider]  gabapentin (NEURONTIN) 100 MG capsule Take 2 capsules (200 mg total) by mouth 3 (three) times daily. 08/07/21   Osvaldo Shipper, MD  HYDROcodone-acetaminophen (NORCO/VICODIN) 5-325 MG tablet Take 1-2 tablets by mouth every 4 (four) hours as needed for moderate pain or severe pain. 08/07/21   Osvaldo Shipper, MD  LAGEVRIO 200 MG CAPS capsule Take 4 capsules by mouth 2 (two) times daily. X 5 days 05/18/22   [provider]  levocetirizine (XYZAL) 5 MG tablet Take 5 mg by mouth daily as needed for allergies.    [provider]  OLANZapine zydis (ZYPREXA) 5 MG disintegrating tablet Take 0.5 tablets (2.5 mg total) by mouth every 8 (eight) hours as needed (severe agitation). 05/22/22 06/21/22  Earney Navy, NP  pantoprazole (PROTONIX) 40 MG tablet Take 1 tablet (40 mg total) by mouth 2 (two) times daily before a meal. 08/07/21   Osvaldo Shipper, MD  polyethylene glycol (MIRALAX / GLYCOLAX) 17 g packet Take 17 g by mouth daily as needed for mild constipation. 08/07/21   Osvaldo Shipper, MD  PRESCRIPTION MEDICATION Apply 1 Application topically every  12 (twelve) hours as needed (for behavior related issues  ( apply to shoulder and neck)).    [provider]  senna (SENOKOT) 8.6 MG TABS tablet Take 1 tablet (8.6 mg total) by mouth 2 (two) times daily. 08/07/21   Osvaldo Shipper, MD  sertraline (ZOLOFT) 50 MG tablet Take 50 mg by mouth daily.    [provider]  zinc gluconate 50 MG tablet Take 50 mg by mouth daily. X 10 days    [provider]      Allergies    Molds & smuts, Other, and Statins    Review of Systems   Review of Systems  All other systems reviewed and are negative.   Physical Exam Updated Vital Signs BP 133/84  (BP Location: Right Arm)   Pulse 75   Temp 98 F (36.7 C) (Oral)   Resp 18   SpO2 93%  Physical Exam Vitals and nursing note reviewed.  Constitutional:      General: She is not in acute distress.    Appearance: Normal appearance. She is well-developed. She is not toxic-appearing.  HENT:     Head: Contusion present.   Eyes:     General: Lids are normal.     Conjunctiva/sclera: Conjunctivae normal.     Pupils: Pupils are equal, round, and reactive to light.  Neck:     Thyroid: No thyroid mass.     Trachea: No tracheal deviation.  Cardiovascular:     Rate and Rhythm: Normal rate and regular rhythm.     Heart sounds: Normal heart sounds. No murmur heard.    No gallop.  Pulmonary:     Effort: Pulmonary effort is normal. No respiratory distress.     Breath sounds: Normal breath sounds. No stridor. No decreased breath sounds, wheezing, rhonchi or rales.  Abdominal:     General: There is no distension.     Palpations: Abdomen is soft.     Tenderness: There is no abdominal tenderness. There is no rebound.  Musculoskeletal:        General: Normal range of motion.     Left wrist: Tenderness present. Normal range of motion.     Cervical back: Normal range of motion and neck supple.     Left knee: Swelling present. No deformity.  Skin:    General: Skin is warm and dry.     Findings: No abrasion or rash.  Neurological:     Mental Status: She is alert and oriented to person, place, and time. Mental status is at baseline.     GCS: GCS eye subscore is 4. GCS verbal subscore is 5. GCS motor subscore is 6.     Cranial Nerves: No cranial nerve deficit.     Sensory: No sensory deficit.     Motor: No tremor.  Psychiatric:        Attention and Perception: Attention normal.        Speech: Speech normal.        Behavior: Behavior normal.     ED Results / Procedures / Treatments   Labs (all labs ordered are listed, but only abnormal results are displayed) Labs Reviewed  I-STAT BETA HCG  BLOOD, ED ([facility], [facility], [facility] ONLY)    EKG None  Radiology No results found.  Procedures Procedures    Medications Ordered in ED Medications - No data to display  ED Course/ Medical Decision Making/ A&P  Medical Decision Making Amount and/or Complexity of Data Reviewed Radiology: ordered. ECG/medicine tests: ordered.   CT of head and cervical spine for interpretation shows no acute findings.  Plain x-rays of left knee and left upper extremity negative.  I-STAT shows normal electrolytes.  As well as hemoglobin stable.  Will discharge back to her facility        Final Clinical Impression(s) / ED Diagnoses Final diagnoses:  None    Rx / DC Orders ED Discharge Orders     None         Lorre Nick, MD 12/18/22 1209

## 2022-12-18 NOTE — ED Notes (Signed)
Spoke with [facility] about transportation back to [facility]

## 2022-12-25 DIAGNOSIS — E569 Vitamin deficiency, unspecified: Secondary | ICD-10-CM | POA: Diagnosis not present

## 2022-12-25 DIAGNOSIS — F411 Generalized anxiety disorder: Secondary | ICD-10-CM | POA: Diagnosis not present

## 2022-12-28 DIAGNOSIS — F01B2 Vascular dementia, moderate, with psychotic disturbance: Secondary | ICD-10-CM | POA: Diagnosis not present

## 2022-12-28 DIAGNOSIS — I69998 Other sequelae following unspecified cerebrovascular disease: Secondary | ICD-10-CM | POA: Diagnosis not present

## 2022-12-28 DIAGNOSIS — F331 Major depressive disorder, recurrent, moderate: Secondary | ICD-10-CM | POA: Diagnosis not present

## 2022-12-28 DIAGNOSIS — G301 Alzheimer's disease with late onset: Secondary | ICD-10-CM | POA: Diagnosis not present

## 2022-12-28 DIAGNOSIS — F02B3 Dementia in other diseases classified elsewhere, moderate, with mood disturbance: Secondary | ICD-10-CM | POA: Diagnosis not present

## 2023-01-18 NOTE — Progress Notes (Signed)
Lab was ordered incorrectly. Was supposed to be an Public librarian chem 8

## 2023-01-22 DIAGNOSIS — E785 Hyperlipidemia, unspecified: Secondary | ICD-10-CM | POA: Diagnosis not present

## 2023-01-22 DIAGNOSIS — I10 Essential (primary) hypertension: Secondary | ICD-10-CM | POA: Diagnosis not present

## 2023-01-25 DIAGNOSIS — F01B2 Vascular dementia, moderate, with psychotic disturbance: Secondary | ICD-10-CM | POA: Diagnosis not present

## 2023-01-25 DIAGNOSIS — G301 Alzheimer's disease with late onset: Secondary | ICD-10-CM | POA: Diagnosis not present

## 2023-01-25 DIAGNOSIS — I69998 Other sequelae following unspecified cerebrovascular disease: Secondary | ICD-10-CM | POA: Diagnosis not present

## 2023-01-25 DIAGNOSIS — F02B3 Dementia in other diseases classified elsewhere, moderate, with mood disturbance: Secondary | ICD-10-CM | POA: Diagnosis not present

## 2023-01-25 DIAGNOSIS — F411 Generalized anxiety disorder: Secondary | ICD-10-CM | POA: Diagnosis not present

## 2023-01-26 DIAGNOSIS — E7800 Pure hypercholesterolemia, unspecified: Secondary | ICD-10-CM | POA: Diagnosis not present

## 2023-01-26 IMAGING — CR DG HUMERUS 2V *L*
2 series · 2 of 2 positions shown · non-contrast
Comparison: None.

CLINICAL DATA: Fall

EXAM:
LEFT HUMERUS - 2+ VIEW

[humerus ap]
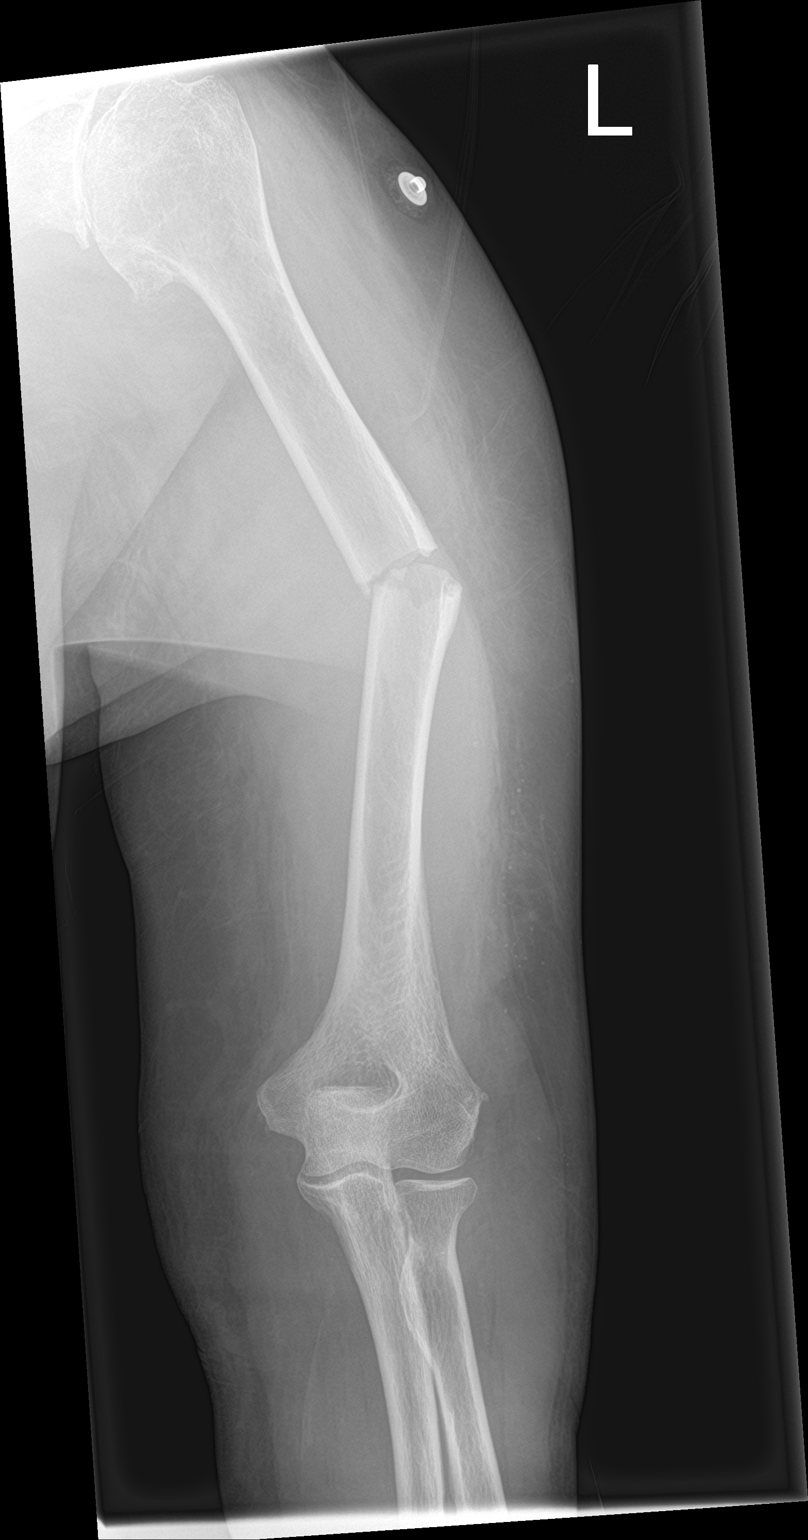

[humerus lat]
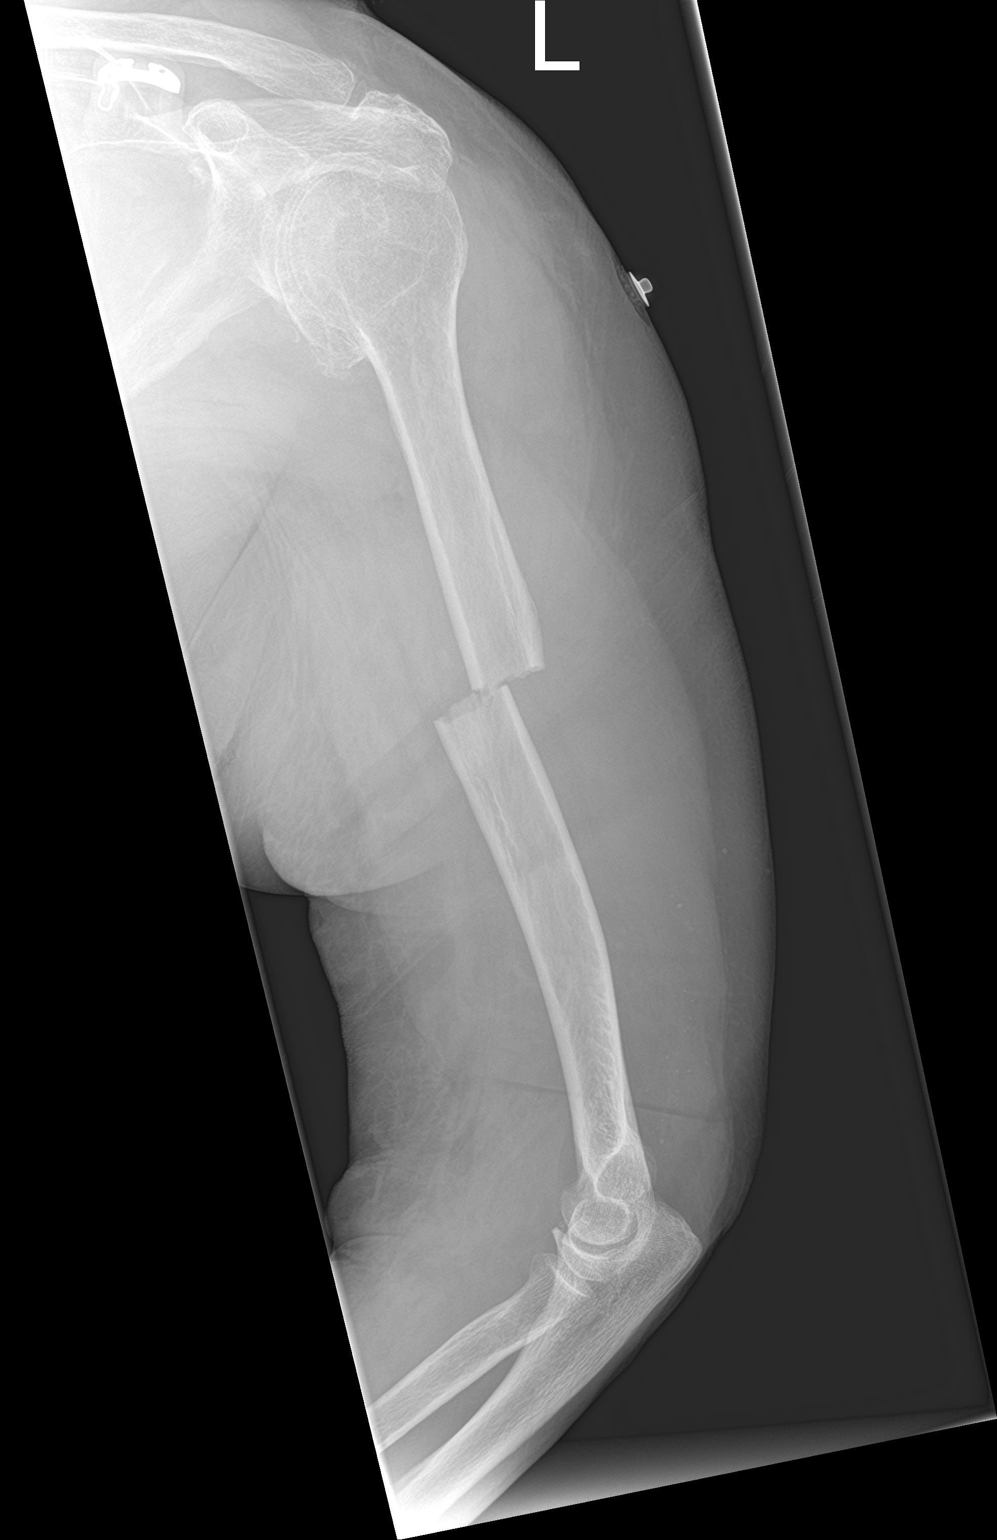

[2 of 2 positions shown; findings below may reference images not displayed]

FINDINGS: Acute transverse fracture mid shaft of left humerus with angulation
and mild displacement.

Advanced degenerative changes left shoulder joint.
IMPRESSION: Acute fracture left mid humerus.

## 2023-01-26 IMAGING — RF DG HUMERUS 2V *L*
1 series · 7 of 7 positions shown · non-contrast
Comparison: Radiographs dated 07/27/2021.

CLINICAL DATA: Internal fixation of the left humerus.

EXAM:
LEFT HUMERUS - 2+ VIEW

[Series 1: run · 7 of 7 slices shown]
[im 1/7]
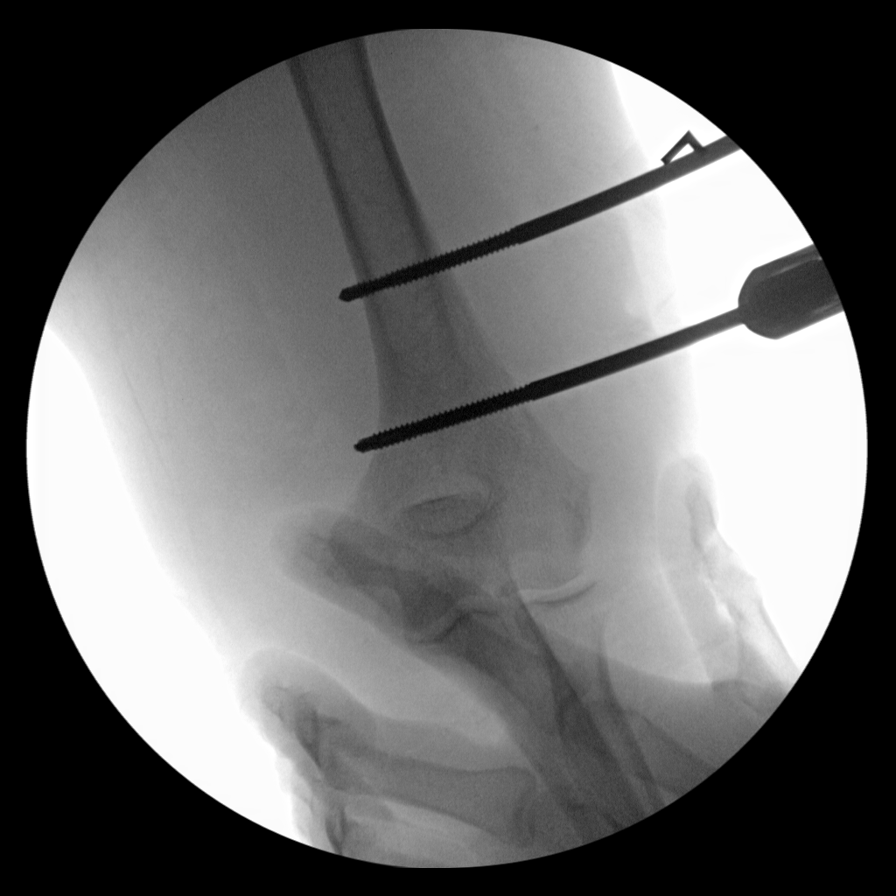
[im 2/7]
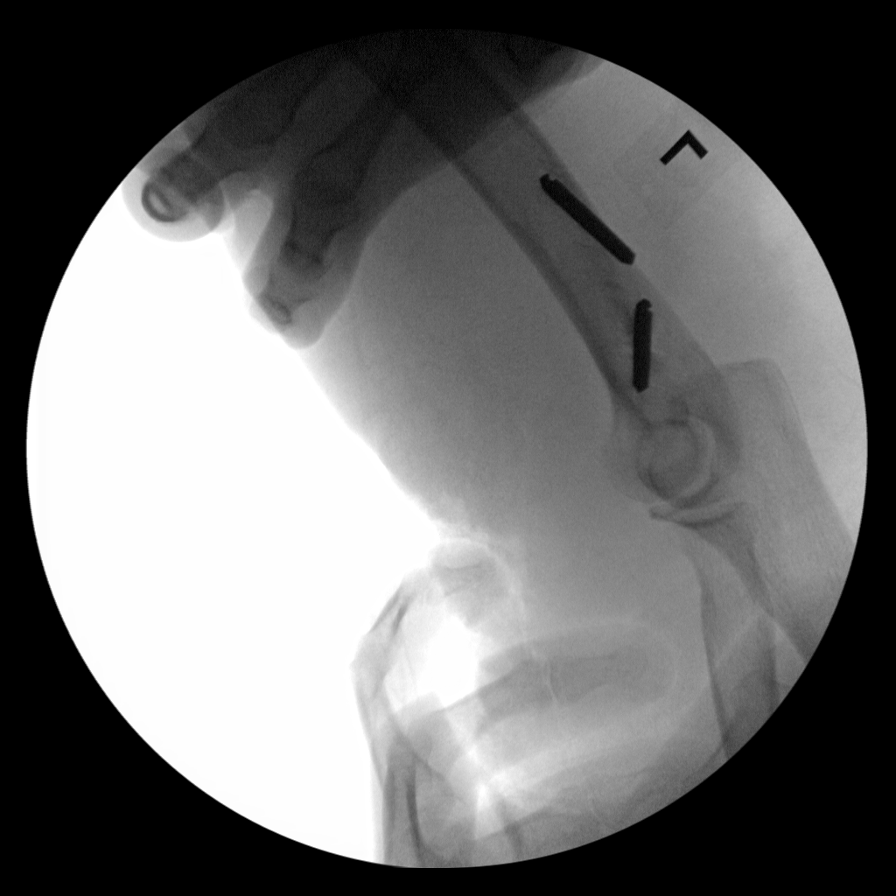
[im 3/7]
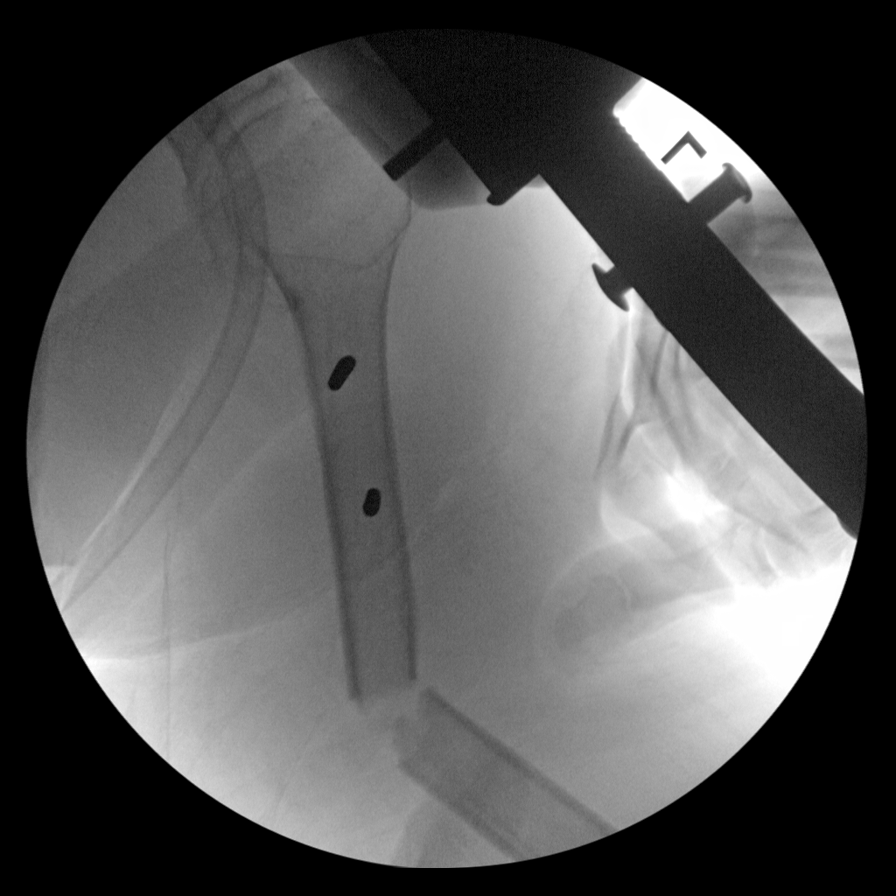
[im 4/7]
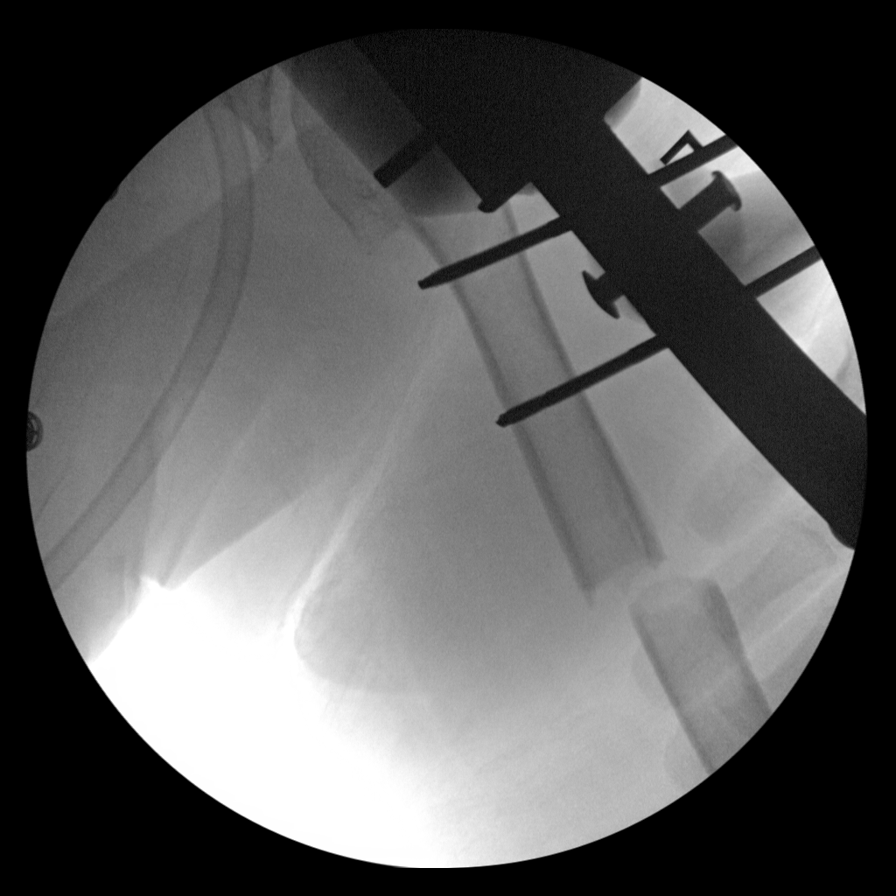
[im 5/7]
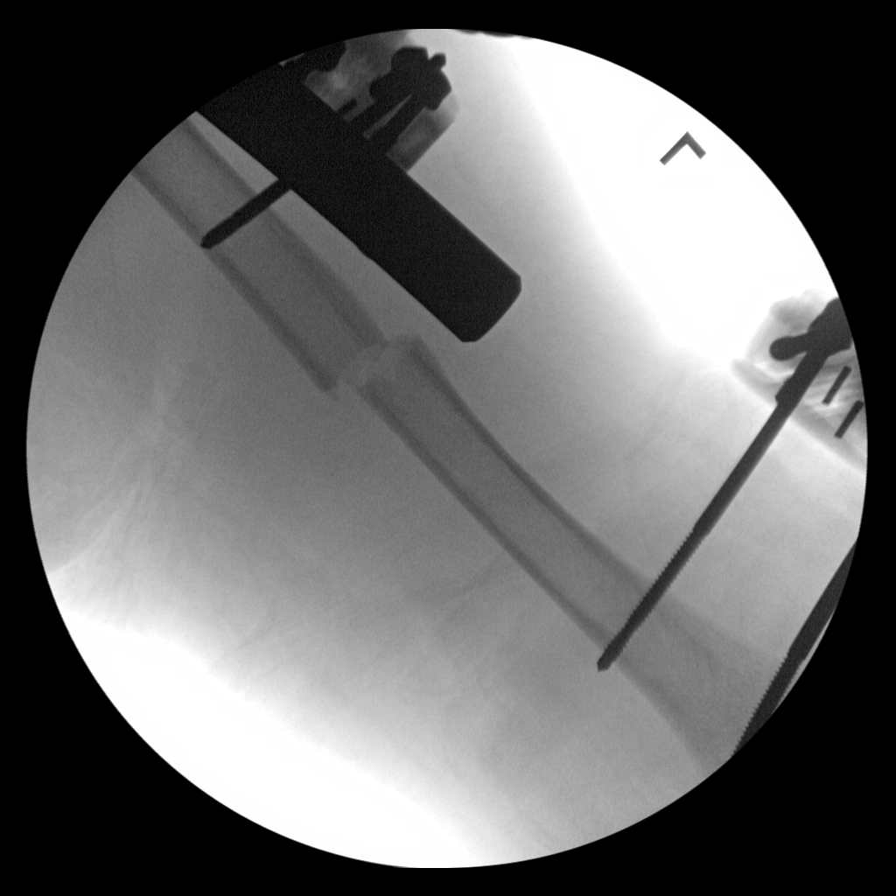
[im 6/7]
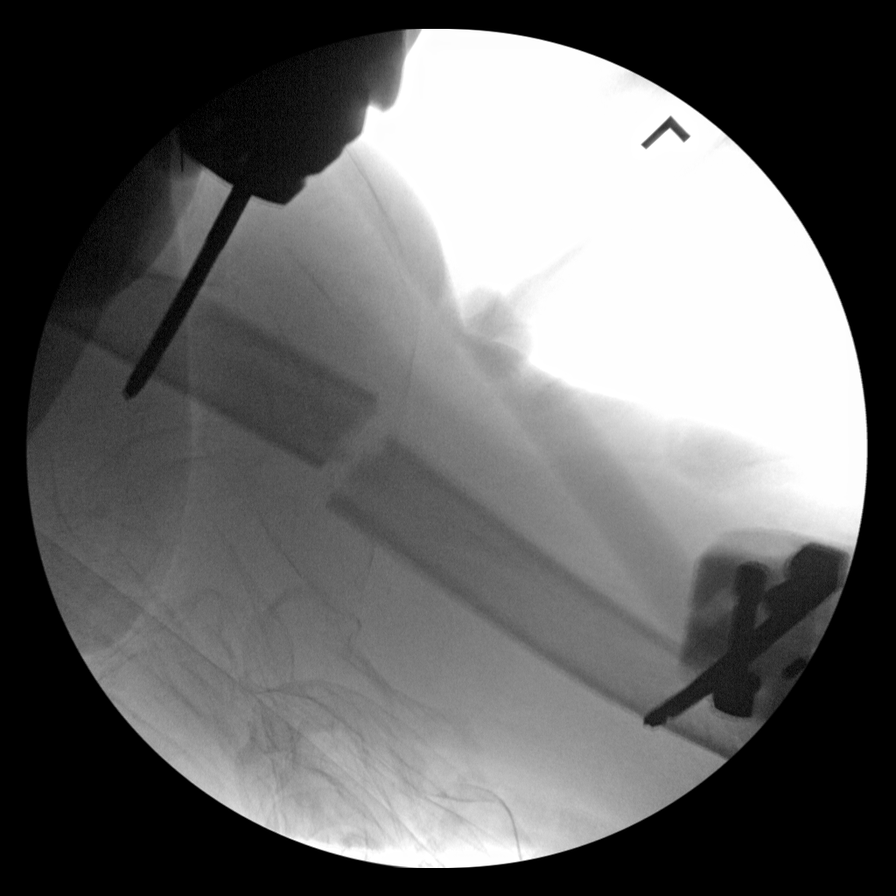
[im 7/7]
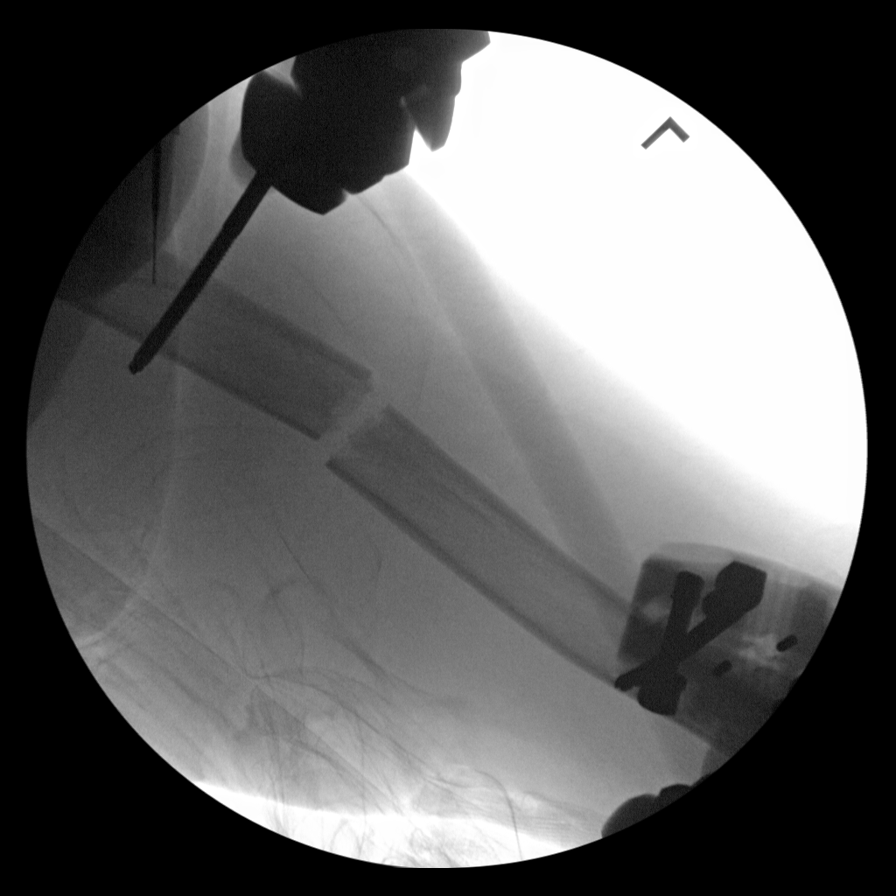

[7 of 7 positions shown; findings below may reference images not displayed]

FINDINGS: Seven intraoperative fluoroscopic images provided. The total
fluoroscopic time is 1 minutes 15 seconds with total air kerma of
1.89 mGy. There has been internal fixation of left humeral fracture.
IMPRESSION: Intraoperative fluoroscopic images of left humeral fixation.

## 2023-01-26 IMAGING — DX DG HUMERUS 2V *L*
2 series · 2 of 2 positions shown · non-contrast
Comparison: None.

CLINICAL DATA: Left humeral fracture, postoperative examination

EXAM:
LEFT HUMERUS - 2+ VIEW

[humerus ap]
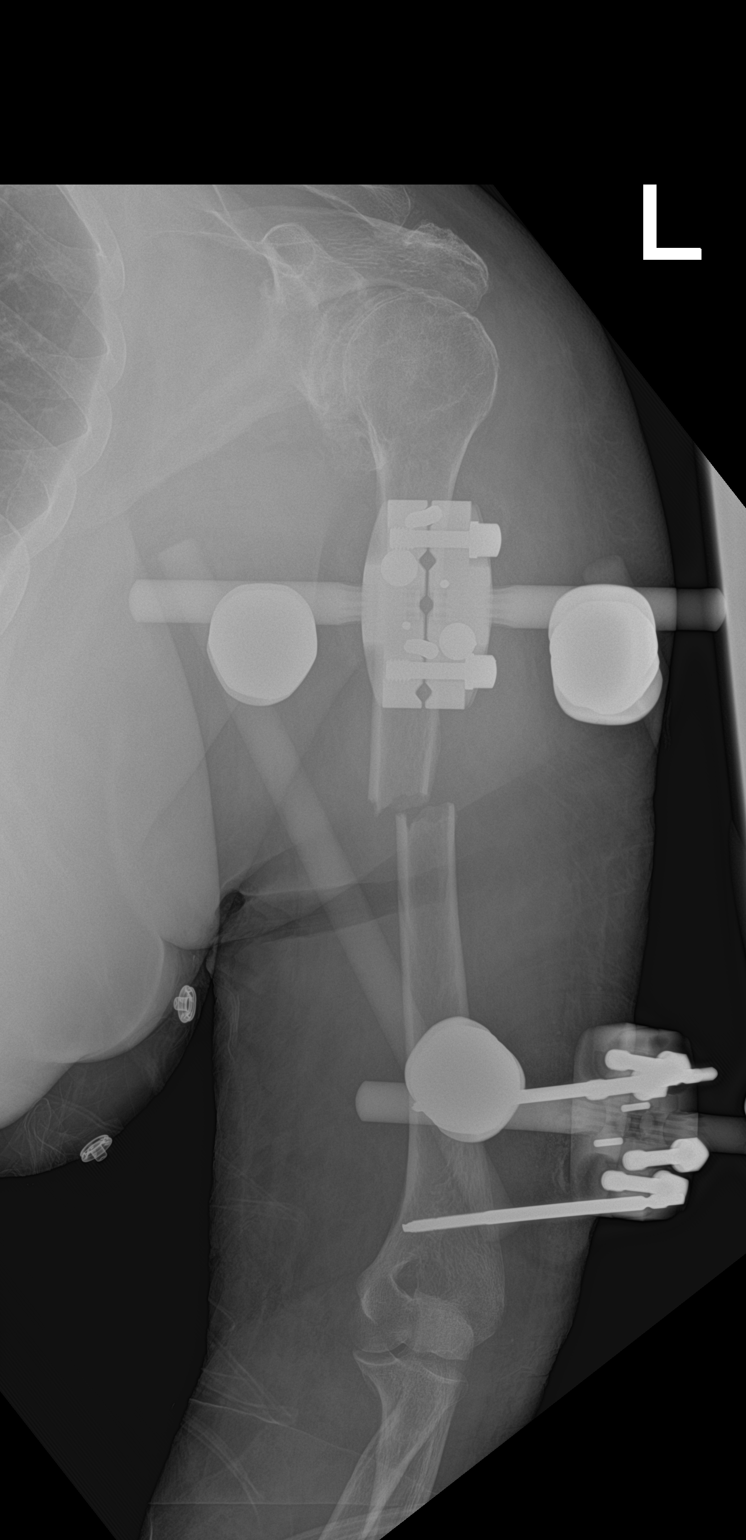

[humerus lat]
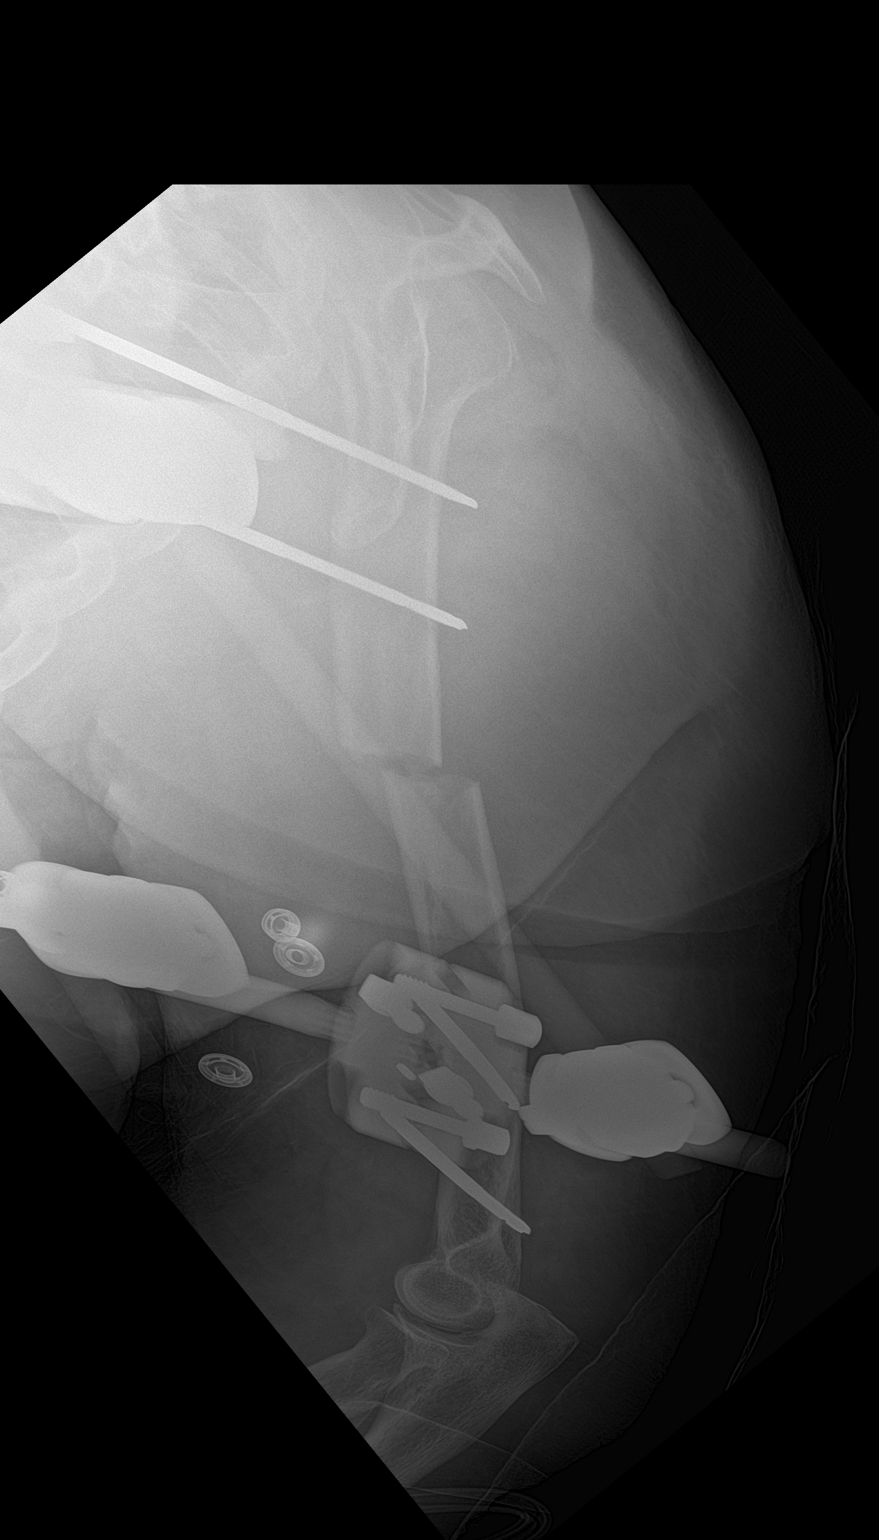

[2 of 2 positions shown; findings below may reference images not displayed]

FINDINGS: Acute transverse fracture of the a mid diaphysis of the left humerus
is again identified. External fixator has been applied. There is [DATE]
shaft with posterolateral displacement of the distal fracture
fragment and distraction of the fracture fragments by approximately
3-4 mm. Normal alignment.
IMPRESSION: Interval external fixation of mid diaphyseal left humeral fracture
as described above.

## 2023-02-22 DIAGNOSIS — F02B3 Dementia in other diseases classified elsewhere, moderate, with mood disturbance: Secondary | ICD-10-CM | POA: Diagnosis not present

## 2023-02-22 DIAGNOSIS — G301 Alzheimer's disease with late onset: Secondary | ICD-10-CM | POA: Diagnosis not present

## 2023-02-22 DIAGNOSIS — I69998 Other sequelae following unspecified cerebrovascular disease: Secondary | ICD-10-CM | POA: Diagnosis not present

## 2023-02-22 DIAGNOSIS — F01B2 Vascular dementia, moderate, with psychotic disturbance: Secondary | ICD-10-CM | POA: Diagnosis not present

## 2023-03-16 DIAGNOSIS — F411 Generalized anxiety disorder: Secondary | ICD-10-CM | POA: Diagnosis not present

## 2023-03-16 DIAGNOSIS — F331 Major depressive disorder, recurrent, moderate: Secondary | ICD-10-CM | POA: Diagnosis not present

## 2023-03-16 DIAGNOSIS — I69998 Other sequelae following unspecified cerebrovascular disease: Secondary | ICD-10-CM | POA: Diagnosis not present

## 2023-03-16 DIAGNOSIS — F01B2 Vascular dementia, moderate, with psychotic disturbance: Secondary | ICD-10-CM | POA: Diagnosis not present

## 2023-04-19 DIAGNOSIS — G301 Alzheimer's disease with late onset: Secondary | ICD-10-CM | POA: Diagnosis not present

## 2023-04-19 DIAGNOSIS — I69998 Other sequelae following unspecified cerebrovascular disease: Secondary | ICD-10-CM | POA: Diagnosis not present

## 2023-04-19 DIAGNOSIS — F02B3 Dementia in other diseases classified elsewhere, moderate, with mood disturbance: Secondary | ICD-10-CM | POA: Diagnosis not present

## 2023-04-19 DIAGNOSIS — F01B2 Vascular dementia, moderate, with psychotic disturbance: Secondary | ICD-10-CM | POA: Diagnosis not present

## 2023-04-19 DIAGNOSIS — F411 Generalized anxiety disorder: Secondary | ICD-10-CM | POA: Diagnosis not present

## 2023-04-29 DIAGNOSIS — D72829 Elevated white blood cell count, unspecified: Secondary | ICD-10-CM | POA: Diagnosis not present

## 2023-04-29 DIAGNOSIS — C4490 Unspecified malignant neoplasm of skin, unspecified: Secondary | ICD-10-CM | POA: Diagnosis not present

## 2023-04-29 DIAGNOSIS — D649 Anemia, unspecified: Secondary | ICD-10-CM | POA: Diagnosis not present

## 2023-04-29 DIAGNOSIS — E569 Vitamin deficiency, unspecified: Secondary | ICD-10-CM | POA: Diagnosis not present

## 2023-05-19 DIAGNOSIS — F331 Major depressive disorder, recurrent, moderate: Secondary | ICD-10-CM | POA: Diagnosis not present

## 2023-05-19 DIAGNOSIS — F411 Generalized anxiety disorder: Secondary | ICD-10-CM | POA: Diagnosis not present

## 2023-05-19 DIAGNOSIS — I69998 Other sequelae following unspecified cerebrovascular disease: Secondary | ICD-10-CM | POA: Diagnosis not present

## 2023-05-19 DIAGNOSIS — F01518 Vascular dementia, unspecified severity, with other behavioral disturbance: Secondary | ICD-10-CM | POA: Diagnosis not present

## 2023-06-14 DIAGNOSIS — I69998 Other sequelae following unspecified cerebrovascular disease: Secondary | ICD-10-CM | POA: Diagnosis not present

## 2023-06-14 DIAGNOSIS — F411 Generalized anxiety disorder: Secondary | ICD-10-CM | POA: Diagnosis not present

## 2023-06-14 DIAGNOSIS — F331 Major depressive disorder, recurrent, moderate: Secondary | ICD-10-CM | POA: Diagnosis not present

## 2023-06-14 DIAGNOSIS — F01518 Vascular dementia, unspecified severity, with other behavioral disturbance: Secondary | ICD-10-CM | POA: Diagnosis not present

## 2023-07-07 DEATH — deceased
